# Patient Record
Sex: Female | Born: 1955
Health system: Southern US, Community
[De-identification: ages and names within clinical notes are randomized; demographics above are authoritative.]

## PROBLEM LIST (undated history)

## (undated) DIAGNOSIS — R002 Palpitations: Secondary | ICD-10-CM

## (undated) DIAGNOSIS — F319 Bipolar disorder, unspecified: Secondary | ICD-10-CM

## (undated) DIAGNOSIS — F4310 Post-traumatic stress disorder, unspecified: Secondary | ICD-10-CM

## (undated) DIAGNOSIS — E119 Type 2 diabetes mellitus without complications: Secondary | ICD-10-CM

## (undated) DIAGNOSIS — I10 Essential (primary) hypertension: Secondary | ICD-10-CM

## (undated) DIAGNOSIS — K279 Peptic ulcer, site unspecified, unspecified as acute or chronic, without hemorrhage or perforation: Secondary | ICD-10-CM

## (undated) DIAGNOSIS — F32A Depression, unspecified: Secondary | ICD-10-CM

## (undated) DIAGNOSIS — K2951 Unspecified chronic gastritis with bleeding: Secondary | ICD-10-CM

## (undated) DIAGNOSIS — R51 Headache: Secondary | ICD-10-CM

## (undated) DIAGNOSIS — F329 Major depressive disorder, single episode, unspecified: Secondary | ICD-10-CM

## (undated) HISTORY — DX: Depression, unspecified: F32.A

## (undated) HISTORY — DX: Major depressive disorder, single episode, unspecified: F32.9

## (undated) HISTORY — PX: KNEE SURGERY: SHX244

## (undated) HISTORY — DX: Peptic ulcer, site unspecified, unspecified as acute or chronic, without hemorrhage or perforation: K27.9

## (undated) HISTORY — PX: BREAST BIOPSY: SHX20

## (undated) HISTORY — DX: Palpitations: R00.2

## (undated) HISTORY — PX: SHOULDER SURGERY: SHX246

## (undated) HISTORY — DX: Headache: R51

## (undated) HISTORY — DX: Essential (primary) hypertension: I10

## (undated) HISTORY — PX: TONSILLECTOMY: SUR1361

## (undated) HISTORY — DX: Post-traumatic stress disorder, unspecified: F43.10

## (undated) HISTORY — DX: Bipolar disorder, unspecified: F31.9

---

## 2006-11-24 ENCOUNTER — Ambulatory Visit: Payer: Self-pay | Admitting: Cardiology

## 2006-11-25 ENCOUNTER — Ambulatory Visit: Payer: Self-pay

## 2007-01-14 DIAGNOSIS — F339 Major depressive disorder, recurrent, unspecified: Secondary | ICD-10-CM | POA: Insufficient documentation

## 2007-01-14 DIAGNOSIS — G894 Chronic pain syndrome: Secondary | ICD-10-CM | POA: Insufficient documentation

## 2007-01-14 DIAGNOSIS — H8109 Meniere's disease, unspecified ear: Secondary | ICD-10-CM | POA: Insufficient documentation

## 2007-01-14 DIAGNOSIS — Z8711 Personal history of peptic ulcer disease: Secondary | ICD-10-CM | POA: Insufficient documentation

## 2007-01-14 DIAGNOSIS — J309 Allergic rhinitis, unspecified: Secondary | ICD-10-CM | POA: Insufficient documentation

## 2007-01-14 DIAGNOSIS — I10 Essential (primary) hypertension: Secondary | ICD-10-CM | POA: Insufficient documentation

## 2007-02-06 ENCOUNTER — Ambulatory Visit: Payer: Self-pay | Admitting: Cardiology

## 2007-03-01 DIAGNOSIS — M545 Low back pain, unspecified: Secondary | ICD-10-CM | POA: Insufficient documentation

## 2008-11-03 ENCOUNTER — Emergency Department: Payer: Self-pay | Admitting: Emergency Medicine

## 2008-11-11 ENCOUNTER — Emergency Department: Payer: Self-pay | Admitting: Emergency Medicine

## 2009-03-26 ENCOUNTER — Ambulatory Visit: Payer: Self-pay | Admitting: Family Medicine

## 2009-04-07 ENCOUNTER — Encounter: Payer: Self-pay | Admitting: Cardiovascular Disease

## 2009-04-07 LAB — CONVERTED CEMR LAB
ALT: 19 units/L
Albumin: 4.3 g/dL
Alkaline Phosphatase: 67 units/L
BUN: 11 mg/dL
Basophils Relative: 1 %
CO2: 26 meq/L
Chloride: 102 meq/L
Creatinine, Ser: 0.78 mg/dL
Eosinophils Relative: 3 %
HCT: 43.3 %
Monocytes Relative: 10 %
Neutrophils Relative %: 50 %
RBC: 4.78 M/uL
RDW: 11.9 %
Total Bilirubin: 0.6 mg/dL
WBC: 6.3 10*3/uL

## 2009-04-10 LAB — HPV, HIGH-RISK: HPV DNA High Risk: ABNORMAL

## 2009-04-10 LAB — HM PAP SMEAR: HM PAP: ABNORMAL

## 2009-05-09 ENCOUNTER — Ambulatory Visit: Payer: Self-pay | Admitting: Internal Medicine

## 2009-05-24 ENCOUNTER — Ambulatory Visit: Payer: Self-pay | Admitting: Family Medicine

## 2009-06-30 ENCOUNTER — Encounter: Payer: Self-pay | Admitting: Cardiovascular Disease

## 2009-06-30 ENCOUNTER — Emergency Department: Payer: Self-pay | Admitting: Emergency Medicine

## 2009-06-30 LAB — CONVERTED CEMR LAB
CO2: 31 meq/L
Calcium: 8.8 mg/dL
Chloride: 103 meq/L
Creatinine, Ser: 0.81 mg/dL
HCT: 41.5 %
MCV: 91 fL
Potassium: 3.5 meq/L
RDW: 12.2 %
Sodium: 141 meq/L

## 2009-07-06 ENCOUNTER — Ambulatory Visit: Payer: Self-pay | Admitting: Family Medicine

## 2009-07-11 ENCOUNTER — Encounter: Payer: Self-pay | Admitting: Cardiovascular Disease

## 2009-07-11 DIAGNOSIS — R0602 Shortness of breath: Secondary | ICD-10-CM | POA: Insufficient documentation

## 2009-07-13 ENCOUNTER — Ambulatory Visit: Payer: Self-pay | Admitting: Cardiology

## 2009-07-13 DIAGNOSIS — R079 Chest pain, unspecified: Secondary | ICD-10-CM | POA: Insufficient documentation

## 2009-07-14 ENCOUNTER — Encounter: Payer: Self-pay | Admitting: Cardiology

## 2009-08-08 ENCOUNTER — Telehealth (INDEPENDENT_AMBULATORY_CARE_PROVIDER_SITE_OTHER): Payer: Self-pay | Admitting: Radiology

## 2010-01-18 ENCOUNTER — Emergency Department: Payer: Self-pay | Admitting: Emergency Medicine

## 2010-01-31 ENCOUNTER — Ambulatory Visit: Payer: Self-pay | Admitting: Family Medicine

## 2010-04-15 ENCOUNTER — Ambulatory Visit: Payer: Self-pay | Admitting: Internal Medicine

## 2010-04-17 NOTE — Assessment & Plan Note (Signed)
Summary: ROV/AMD   Visit Type:  Follow-up Primary Provider:  Dr. Nilda Simmer  CC:  SOB (constant); some CP.  History of Present Illness: 55 yo with history of bipolar disorder, atypical chest pain, and palpitations presents for evaluation of chest pain.  She has been having episodes of chest pain for over a month now.  She feels like it is a soreness with a choking sensation that is there constantly though it waxes and wanes.  She went to the ER at [facility] about 10 days ago.  ECG was unremarkable and cardiac enyzmes were normal. She has had a chest CTA that was negative for PE or pneumonia.  The chest soreness is not worse with exertion or meals.  She wheezes occasionally and has been using albuterol, which may be helping some.  She had a prior similar episode back in 2008.  At that time, she had an unremarkable echo and was supposed to have a myoview done but apparently did not followup.  She continues to have occasional chest fluttering.  She had a PVC on ECG today and had a past holter with PVCs.  She quit smoking in 1/11 and reports a nearly 40 lb weight gain since that time.   ECG: NSR with PVC  Labs (4/11): HCT 43, K 4.2, creatinine 0.78, LDL 145, HDL 56  Current Medications (verified): 1)  Clonazepam 1 Mg Tabs (Clonazepam) .Marland Kitchen.. 1-2 Daily & 2 At Bedtime 2)  Abilify 15 Mg Tabs (Aripiprazole) .... At Bedtime 3)  Simvastatin 20 Mg Tabs (Simvastatin) .... Take One Tablet By Mouth Daily At Bedtime 4)  Citalopram Hydrobromide 20 Mg Tabs (Citalopram Hydrobromide) .... Once Daily 5)  Loratadine-D 24hr 10-240 Mg Xr24h-Tab (Loratadine-Pseudoephedrine) .... Once Daily 6)  Flonase 50 Mcg/act Susp (Fluticasone Propionate) .... As Directed 7)  Diclofenac Sodium 75 Mg Tbec (Diclofenac Sodium) .... As Directed 8)  Ranitidine Hcl 150 Mg Caps (Ranitidine Hcl) .Marland Kitchen.. 1 By Mouth Two Times A Day 9)  Proventil Hfa 108 (90 Base) Mcg/act Aers (Albuterol Sulfate) .... As Directed 10)  Dexilant 60 Mg Cpdr  (Dexlansoprazole) .... Once Daily 11)  Vitamin B Complex-C   Caps (B Complex-C) .... Once in Awhile 12)  Aspirin Ec 325 Mg Tbec (Aspirin) .... Take Only With Cp  Allergies (verified): 1)  ! Pcn  Past History:  Past Medical History: 1. Allergic rhinitis 2. osteoarthritis 3. depression 4. headaches 5. hypertension 6. hyperlipidemia 7. DDD lumber spine 8. menopause 2009 9. Palpitations: holter monitor in 2008 showed PVCs 10. PTSD 11. Atypical chest pain: Worked up in 2008 by Dr. Jens Som, was supposed to get [facility] but apparently did not followup.  Echo showed EF 60%, mild LVH, normal valves 12. Bipolar disorder 13. GERD  Family History: Father: living 68: cancer Mother: Died at 54 after MI.  Sister with palpitations  Social History: Disabled (back pain) Originally from [place] Has a daughter Tobacco Use - Former, quit in 1/11.  Alcohol Use - yes Regular Exercise - no  Review of Systems       All systems reviewed and negative except as per HPI.   Vital Signs:  Patient profile:   55 year old female Height:      68 inches Weight:      245 pounds BMI:     37.39 O2 Sat:      98 % on Room air Pulse rate:   66 / minute BP sitting:   144 / 82  (left arm) Cuff size:  regular  Vitals Entered By: Charlena Cross, RN, BSN (July 13, 2009 2:44 PM)  O2 Flow:  Room air  Physical Exam  General:  Well developed, well nourished, in no acute distress. Obese. Head:  normocephalic and atraumatic Nose:  no deformity, discharge, inflammation, or lesions Mouth:  Teeth, gums and palate normal. Oral mucosa normal. Neck:  Neck supple, no JVD. No masses, thyromegaly or abnormal cervical nodes. Lungs:  Clear bilaterally to auscultation and percussion. Heart:  Non-displaced PMI, chest non-tender; regular rate and rhythm, S1, S2 without murmurs, rubs or gallops. Carotid upstroke normal, no bruit. Pedals normal pulses. No edema, no varicosities. Abdomen:  Bowel sounds positive;  abdomen soft and non-tender without masses, organomegaly, or hernias noted. No hepatosplenomegaly. Msk:  Back normal, normal gait. Muscle strength and tone normal. Extremities:  No clubbing or cyanosis. Neurologic:  Alert and oriented x 3. Skin:  Intact without lesions or rashes. Psych:  Normal affect.   Impression & Recommendations:  Problem # 1:  CHEST PAIN-UNSPECIFIED (ICD-786.50) Atypical chest pain.  Risk factors are recent smoking (quit 1/11), hyperlipidemia, and HTN.  She had a similar episode in 2008 and was supposed to have a myoview but did not followup.  Given improvement in symptoms with albuterol, I wonder if there could be a component of allergic asthma.  I do think that it would be reasonable to risk stratify her.  Will get an ETT-myoview.  She should take ASA 81 mg daily (can stop if myoview is normal).    Problem # 2:  HYPERTENSION, BENIGN (ICD-401.1) Carries diagnosis of HTN but no meds.  BP is mildly elevated today.  Needs careful followup.   Other Orders: Nuclear Stress Test (Nuc Stress Test)  Patient Instructions: 1)  Your physician recommends that you schedule a follow-up appointment in: 2 weeks with Dr. Mariah Milling 2)  Your physician has recommended you make the following change in your medication: aspirin 81 mg daily 3)  Your physician has requested that you have an exercise stress myoview.  For further information please visit [email or web address]  Please follow instruction sheet, as given.

## 2010-04-17 NOTE — Progress Notes (Signed)
Summary: Cancelling Myoview Study  Phone Note Call from Patient   Caller: Patient Summary of Call: Pt. called today and cancelled Myoview sched. for 08/09/09 due to illness. She stated she would call back to resched. when better. She also cancelled the F/U appt. with Dr. Mariah Milling on 5/31 and said that she would call back to resched. that appts. also.      W.Trenity Pha, RT-N Initial call taken by: Leonia Corona, RT-N,  Aug 08, 2009 1:30 PM

## 2010-04-18 ENCOUNTER — Ambulatory Visit: Payer: Self-pay | Admitting: Family Medicine

## 2010-05-23 ENCOUNTER — Ambulatory Visit: Payer: Self-pay | Admitting: Family Medicine
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
## 2010-08-27 ENCOUNTER — Encounter: Payer: Self-pay | Admitting: Cardiovascular Disease

## 2011-01-03 ENCOUNTER — Ambulatory Visit: Payer: Self-pay | Admitting: Family Medicine

## 2011-05-09 ENCOUNTER — Ambulatory Visit: Payer: Self-pay | Admitting: Internal Medicine

## 2011-05-11 LAB — BETA STREP CULTURE(ARMC)

## 2011-08-28 ENCOUNTER — Ambulatory Visit: Payer: Self-pay | Admitting: Family Medicine

## 2011-08-30 ENCOUNTER — Emergency Department: Payer: Self-pay | Admitting: Unknown Physician Specialty

## 2011-08-30 LAB — COMPREHENSIVE METABOLIC PANEL
Anion Gap: 12 (ref 7–16)
BUN: 12 mg/dL (ref 7–18)
Bilirubin,Total: 0.4 mg/dL (ref 0.2–1.0)
Calcium, Total: 8.6 mg/dL (ref 8.5–10.1)
Chloride: 107 mmol/L (ref 98–107)
Co2: 21 mmol/L (ref 21–32)
Creatinine: 0.82 mg/dL (ref 0.60–1.30)
EGFR (African American): >60
Glucose: 137 mg/dL — ABNORMAL HIGH (ref 65–99)
Osmolality: 281 (ref 275–301)
Potassium: 3.6 mmol/L (ref 3.5–5.1)
SGOT(AST): 23 U/L (ref 15–37)
SGPT (ALT): 48 U/L

## 2011-08-30 LAB — MAGNESIUM: Magnesium: 1.8 mg/dL

## 2011-08-30 LAB — CBC
HCT: 43.4 % (ref 35.0–47.0)
HGB: 15 g/dL (ref 12.0–16.0)
MCH: 31.7 pg (ref 26.0–34.0)
MCHC: 34.5 g/dL (ref 32.0–36.0)
Platelet: 236 10*3/uL (ref 150–440)
RDW: 12.4 % (ref 11.5–14.5)

## 2011-08-30 LAB — CK TOTAL AND CKMB (NOT AT ARMC)
CK, Total: 89 U/L (ref 21–215)
CK-MB: 1.6 ng/mL (ref 0.5–3.6)

## 2011-08-30 LAB — APTT: Activated PTT: 28.6 secs (ref 23.6–35.9)

## 2011-08-30 LAB — LIPASE, BLOOD: Lipase: 65 U/L — ABNORMAL LOW (ref 73–393)

## 2011-09-01 ENCOUNTER — Inpatient Hospital Stay: Payer: Self-pay | Admitting: Internal Medicine

## 2011-09-01 LAB — COMPREHENSIVE METABOLIC PANEL
Alkaline Phosphatase: 78 U/L (ref 50–136)
Bilirubin,Total: 0.4 mg/dL (ref 0.2–1.0)
Calcium, Total: 8.8 mg/dL (ref 8.5–10.1)
Co2: 25 mmol/L (ref 21–32)
Creatinine: 0.73 mg/dL (ref 0.60–1.30)
Glucose: 159 mg/dL — ABNORMAL HIGH (ref 65–99)
Osmolality: 285 (ref 275–301)
SGOT(AST): 23 U/L (ref 15–37)
SGPT (ALT): 43 U/L
Total Protein: 7 g/dL (ref 6.4–8.2)

## 2011-09-01 LAB — CBC
HCT: 42.4 % (ref 35.0–47.0)
HGB: 14.9 g/dL (ref 12.0–16.0)
MCHC: 35.2 g/dL (ref 32.0–36.0)
MCV: 91 fL (ref 80–100)
Platelet: 237 10*3/uL (ref 150–440)
RDW: 12.6 % (ref 11.5–14.5)

## 2011-09-01 LAB — CK TOTAL AND CKMB (NOT AT ARMC)
CK, Total: 65 U/L (ref 21–215)
CK-MB: 1.4 ng/mL (ref 0.5–3.6)

## 2011-09-02 LAB — CBC WITH DIFFERENTIAL/PLATELET
Basophil %: 0.6 %
Eosinophil %: 0.1 %
HCT: 43.9 % (ref 35.0–47.0)
HGB: 15.1 g/dL (ref 12.0–16.0)
MCH: 32 pg (ref 26.0–34.0)
MCHC: 34.5 g/dL (ref 32.0–36.0)
MCV: 93 fL (ref 80–100)
Monocyte %: 2.9 %
Neutrophil %: 82 %
Platelet: 234 10*3/uL (ref 150–440)
RBC: 4.74 10*6/uL (ref 3.80–5.20)
WBC: 8.1 10*3/uL (ref 3.6–11.0)

## 2011-09-02 LAB — BASIC METABOLIC PANEL
BUN: 13 mg/dL (ref 7–18)
Calcium, Total: 8.7 mg/dL (ref 8.5–10.1)
Co2: 26 mmol/L (ref 21–32)
Creatinine: 1.01 mg/dL (ref 0.60–1.30)
EGFR (African American): >60
Glucose: 250 mg/dL — ABNORMAL HIGH (ref 65–99)
Osmolality: 290 (ref 275–301)
Sodium: 141 mmol/L (ref 136–145)

## 2011-09-02 LAB — TROPONIN I
Troponin-I: <0.02 ng/mL
Troponin-I: <0.02 ng/mL

## 2011-09-02 LAB — LIPID PANEL
Cholesterol: 183 mg/dL (ref 0–200)
Triglycerides: 214 mg/dL — ABNORMAL HIGH (ref 0–200)
VLDL Cholesterol, Calc: 43 mg/dL — ABNORMAL HIGH (ref 5–40)

## 2011-10-23 ENCOUNTER — Encounter: Payer: Self-pay | Admitting: Pulmonary Disease

## 2011-10-24 ENCOUNTER — Institutional Professional Consult (permissible substitution): Payer: Self-pay | Admitting: Pulmonary Disease

## 2011-11-27 ENCOUNTER — Telehealth: Payer: Self-pay

## 2011-11-27 NOTE — Telephone Encounter (Signed)
Patient is calling to get a refill of losartan 50 mg tabs that she takes once daily. She is a patient of Dr. Kirtland Bouchard. [facility] but has never been seen at our office. I informed her that I was unsure of whether or not we could help her because she hasn't been seen here and she understood. Her pharmacy gave her 3 tabs and she has 1 left. She uses [facility] in [place] and can be reached at [phone].

## 2011-11-28 NOTE — Telephone Encounter (Signed)
Please call ---- I am unable to provide refills until patient has appointment at [facility]; she should call [facility] for refills; they are happy to provide refills until she establishes here.  KMS

## 2011-11-28 NOTE — Telephone Encounter (Signed)
I have called patient to advise. She said the pharmacy has been advised to send request there, and  She will call the office in [place] also.

## 2012-07-21 ENCOUNTER — Other Ambulatory Visit: Payer: Self-pay | Admitting: Family Medicine

## 2012-07-25 ENCOUNTER — Ambulatory Visit: Payer: Self-pay | Admitting: Emergency Medicine

## 2012-07-27 LAB — BETA STREP CULTURE(ARMC)

## 2012-09-02 ENCOUNTER — Ambulatory Visit: Payer: Self-pay | Admitting: Family Medicine

## 2013-04-27 ENCOUNTER — Ambulatory Visit: Payer: Self-pay | Admitting: Family Medicine

## 2013-04-28 ENCOUNTER — Ambulatory Visit: Payer: Self-pay | Admitting: Family Medicine

## 2013-06-02 LAB — HM PAP SMEAR: HM PAP: NEGATIVE

## 2013-12-29 DIAGNOSIS — R7689 Other specified abnormal immunological findings in serum: Secondary | ICD-10-CM | POA: Insufficient documentation

## 2013-12-29 DIAGNOSIS — R768 Other specified abnormal immunological findings in serum: Secondary | ICD-10-CM | POA: Insufficient documentation

## 2013-12-29 DIAGNOSIS — M1990 Unspecified osteoarthritis, unspecified site: Secondary | ICD-10-CM | POA: Insufficient documentation

## 2014-03-20 ENCOUNTER — Ambulatory Visit: Payer: Self-pay | Admitting: Physician Assistant

## 2014-03-20 DIAGNOSIS — J329 Chronic sinusitis, unspecified: Secondary | ICD-10-CM | POA: Diagnosis not present

## 2014-03-20 DIAGNOSIS — E780 Pure hypercholesterolemia: Secondary | ICD-10-CM | POA: Diagnosis not present

## 2014-03-20 DIAGNOSIS — J40 Bronchitis, not specified as acute or chronic: Secondary | ICD-10-CM | POA: Diagnosis not present

## 2014-03-20 DIAGNOSIS — F17210 Nicotine dependence, cigarettes, uncomplicated: Secondary | ICD-10-CM | POA: Diagnosis not present

## 2014-03-20 DIAGNOSIS — F319 Bipolar disorder, unspecified: Secondary | ICD-10-CM | POA: Diagnosis not present

## 2014-03-20 DIAGNOSIS — I10 Essential (primary) hypertension: Secondary | ICD-10-CM | POA: Diagnosis not present

## 2014-05-10 ENCOUNTER — Ambulatory Visit: Payer: Self-pay | Admitting: Internal Medicine

## 2014-05-10 DIAGNOSIS — Y999 Unspecified external cause status: Secondary | ICD-10-CM | POA: Diagnosis not present

## 2014-05-10 DIAGNOSIS — Y929 Unspecified place or not applicable: Secondary | ICD-10-CM | POA: Diagnosis not present

## 2014-05-10 DIAGNOSIS — F329 Major depressive disorder, single episode, unspecified: Secondary | ICD-10-CM | POA: Diagnosis not present

## 2014-05-10 DIAGNOSIS — Z79899 Other long term (current) drug therapy: Secondary | ICD-10-CM | POA: Diagnosis not present

## 2014-05-10 DIAGNOSIS — M25572 Pain in left ankle and joints of left foot: Secondary | ICD-10-CM | POA: Diagnosis not present

## 2014-05-10 DIAGNOSIS — F319 Bipolar disorder, unspecified: Secondary | ICD-10-CM | POA: Diagnosis not present

## 2014-05-10 DIAGNOSIS — G8929 Other chronic pain: Secondary | ICD-10-CM | POA: Diagnosis not present

## 2014-05-10 DIAGNOSIS — M25562 Pain in left knee: Secondary | ICD-10-CM | POA: Diagnosis not present

## 2014-05-10 DIAGNOSIS — Y9389 Activity, other specified: Secondary | ICD-10-CM | POA: Diagnosis not present

## 2014-05-20 DIAGNOSIS — Z0000 Encounter for general adult medical examination without abnormal findings: Secondary | ICD-10-CM | POA: Diagnosis not present

## 2014-05-20 DIAGNOSIS — J449 Chronic obstructive pulmonary disease, unspecified: Secondary | ICD-10-CM | POA: Diagnosis not present

## 2014-05-20 DIAGNOSIS — R7309 Other abnormal glucose: Secondary | ICD-10-CM | POA: Diagnosis not present

## 2014-05-20 DIAGNOSIS — Z23 Encounter for immunization: Secondary | ICD-10-CM | POA: Diagnosis not present

## 2014-05-20 DIAGNOSIS — L918 Other hypertrophic disorders of the skin: Secondary | ICD-10-CM | POA: Diagnosis not present

## 2014-05-20 DIAGNOSIS — I10 Essential (primary) hypertension: Secondary | ICD-10-CM | POA: Diagnosis not present

## 2014-05-20 DIAGNOSIS — E780 Pure hypercholesterolemia: Secondary | ICD-10-CM | POA: Diagnosis not present

## 2014-06-08 DIAGNOSIS — Z0000 Encounter for general adult medical examination without abnormal findings: Secondary | ICD-10-CM | POA: Diagnosis not present

## 2014-06-08 DIAGNOSIS — R109 Unspecified abdominal pain: Secondary | ICD-10-CM | POA: Diagnosis not present

## 2014-06-08 DIAGNOSIS — E780 Pure hypercholesterolemia: Secondary | ICD-10-CM | POA: Diagnosis not present

## 2014-06-08 DIAGNOSIS — Z23 Encounter for immunization: Secondary | ICD-10-CM | POA: Diagnosis not present

## 2014-06-08 DIAGNOSIS — R7309 Other abnormal glucose: Secondary | ICD-10-CM | POA: Diagnosis not present

## 2014-06-08 DIAGNOSIS — R5383 Other fatigue: Secondary | ICD-10-CM | POA: Diagnosis not present

## 2014-06-08 DIAGNOSIS — L918 Other hypertrophic disorders of the skin: Secondary | ICD-10-CM | POA: Diagnosis not present

## 2014-06-09 DIAGNOSIS — R7309 Other abnormal glucose: Secondary | ICD-10-CM | POA: Diagnosis not present

## 2014-06-09 DIAGNOSIS — R5383 Other fatigue: Secondary | ICD-10-CM | POA: Diagnosis not present

## 2014-06-09 DIAGNOSIS — Z1322 Encounter for screening for metabolic disorder: Secondary | ICD-10-CM | POA: Diagnosis not present

## 2014-06-09 DIAGNOSIS — R109 Unspecified abdominal pain: Secondary | ICD-10-CM | POA: Diagnosis not present

## 2014-06-09 LAB — BASIC METABOLIC PANEL
BUN: 11 mg/dL (ref 4–21)
CREATININE: 0.8 mg/dL (ref 0.5–1.1)
GLUCOSE: 144 mg/dL
POTASSIUM: 4 mmol/L (ref 3.4–5.3)
SODIUM: 142 mmol/L (ref 137–147)

## 2014-06-09 LAB — HEMOGLOBIN A1C: HEMOGLOBIN A1C: 6.9 % — AB (ref 4.0–6.0)

## 2014-06-09 LAB — CBC AND DIFFERENTIAL
HEMATOCRIT: 44 % (ref 36–46)
Hemoglobin: 15.1 g/dL (ref 12.0–16.0)
Platelets: 180 10*3/uL (ref 150–399)
WBC: 7 10^3/mL

## 2014-06-09 LAB — HEPATIC FUNCTION PANEL
ALT: 31 U/L (ref 7–35)
AST: 21 U/L (ref 13–35)

## 2014-06-09 LAB — LIPID PANEL
Cholesterol: 185 mg/dL (ref 0–200)
HDL: 37 mg/dL (ref 35–70)
LDL CALC: 120 mg/dL
TRIGLYCERIDES: 139 mg/dL (ref 40–160)

## 2014-06-09 LAB — TSH: TSH: 2.29 u[IU]/mL (ref 0.41–5.90)

## 2014-07-01 ENCOUNTER — Ambulatory Visit
Admit: 2014-07-01 | Disposition: A | Discharge status: Home / Self Care | Payer: Self-pay | Attending: Family Medicine | Admitting: Family Medicine

## 2014-07-01 DIAGNOSIS — E119 Type 2 diabetes mellitus without complications: Secondary | ICD-10-CM | POA: Diagnosis not present

## 2014-07-10 NOTE — H&P (Signed)
PATIENT NAME:  Misty Bishop, Misty Bishop MR#:  [id number] DATE OF BIRTH:  06-10-1955  DATE OF ADMISSION:  09/01/2011  PRIMARY CARE PHYSICIAN: Reginia Forts, MD  CHIEF COMPLAINT: Blood pressure up and down.   HISTORY OF PRESENT ILLNESS: This is a 59 year old female who has been having chest pain in bilateral chest, like a band across her chest and around to the back. It has been coming and going for the past month. Nothing makes it better or worse. The left side seems more constant than the other. She has been having dizziness, a headache, recent upper respiratory tract infection, and started on prednisone and doxycycline. She was recently in the emergency room and had a CT scan of the chest, abdomen, and pelvis which was unrevealing. She has been taking some Benadryl, Mucinex D, and some pseudoephedrine, but has not been taking pseudoephedrine since Thursday. She has been having some palpitations, some cough, shortness of breath, and wheezing. In the ER, she passed out and hospitalist services were contacted for further evaluation.   PAST MEDICAL HISTORY:  1. Chronic back pain and neck pain. 2. Hypertension. 3. History of bleeding ulcer in the past.   PAST SURGICAL HISTORY:  1. Cesarean section.  2. Tonsillectomy.  3. Lumpectomy, left breast, benign.  4. Left shoulder surgery. 5. Knee surgery.   ALLERGIES: Penicillin and Levaquin.   MEDICATIONS:  1. Loratadine 10 mg daily p.r.n.  2. Ranitidine 150 mg twice a day p.r.n.  3. Diclofenac p.r.n.  4. Prednisolone taper. 5. Doxycycline 100 mg twice a day. 6. Guaifenesin p.r.n.   SOCIAL HISTORY: Smoker - 3/4 pack for the past year. No alcohol. No drug use. Is on disability.   FAMILY HISTORY: Mother with heart disease, diabetes, and chronic obstructive pulmonary disease. Father died of metastatic melanoma.   REVIEW OF SYSTEMS: CONSTITUTIONAL: Positive for headache. Positive for sweats. No fever or chills. Positive for fatigue for the past six weeks.  EYES: She does wear glasses and has light sensitivity and some blurred vision. EARS, NOSE, MOUTH, AND THROAT: Decreased hearing. Positive for congestion in the nose. Positive for sore throat. CARDIOVASCULAR: Positive for chest pain. Positive for palpitations. RESPIRATORY: Positive for shortness of breath, cough, and clear phlegm. Positive for wheezing. GASTROINTESTINAL: Positive for nausea here in the emergency room. Upper abdominal pain. Positive for constipation. No bright red blood per rectum. No melena. GENITOURINARY: No burning on urination or hematuria. MUSCULOSKELETAL: Positive for joint pain all over, left arm and neck especially. INTEGUMENT: No rashes or eruptions. NEUROLOGIC: Positive for passing out in the emergency room. PSYCHIATRIC: Positive for anxiety. ENDOCRINE: No thyroid problems. HEMATOLOGIC/LYMPHATIC: No anemia. No easy bruising or bleeding.   PHYSICAL EXAMINATION:   VITAL SIGNS: Temperature 98.7, pulse 70, respirations 18, blood pressure 164/88, and pulse oximetry 96%. On presentation though the blood pressure was 178/119.   GENERAL: No respiratory distress.   EYES: Conjunctivae and lids normal. Pupils equal, round, and reactive to light. Extraocular muscles intact.   EARS, NOSE, MOUTH, AND THROAT: Tympanic membrane on the right bulging and cloudy. Tympanic membrane on the left erythematous. Nasal mucosa with enlarged turbinates. Throat with slight erythema, no exudates. Lips and gums with no lesions.   NECK: No JVD. No bruits. No lymphadenopathy. No thyromegaly. No thyroid nodules palpated.   RESPIRATORY: Lungs have decreased breath sounds bilaterally. Coughing with deep breath. Scattered wheeze and rhonchi.   HEART: S1 and S2 normal No gallops, rubs, or murmurs heard. Carotid upstroke 2+ bilaterally. No bruits. Dorsalis pedis pulses 2+  bilaterally. No edema of the lower extremities.   ABDOMEN: Soft. Tenderness in the upper abdomen.   MUSCULOSKELETAL: Positive for pain to  palpation over the back, from the thoracic spine around, mostly on left side, coming around the left breast, underneath the left breast.   SKIN: No rashes.   NEUROLOGIC: Cranial nerves II through XII grossly intact. Deep tendon reflexes 2+ bilateral lower extremities.   PSYCHIATRIC: The patient is oriented to person, place, and time.   LABORATORY, DIAGNOSTIC AND RADIOLOGIC DATA: White blood cell count 7.9, hemoglobin and hematocrit 14.9 and 42.4, and platelet count 237. Glucose 159, BUN 13, creatinine 0.73, sodium 141, potassium 4.0, chloride 108, CO2 25, and calcium 8.8. Liver function tests normal range. Troponin negative.   Chest x-ray: Findings consistent with chronic obstructive pulmonary disease.  EKG: Normal sinus rhythm, sinus arrhythmia, 64 beats per minute, flipped T waves laterally.   ASSESSMENT AND PLAN:  1. Malignant hypertension: The patient did get a dose of labetalol in the emergency room. Blood pressure is still elevated. We will start Norvasc 5 mg daily and titrate if needed.  2. Back pain, chest pain, and abdominal pain: This is probably thoracic spine related. We will get a MRI of the thoracic spine. We will put on IV Solu-Medrol to break down any inflammation.  3. Upper respiratory tract infection and chronic obstructive pulmonary disease exacerbation: We will put on nebulizer treatments. The patient is already on doxycycline. We will add Solu-Medrol.  4. Anxiety.  5. Gastroesophageal reflux disease: PRN ranitidine.  6. Tobacco abuse: Smoking cessation counseling done, three minutes by me. Nicotine patch applied.   TIME SPENT ON ADMISSION: 55 minutes.  ____________________________ Tana Conch. Leslye Peer, MD rjw:slb D: 09/01/2011 20:16:55 ET T: 09/02/2011 07:46:41 ET JOB#: [id number]  cc: Tana Conch. Leslye Peer, MD, <Dictator> Renette Butters. Tamala Julian, MD Marisue Brooklyn MD ELECTRONICALLY SIGNED 09/02/2011 19:47

## 2014-07-10 NOTE — Discharge Summary (Signed)
PATIENT NAME:  Misty Bishop, OLDS MR#:  [id number] DATE OF BIRTH:  07/10/1955  DATE OF ADMISSION:  09/01/2011 DATE OF DISCHARGE:  09/03/2011  PRESENTING COMPLAINT: Back pain, chest pain, and elevated blood pressure.   DISCHARGE DIAGNOSES:  1. Accelerated hypertension, new onset.  2. Back pain, chronic.  3. Upper respiratory tract infection with ongoing antibiotic treatment as outpatient.  4. Hyperglycemia, appears steroid induced. Hemoglobin A1c is 5.7.   PROCEDURE: Myoview stress test negative for ischemia, EF of 60%.   MEDICATIONS: 1. Ranitidine 150 mg b.i.d.  2. Loratadine 10 mg daily.  3. Flonase 50 mcg/inhalation one spray twice a day.  4. Prednisolone, computer taper.  5. Doxycycline 1 capsule b.i.d., complete your outpatient medication.  6. Hydrocodone/acetaminophen 5/500 1 to 2 q.4 p.r.n.  7. Guaifenesin 1200 mg b.i.d.  8. Klonopin 0.5 mg p.o. 3 times a day.  9. Diclofenac sodium 75 mg delayed-release b.i.d.  10. Flexeril p.o. daily as needed, your home dose.  11. Symbicort 80/4.5 2 puffs b.i.d.  12. Albuterol ipratropium one puff 4 times a day as needed.  13. Norvasc 10 mg daily.   FOLLOW-UP: Follow-up with Dr. Reginia Forts. Dr. Thompson Caul office will call you with the appointment.   LABORATORY, DIAGNOSTIC, AND RADIOLOGICAL DATA: Troponin x3 negative. Echo is within normal limits with EF of 55%.   MRI of the thoracic spine unremarkable.   Myoview is negative for ischemia.   CBC and basic metabolic panel within normal limits except glucose of 250.   CT of the abdomen and pelvis with contrast no acute disease of the chest, abdomen, or pelvis.   Lipase 65. Cholesterol panel triglyceride 214, LDL 85, cholesterol 183. Hemoglobin A1c 5.8.   BRIEF SUMMARY OF HOSPITAL COURSE: [name] is a 59 year old morbidly obese Caucasian female who was admitted with:  1. Malignant hypertension with chest pressure. The patient was admitted on the telemetry floor, started on p.o. Norvasc  and doses of p.r.n. labetalol which she received in the ER. Her cardiac enzymes remained negative. EKG did not show any acute changes. Blood pressure was stabilized. She got a Myoview stress test which was negative. She was advised weight reduction and low salt diet and exercise.  2. Chronic back pain with degenerative joint disease and generalized aches and pains. MRI thoracic spine was normal. P.r.n. pain meds were continued.  3. URTI. The patient will complete doxycycline and prednisone that was prescribed by her primary care physician as outpatient.  4. Anxiety. On Klonopin.  5. Gastroesophageal reflux disease. On Ranitidine.  6. Hyperglycemia, appears steroid use.  7. Tobacco abuse. The patient was advised on smoking cessation.   Hospital stay otherwise remained stable. The patient remained a FULL CODE.   TIME SPENT: 40 minutes.   ____________________________ Hart Rochester Posey Pronto, MD sap:drc D: 09/04/2011 14:25:13 ET T: 09/04/2011 15:22:03 ET JOB#: [id number]  cc: Sonita Michiels A. Posey Pronto, MD, <Dictator> Renette Butters. Tamala Julian, MD Ilda Basset MD ELECTRONICALLY SIGNED 09/07/2011 15:46

## 2014-07-10 NOTE — Consult Note (Signed)
PATIENT NAME:  Misty Bishop, Misty Bishop MR#:  [id number] DATE OF BIRTH:  May 25, 1955  DATE OF CONSULTATION:  09/02/2011  REFERRING PHYSICIAN:  Dr. Posey Pronto CONSULTING PHYSICIAN:  Corey Skains, MD  PRIMARY CARE PHYSICIAN: Reginia Forts, MD  REASON FOR CONSULTATION: Chest pain, abnormal EKG, hypertension, hyperlipidemia, and coronary artery disease.   CHIEF COMPLAINT:  "I have chest pain."  HISTORY OF PRESENT ILLNESS: This is a 59 year old female with known coronary artery disease diagnosed by CT scan, hypertension without medication, hyperlipidemia with medication, and who has had intermittent episodes of chest discomfort. Some of it is substernal and some of it is in her ribs. At that time the patient has had no evidence of congestive heart failure. The patient was admitted to the hospital with a normal troponin and CK-MB. EKG shows normal sinus rhythm with lateral true precordial T wave inversion possibly consistent with myocardial infarction. Therefore the patient additionally has been on nitroglycerin. The nitroglycerin has given her a headache and it has helped with her blood pressure which was very high likely secondary to pain. She has a lot of orthopedic pain as well. She has not had any other significant symptoms.  REVIEW OF SYSTEMS: The remainder of review of systems is negative for vision change, ringing in the ears, hearing loss, cough, congestion, heartburn, nausea, vomiting, diarrhea, bloody stools, stomach pain, extremity pain, leg weakness, cramping of the buttocks, known blood clots, headaches, blackouts, dizzy spells, nosebleeds, congestion, trouble swallowing, frequent urination, urination at night, muscle weakness, numbness, anxiety, depression, skin lesions, or skin rashes.   PAST MEDICAL HISTORY:  1. Hypertension.  2. Hyperlipidemia.  3. Coronary artery disease. 4. Orthopedic injuries.   FAMILY HISTORY: Mother had coronary artery disease and myocardial infarction.   SOCIAL  HISTORY: She smokes 1 pack per day. She denies alcohol use.   ALLERGIES: No known drug allergies.  CURRENT MEDICATIONS: As listed.  PHYSICAL EXAMINATION:   VITAL SIGNS: Blood pressure 162/72 bilaterally and heart rate 70 upright and reclining and regular.   GENERAL: She is a well appearing female in no acute distress.   HEENT: No icterus, thyromegaly, ulcers, hemorrhage, or xanthelasma.   HEART: Regular rate and rhythm. Normal S1 and S2 without murmur, gallop, or rub. Point of maximal impulse is normal size and placement. Carotid upstroke is normal without bruit. Jugular venous pressure is normal.   LUNGS: Lungs have few basilar crackles with normal respirations.   ABDOMEN: Soft and nontender without hepatosplenomegaly or masses. Abdominal aorta is normal size without bruit.   EXTREMITIES: 2+ bilateral pulses in dorsal, pedal, radial, and femoral arteries without lower extremity edema, cyanosis, clubbing, or ulcers.   NEUROLOGIC: She is oriented to time, place, and person with normal mood and affect.   ASSESSMENT: A 59 year old female with hypertension, hyperlipidemia, coronary artery disease and orthopedic injuries with atypical chest pain but abnormal EKG without evidence of myocardial infarction needing further treatment options.   RECOMMENDATIONS:  1. Lexiscan infusion EKG due to orthopedic injuries and concerns of chest pain, myocardial ischemia, and abnormal EKG. 2. Hypertension control and would consider ACE inhibitor or amlodipine for hypertension control with goal systolic blood pressure below 150 mm.  3. Continue treatment of lipid management with a goal LDL below 70 mg/dL with lovastatin.  4. Consider echocardiogram for LV systolic dysfunction and valvular heart disease causing current symptoms as well as abnormal EKG.  5. The patient was counseled on the discontinuation of tobacco abuse.  6. Further treatment options after above.  ____________________________ Darnell Level  Kelli Hope, MD bjk:[id number] D: 09/02/2011 14:09:56 ET T: 09/02/2011 14:28:57 ET JOB#: [id number]  cc: Corey Skains, MD, <Dictator> Corey Skains MD ELECTRONICALLY SIGNED 09/04/2011 10:40

## 2014-07-28 ENCOUNTER — Ambulatory Visit: Payer: Self-pay

## 2014-07-28 ENCOUNTER — Encounter: Payer: Self-pay | Admitting: *Deleted

## 2014-07-28 NOTE — Progress Notes (Deleted)
Subjective:     Patient ID: Misty Bishop, female   DOB: [birth year], 59 y.o.   MRN: [id number]  HPI   Review of Systems     Objective:   Physical Exam     Assessment:     ***    Plan:     ***

## 2014-07-28 NOTE — Progress Notes (Signed)
Pt cancelled her class today and rescheduled for series beginning Mon evenings August 22, 2014.

## 2014-08-04 ENCOUNTER — Ambulatory Visit: Payer: Self-pay

## 2014-08-11 ENCOUNTER — Ambulatory Visit: Payer: Self-pay

## 2014-08-19 ENCOUNTER — Telehealth: Payer: Self-pay | Admitting: Family Medicine

## 2014-08-19 ENCOUNTER — Other Ambulatory Visit: Payer: Self-pay | Admitting: Family Medicine

## 2014-08-19 DIAGNOSIS — E119 Type 2 diabetes mellitus without complications: Secondary | ICD-10-CM

## 2014-08-19 DIAGNOSIS — K219 Gastro-esophageal reflux disease without esophagitis: Secondary | ICD-10-CM

## 2014-08-19 MED ORDER — GLUCOSE BLOOD VI STRP: ORAL_STRIP | Status: DC

## 2014-08-19 MED ORDER — DEXLANSOPRAZOLE 60 MG PO CPDR: 120.0000 mg | DELAYED_RELEASE_CAPSULE | Freq: Every day | ORAL | Status: DC

## 2014-08-19 NOTE — Telephone Encounter (Signed)
-----   Message from Birdie Sons, MD sent at 08/18/2014  7:55 PM EDT ----- Requests refill for Dexilant 60mg  to [facility] ultra test strips

## 2014-08-19 NOTE — Telephone Encounter (Signed)
-----   Message from Birdie Sons, MD sent at 08/18/2014  7:55 PM EDT ----- Requests refill for Dexilant 60mg  to [facility] ultra test strips

## 2014-08-22 ENCOUNTER — Ambulatory Visit: Payer: Self-pay

## 2014-08-29 ENCOUNTER — Ambulatory Visit: Payer: Self-pay

## 2014-09-05 ENCOUNTER — Ambulatory Visit: Payer: Self-pay

## 2014-09-12 ENCOUNTER — Telehealth: Payer: Self-pay | Admitting: Family Medicine

## 2014-09-12 DIAGNOSIS — K219 Gastro-esophageal reflux disease without esophagitis: Secondary | ICD-10-CM

## 2014-09-12 MED ORDER — DEXLANSOPRAZOLE 60 MG PO CPDR: 60.0000 mg | DELAYED_RELEASE_CAPSULE | Freq: Every day | ORAL | Status: DC

## 2014-09-12 NOTE — Telephone Encounter (Signed)
Patient states that the dexlansoprazole (DEXILANT) 60 MG capsule should be for one a day instead of two a day. Insurance was rejecting the Rx because of the quantity. Patient is requesting you call into [employer] (mail order) for her dexlansoprazole (DEXILANT) 60 MG capsule and her Lancets are also needing a new Rx. Patient is needing Rx expedited due to her being out of med's.

## 2014-09-14 ENCOUNTER — Other Ambulatory Visit: Payer: Self-pay | Admitting: Family Medicine

## 2014-09-14 NOTE — Telephone Encounter (Signed)
Dexilant rx was sent on Tuesday. Need to know what brand of lancets she is using in order to send prescription for them.

## 2014-09-26 ENCOUNTER — Telehealth: Payer: Self-pay | Admitting: Family Medicine

## 2014-09-26 NOTE — Telephone Encounter (Signed)
Pt called back states the fax # is incorrect and the correct fax # is [phone] and needs to be faxed to attn: Sharyn Lull with [employer].  Pt also request a call when this is faxed to [facility]

## 2014-09-26 NOTE — Telephone Encounter (Signed)
Pt would like Dr. Caryn Section to write a letter stating that pt is disabled  and requires public transportation to get to her doctor appointments. Pt stated that she needs it now because her car broke down and needs transportation because she is disabled. Pt request that it be faxed to [phone] asap. I advised pt that it could take more than a couple days to do this and she requested to speak with the office manager. I advised the office manager was out of the office today and I could let her leave a message on her voicemail. Pt stated no she needs to speak with her stand in. I advised that there wasn't a stand in. Pt demanded to speak with a nurse. I advised that I could have a nurse return her call but they are currently with patients. Pt got louder and said let me speak to anyone but you. I apologized to pt and asked her to hold. While I was trying to find someone else to speak with her she hung up. Thanks TNP

## 2014-09-27 NOTE — Telephone Encounter (Signed)
Letter has been printed. Please fax as requested. Thanks

## 2014-09-27 NOTE — Telephone Encounter (Signed)
Letter has been faxed to number listed below. Patient has been advised.

## 2014-09-27 NOTE — Telephone Encounter (Signed)
Pt is requesting referral to dermatologist for skin tags.She saw you for this in March 2016

## 2014-10-06 DIAGNOSIS — R922 Inconclusive mammogram: Secondary | ICD-10-CM | POA: Diagnosis not present

## 2014-10-06 DIAGNOSIS — Z1231 Encounter for screening mammogram for malignant neoplasm of breast: Secondary | ICD-10-CM | POA: Diagnosis not present

## 2014-10-10 ENCOUNTER — Encounter: Payer: Self-pay | Admitting: Family Medicine

## 2014-11-03 ENCOUNTER — Other Ambulatory Visit: Payer: Self-pay | Admitting: Family Medicine

## 2014-11-03 NOTE — Telephone Encounter (Signed)
Patient is requesting 2 prescriptions. Refill request for Hydrocodone-Acetaminophen 5-325mg  Last filled by MD on- 05/20/2014 #45 x0 Last Appt: 06/08/2014 Next Appt: 11/08/2014 Please advise refill?

## 2014-11-03 NOTE — Telephone Encounter (Signed)
Pt contacted office for refill request on the following medications:  Hydrocodone-Acetaminophen 5-325mg .  Pt states she is completely out.  Pt states she need a Rx to get these local and also a Rx to send to [facility] mail order.  Pt has an appt 11/08/2014.  CB# [id number]

## 2014-11-04 ENCOUNTER — Telehealth: Payer: Self-pay

## 2014-11-04 MED ORDER — HYDROCODONE-ACETAMINOPHEN 5-325 MG PO TABS: 1.0000 | ORAL_TABLET | Freq: Four times a day (QID) | ORAL | Status: DC | PRN

## 2014-11-04 NOTE — Telephone Encounter (Signed)
I called patient to inform her that her rx for Hydrocodone/ Acetaminophen was ready for pick up. Patient advised me that she requested 2 prescriptions for this.  was supposed to have 2

## 2014-11-08 ENCOUNTER — Ambulatory Visit (INDEPENDENT_AMBULATORY_CARE_PROVIDER_SITE_OTHER): Payer: Commercial Managed Care - HMO | Admitting: Family Medicine

## 2014-11-08 ENCOUNTER — Encounter: Payer: Self-pay | Admitting: Family Medicine

## 2014-11-08 VITALS — BP 142/82 | HR 58 | Temp 98.8°F | Resp 16 | Ht 67.0 in | Wt 251.0 lb

## 2014-11-08 DIAGNOSIS — M2550 Pain in unspecified joint: Secondary | ICD-10-CM | POA: Insufficient documentation

## 2014-11-08 DIAGNOSIS — B001 Herpesviral vesicular dermatitis: Secondary | ICD-10-CM | POA: Insufficient documentation

## 2014-11-08 DIAGNOSIS — F419 Anxiety disorder, unspecified: Secondary | ICD-10-CM | POA: Insufficient documentation

## 2014-11-08 DIAGNOSIS — J449 Chronic obstructive pulmonary disease, unspecified: Secondary | ICD-10-CM | POA: Insufficient documentation

## 2014-11-08 DIAGNOSIS — G8929 Other chronic pain: Secondary | ICD-10-CM

## 2014-11-08 DIAGNOSIS — M5137 Other intervertebral disc degeneration, lumbosacral region: Secondary | ICD-10-CM | POA: Insufficient documentation

## 2014-11-08 DIAGNOSIS — L918 Other hypertrophic disorders of the skin: Secondary | ICD-10-CM | POA: Insufficient documentation

## 2014-11-08 DIAGNOSIS — M791 Myalgia, unspecified site: Secondary | ICD-10-CM | POA: Insufficient documentation

## 2014-11-08 DIAGNOSIS — M25572 Pain in left ankle and joints of left foot: Secondary | ICD-10-CM | POA: Diagnosis not present

## 2014-11-08 DIAGNOSIS — M940 Chondrocostal junction syndrome [Tietze]: Secondary | ICD-10-CM | POA: Insufficient documentation

## 2014-11-08 DIAGNOSIS — M549 Dorsalgia, unspecified: Secondary | ICD-10-CM | POA: Insufficient documentation

## 2014-11-08 DIAGNOSIS — H9190 Unspecified hearing loss, unspecified ear: Secondary | ICD-10-CM | POA: Insufficient documentation

## 2014-11-08 DIAGNOSIS — F17210 Nicotine dependence, cigarettes, uncomplicated: Secondary | ICD-10-CM | POA: Insufficient documentation

## 2014-11-08 DIAGNOSIS — N951 Menopausal and female climacteric states: Secondary | ICD-10-CM | POA: Insufficient documentation

## 2014-11-08 DIAGNOSIS — K760 Fatty (change of) liver, not elsewhere classified: Secondary | ICD-10-CM | POA: Insufficient documentation

## 2014-11-08 DIAGNOSIS — R251 Tremor, unspecified: Secondary | ICD-10-CM | POA: Insufficient documentation

## 2014-11-08 DIAGNOSIS — M51379 Other intervertebral disc degeneration, lumbosacral region without mention of lumbar back pain or lower extremity pain: Secondary | ICD-10-CM | POA: Insufficient documentation

## 2014-11-08 DIAGNOSIS — Z8742 Personal history of other diseases of the female genital tract: Secondary | ICD-10-CM | POA: Insufficient documentation

## 2014-11-08 DIAGNOSIS — J329 Chronic sinusitis, unspecified: Secondary | ICD-10-CM | POA: Insufficient documentation

## 2014-11-08 DIAGNOSIS — Z8711 Personal history of peptic ulcer disease: Secondary | ICD-10-CM

## 2014-11-08 DIAGNOSIS — M25519 Pain in unspecified shoulder: Secondary | ICD-10-CM | POA: Insufficient documentation

## 2014-11-08 DIAGNOSIS — M5430 Sciatica, unspecified side: Secondary | ICD-10-CM | POA: Insufficient documentation

## 2014-11-08 DIAGNOSIS — E119 Type 2 diabetes mellitus without complications: Secondary | ICD-10-CM | POA: Insufficient documentation

## 2014-11-08 MED ORDER — RANITIDINE HCL 150 MG PO CAPS: 150.0000 mg | ORAL_CAPSULE | Freq: Two times a day (BID) | ORAL | Status: DC

## 2014-11-08 MED ORDER — HYDROCODONE-ACETAMINOPHEN 5-325 MG PO TABS: 1.0000 | ORAL_TABLET | Freq: Four times a day (QID) | ORAL | Status: DC | PRN

## 2014-11-08 NOTE — Progress Notes (Signed)
Patient: Misty Bishop [facility] Female    DOB: 08-23-1955   59 y.o.   MRN: [id number] Visit Date: 11/08/2014  Today's Provider: Lelon Huh, MD   Reason For visit:  Abdominal pain Ankle Pain   Subjective:    HPI Patient comes in today stating that she has had abdominal pain for several months. Patient describes the pain as a soreness all over. Patient has had a change in her bowels to where the stool is more loos and dark. Patient thinks the Dexilant may have cause the dark stools. Patient has been taking Pro -Biotic and Pepto Bismol.She states she has a remote history of bleeding ulcers when she was about 59 years old and treated with ranitidine, but has had other episodes since then. She has been alternating Dexilant and ranitidine.      Ankle pain: Patient states she has pain in both ankles for 3 weeks. Pain is worse in the left ankle. Patient states pain worsens when walking. No specific injury.   Allergies  Allergen Reactions  . Levaquin  [Levofloxacin In D5w]     lip swelling, SOB.  Marland Kitchen Penicillins     Pt tolerates Cefdinir  . Sulfa Antibiotics Other (See Comments) and Itching   Previous Medications   ALBUTEROL (VENTOLIN HFA) 108 (90 BASE) MCG/ACT INHALER    Inhale 2 puffs into the lungs every 6 (six) hours as needed.   AMLODIPINE (NORVASC) 10 MG TABLET    Take 0.5 tablets by mouth daily.   ARIPIPRAZOLE (ABILIFY) 15 MG TABLET    Take 15 mg by mouth at bedtime.     ASPIRIN 81 MG EC TABLET    Take 81 mg by mouth daily.     B COMPLEX VITAMINS CAPSULE    Take 1 capsule by mouth Once PRN.     BISMUTH SUBSALICYLATE (PEPTO-BISMOL PO)    Take by mouth daily as needed.   BLACK CURRANT SEED OIL 500 MG CAPS    Take 1 tablet by mouth 2 (two) times daily.   BUDESONIDE-FORMOTEROL (SYMBICORT) 160-4.5 MCG/ACT INHALER    Inhale 2 puffs into the lungs 2 (two) times daily.   CITALOPRAM (CELEXA) 20 MG TABLET    Take 20 mg by mouth daily.     CLONAZEPAM (KLONOPIN) 1 MG TABLET    Take  by mouth. Take 1-2 tablets daily and 2 tablets at bedtime.    DEXLANSOPRAZOLE (DEXILANT) 60 MG CAPSULE    Take 1 capsule (60 mg total) by mouth daily.   DEXTROMETHORPHAN-GUAIFENESIN (MUCUS RELIEF DM COUGH) 20-400 MG TABS    Take 1 tablet by mouth every 4 (four) hours as needed.   DICLOFENAC (VOLTAREN) 75 MG EC TABLET    Take 1 tablet by mouth 2 (two) times daily as needed.   DICLOFENAC SODIUM (VOLTAREN) 1 % GEL    Place 1 application onto the skin 4 (four) times daily as needed. Apply to affected area   DICLOFENAC-MISOPROSTOL (ARTHROTEC) 75-0.2 MG TBEC    Take 1 tablet by mouth 2 (two) times daily.   FLUTICASONE (FLONASE) 50 MCG/ACT NASAL SPRAY    Place 2 sprays into the nose as directed.     GLUCOSE BLOOD (ONE TOUCH ULTRA TEST) TEST STRIP    Use to check blood sugar once a day   HYDROCODONE-ACETAMINOPHEN (NORCO/VICODIN) 5-325 MG PER TABLET    Take 1-2 tablets by mouth every 6 (six) hours as needed for moderate pain. 1-2 Tablets every 4-6 hours as needed  LORATADINE (CLARITIN) 10 MG TABLET    Take 1 tablet by mouth daily.   LORATADINE-PSEUDOEPHEDRINE (CLARITIN-D 24-HOUR) 10-240 MG PER 24 HR TABLET    Take 1 tablet by mouth daily.     LOSARTAN-HYDROCHLOROTHIAZIDE (HYZAAR) 50-12.5 MG PER TABLET    Take 1 tablet by mouth daily.   MONTELUKAST (SINGULAIR) 10 MG TABLET    Take 1 tablet by mouth daily.   OMEGA 3-6-9 FATTY ACIDS (OMEGA 3-6-9 COMPLEX) CAPS    Take 1 capsule by mouth daily.   PROBIOTIC PRODUCT (PROBIOTIC PO)    Take 3 tablets by mouth daily.   RANITIDINE (ZANTAC) 150 MG CAPSULE    Take 150 mg by mouth 2 (two) times daily.     SIMVASTATIN (ZOCOR) 20 MG TABLET    Take 20 mg by mouth at bedtime.     VALACYCLOVIR (VALTREX) 1000 MG TABLET    Take 1 tablet by mouth. Every 8 hours for seven days as needed   VITAMIN C (ASCORBIC ACID) 250 MG TABLET    Take 4 tablets by mouth daily.    Review of Systems  Constitutional: Negative for fever, chills, appetite change and fatigue.  Respiratory:  Negative for chest tightness and shortness of breath.   Cardiovascular: Negative for chest pain and palpitations.  Gastrointestinal: Positive for abdominal pain and diarrhea (soft stool). Negative for nausea and vomiting.  Musculoskeletal: Positive for arthralgias.  Neurological: Negative for dizziness and weakness.    Social History  Substance Use Topics  . Smoking status: Current Every Day Smoker    Last Attempt to Quit: 03/18/2009  . Smokeless tobacco: Not on file  . Alcohol Use: 0.0 oz/week    0 Standard drinks or equivalent per week     Comment: occasional use   Objective:   BP 142/82 mmHg  Pulse 58  Temp(Src) 98.8 F (37.1 C) (Oral)  Resp 16  Ht 5\' 7"  (1.702 m)  Wt 251 lb (113.853 kg)  BMI 39.30 kg/m2  SpO2 97%  Physical Exam   General Appearance:    Alert, cooperative, no distress  Eyes:    PERRL, conjunctiva/corneas clear, EOM's intact       Lungs:     Clear to auscultation bilaterally, respirations unlabored  Heart:    Regular rate and rhythm  Neurologic:   Awake, alert, oriented x 3. No apparent focal neurological           defect.           Assessment & Plan:     1. Ankle pain, chronic, left  - DG Ankle Complete Left; Future  2. Cutaneous skin tags   - Ambulatory referral to Dermatology  3. H/O peptic ulcer Not currently taking diclofenac and advised not to take any rx or OTC NSAID.   - Bismuth Subsalicylate (PEPTO-BISMOL PO); Take by mouth daily as needed. - ranitidine (ZANTAC) 150 MG capsule; Take 1 capsule (150 mg total) by mouth 2 (two) times daily.  Dispense: 180 capsule; Refill: 3 Call if not resolved within a few days. Consider GI referral.       Lelon Huh, MD  [facility]

## 2014-11-10 ENCOUNTER — Other Ambulatory Visit: Payer: Self-pay | Admitting: Family Medicine

## 2014-11-15 ENCOUNTER — Encounter: Payer: Self-pay | Admitting: Family Medicine

## 2014-11-15 DIAGNOSIS — F319 Bipolar disorder, unspecified: Secondary | ICD-10-CM | POA: Insufficient documentation

## 2014-11-15 DIAGNOSIS — E785 Hyperlipidemia, unspecified: Secondary | ICD-10-CM | POA: Insufficient documentation

## 2014-11-30 ENCOUNTER — Ambulatory Visit
Admission: RE | Admit: 2014-11-30 | Discharge: 2014-11-30 | Disposition: A | Discharge status: Home / Self Care | Payer: Commercial Managed Care - HMO | Source: Ambulatory Visit | Attending: Family Medicine | Admitting: Family Medicine

## 2014-11-30 ENCOUNTER — Telehealth: Payer: Self-pay | Admitting: *Deleted

## 2014-11-30 ENCOUNTER — Telehealth: Payer: Self-pay | Admitting: Family Medicine

## 2014-11-30 DIAGNOSIS — M25572 Pain in left ankle and joints of left foot: Secondary | ICD-10-CM | POA: Insufficient documentation

## 2014-11-30 DIAGNOSIS — G8929 Other chronic pain: Secondary | ICD-10-CM

## 2014-11-30 DIAGNOSIS — M7989 Other specified soft tissue disorders: Secondary | ICD-10-CM | POA: Diagnosis not present

## 2014-11-30 NOTE — Telephone Encounter (Signed)
Please give clarifications on hydrocodone directions?

## 2014-11-30 NOTE — Telephone Encounter (Signed)
-----   Message from Birdie Sons, MD sent at 11/30/2014 12:33 PM EDT ----- Xray ankle shows old fracture on the inner aspect of ankle and a spur on the heel. If still having pain then recommend seeing an orthopedist.

## 2014-11-30 NOTE — Telephone Encounter (Signed)
[employer] called regarding directions on her Hydrocodone written on 8/23  Please call back for clarifications on dosage  Call back is [phone]  Thanks teri

## 2014-11-30 NOTE — Telephone Encounter (Signed)
Clarification given. 

## 2014-11-30 NOTE — Telephone Encounter (Signed)
Should read "Take 1-2 tablets by mouth every 6 (six) hours as needed for moderate pain."

## 2014-11-30 NOTE — Telephone Encounter (Signed)
Patient notified of results. Expressed understanding. Patient stated that she is still having pain. Sent referral.

## 2014-12-05 DIAGNOSIS — L0889 Other specified local infections of the skin and subcutaneous tissue: Secondary | ICD-10-CM | POA: Diagnosis not present

## 2014-12-05 DIAGNOSIS — L538 Other specified erythematous conditions: Secondary | ICD-10-CM | POA: Diagnosis not present

## 2014-12-05 DIAGNOSIS — L821 Other seborrheic keratosis: Secondary | ICD-10-CM | POA: Diagnosis not present

## 2014-12-05 DIAGNOSIS — L820 Inflamed seborrheic keratosis: Secondary | ICD-10-CM | POA: Diagnosis not present

## 2014-12-05 DIAGNOSIS — R208 Other disturbances of skin sensation: Secondary | ICD-10-CM | POA: Diagnosis not present

## 2014-12-05 DIAGNOSIS — Z789 Other specified health status: Secondary | ICD-10-CM | POA: Diagnosis not present

## 2014-12-05 DIAGNOSIS — L280 Lichen simplex chronicus: Secondary | ICD-10-CM | POA: Diagnosis not present

## 2014-12-05 DIAGNOSIS — L918 Other hypertrophic disorders of the skin: Secondary | ICD-10-CM | POA: Diagnosis not present

## 2014-12-13 ENCOUNTER — Ambulatory Visit: Payer: Commercial Managed Care - HMO | Admitting: Family Medicine

## 2014-12-15 ENCOUNTER — Ambulatory Visit: Payer: Commercial Managed Care - HMO | Admitting: Family Medicine

## 2014-12-20 DIAGNOSIS — M19072 Primary osteoarthritis, left ankle and foot: Secondary | ICD-10-CM | POA: Diagnosis not present

## 2015-02-20 ENCOUNTER — Other Ambulatory Visit: Payer: Self-pay | Admitting: Family Medicine

## 2015-04-18 ENCOUNTER — Encounter: Payer: Self-pay | Admitting: Family Medicine

## 2015-04-18 ENCOUNTER — Ambulatory Visit: Payer: Commercial Managed Care - HMO | Admitting: Family Medicine

## 2015-04-18 ENCOUNTER — Ambulatory Visit (INDEPENDENT_AMBULATORY_CARE_PROVIDER_SITE_OTHER): Payer: Commercial Managed Care - HMO | Admitting: Family Medicine

## 2015-04-18 VITALS — BP 132/72 | HR 74 | Temp 98.2°F | Resp 18 | Wt 256.0 lb

## 2015-04-18 DIAGNOSIS — E119 Type 2 diabetes mellitus without complications: Secondary | ICD-10-CM | POA: Diagnosis not present

## 2015-04-18 DIAGNOSIS — J40 Bronchitis, not specified as acute or chronic: Secondary | ICD-10-CM | POA: Diagnosis not present

## 2015-04-18 LAB — POCT GLYCOSYLATED HEMOGLOBIN (HGB A1C)
Est. average glucose Bld gHb Est-mCnc: 148
Hemoglobin A1C: 6.8

## 2015-04-18 MED ORDER — CEFDINIR 300 MG PO CAPS: 600.0000 mg | ORAL_CAPSULE | Freq: Every day | ORAL | Status: DC

## 2015-04-18 MED ORDER — METFORMIN HCL ER 500 MG PO TB24
500.0000 mg | ORAL_TABLET | Freq: Every day | ORAL | Status: DC
Start: 2015-04-18 — End: 2015-05-23

## 2015-04-18 NOTE — Progress Notes (Signed)
Patient: Misty Bishop [facility] Female    DOB: [birth year]   60 y.o.   MRN: [id number] Visit Date: 04/18/2015  Today's Provider: Lelon Huh, MD   Chief Complaint  Patient presents with  . URI   Subjective:    URI  This is a new problem. Episode onset: 6 days ago. Associated symptoms include congestion (chest), coughing (productive thick white), ear pain, headaches, a plugged ear sensation, rhinorrhea, sinus pain, sneezing, a sore throat and wheezing. Pertinent negatives include no abdominal pain, chest pain, diarrhea, nausea or vomiting. She has tried NSAIDs and inhaler use (Coricidin BP) for the symptoms. The treatment provided mild relief.  Patient thinks she may have had a fever, but never checked her temperature at home.   Follow up Diabetes. She has not been following diet very well. [facility] but states she was not able to get transportation  And never finished classes.    Allergies  Allergen Reactions  . Levaquin  [Levofloxacin In D5w]     lip swelling, SOB.  Marland Kitchen Penicillins     Pt tolerates Cefdinir  . Sulfa Antibiotics Other (See Comments) and Itching   Previous Medications   AMLODIPINE (NORVASC) 10 MG TABLET    Take 0.5 tablets by mouth daily.   ARIPIPRAZOLE (ABILIFY) 15 MG TABLET    Take 15 mg by mouth at bedtime.     ASPIRIN 81 MG EC TABLET    Take 81 mg by mouth daily.     B COMPLEX VITAMINS CAPSULE    Take 1 capsule by mouth Once PRN.     BISMUTH SUBSALICYLATE (PEPTO-BISMOL PO)    Take by mouth daily as needed.   BLACK CURRANT SEED OIL 500 MG CAPS    Take 1 tablet by mouth 2 (two) times daily.   BUDESONIDE-FORMOTEROL (SYMBICORT) 160-4.5 MCG/ACT INHALER    Inhale 2 puffs into the lungs 2 (two) times daily.   CITALOPRAM (CELEXA) 20 MG TABLET    Take 20 mg by mouth daily.     CLONAZEPAM (KLONOPIN) 1 MG TABLET    Take by mouth. Take 1-2 tablets daily and 2 tablets at bedtime.    DEXLANSOPRAZOLE (DEXILANT) 60 MG CAPSULE    Take 1  capsule (60 mg total) by mouth daily.   DEXTROMETHORPHAN-GUAIFENESIN (MUCUS RELIEF DM COUGH) 20-400 MG TABS    Take 1 tablet by mouth every 4 (four) hours as needed.   DICLOFENAC (VOLTAREN) 75 MG EC TABLET    Take 1 tablet by mouth 2 (two) times daily as needed.   DICLOFENAC SODIUM (VOLTAREN) 1 % GEL    APPLY TO AFFECTED AREA FOUR TIMES A DAY AS NEEDED   DICLOFENAC-MISOPROSTOL (ARTHROTEC) 75-0.2 MG TBEC    Take 1 tablet by mouth 2 (two) times daily.   FLUTICASONE (FLONASE) 50 MCG/ACT NASAL SPRAY    USE 2 SPRAYS IN EACH NOSTRIL EVERY DAY   GLUCOSE BLOOD (ONE TOUCH ULTRA TEST) TEST STRIP    Use to check blood sugar once a day   HYDROCODONE-ACETAMINOPHEN (NORCO/VICODIN) 5-325 MG PER TABLET    Take 1-2 tablets by mouth every 6 (six) hours as needed for moderate pain. 1-2 Tablets every 4-6 hours as needed   LORATADINE (CLARITIN) 10 MG TABLET    Take 1 tablet by mouth daily.   LORATADINE-PSEUDOEPHEDRINE (CLARITIN-D 24-HOUR) 10-240 MG PER 24 HR TABLET    Take 1 tablet by mouth daily.     LOSARTAN-HYDROCHLOROTHIAZIDE (HYZAAR) 50-12.5 MG PER TABLET  TAKE 1 TABLET EVERY DAY   MONTELUKAST (SINGULAIR) 10 MG TABLET    TAKE 1 TABLET  DAILY   OMEGA 3-6-9 FATTY ACIDS (OMEGA 3-6-9 COMPLEX) CAPS    Take 1 capsule by mouth daily.   PROBIOTIC PRODUCT (PROBIOTIC PO)    Take 3 tablets by mouth daily.   RANITIDINE (ZANTAC) 150 MG CAPSULE    Take 1 capsule (150 mg total) by mouth 2 (two) times daily.   SIMVASTATIN (ZOCOR) 20 MG TABLET    Take 20 mg by mouth at bedtime.     VALACYCLOVIR (VALTREX) 1000 MG TABLET    Take 1 tablet by mouth. Every 8 hours for seven days as needed   VENTOLIN HFA 108 (90 BASE) MCG/ACT INHALER    INHALE 2 PUFFS EVERY 6 HOURS AS NEEDED   VITAMIN C (ASCORBIC ACID) 250 MG TABLET    Take 4 tablets by mouth daily.    Review of Systems  Constitutional: Positive for fever, chills and fatigue. Negative for appetite change.  HENT: Positive for congestion (chest), ear pain, rhinorrhea, sneezing and  sore throat.   Respiratory: Positive for cough (productive thick white), shortness of breath and wheezing. Negative for chest tightness.   Cardiovascular: Negative for chest pain and palpitations.  Gastrointestinal: Negative for nausea, vomiting, abdominal pain and diarrhea.  Neurological: Positive for weakness and headaches. Negative for dizziness.    Social History  Substance Use Topics  . Smoking status: Current Every Day Smoker -- 1.00 packs/day    Types: Cigarettes    Last Attempt to Quit: 03/18/2009  . Smokeless tobacco: Not on file  . Alcohol Use: 0.0 oz/week    0 Standard drinks or equivalent per week     Comment: occasional use; 4 times a  year   Objective:   BP 132/72 mmHg  Pulse 74  Temp(Src) 98.2 F (36.8 C) (Oral)  Resp 18  Wt 256 lb (116.121 kg)  SpO2 98%  Physical Exam  General Appearance:    Alert, cooperative, no distress  HENT:   bilateral TM normal without fluid or infection, neck without nodes and sinuses nontender  Eyes:    PERRL, conjunctiva/corneas clear, EOM's intact       Lungs:     Occasional expiratory wheeze respirations unlabored  Heart:    Regular rate and rhythm  Neurologic:   Awake, alert, oriented x 3. No apparent focal neurological           defect.       Results for orders placed or performed in visit on 04/18/15  POCT glycosylated hemoglobin (Hb A1C)  Result Value Ref Range   Hemoglobin A1C 6.8    Est. average glucose Bld gHb Est-mCnc 148         Assessment & Plan:     1. Bronchitis  - cefdinir (OMNICEF) 300 MG capsule; Take 2 capsules (600 mg total) by mouth daily.  Dispense: 20 capsule; Refill: 0  2. Type 2 diabetes mellitus without complication, without long-term current use of insulin (HCC) Start metformin - POCT glycosylated hemoglobin (Hb A1C) - metFORMIN (GLUCOPHAGE-XR) 500 MG 24 hr tablet; Take 1 tablet (500 mg total) by mouth daily with breakfast.  Dispense: 30 tablet; Refill: 3  Return in 2 months to check A1c.         Lelon Huh, MD  [facility]

## 2015-05-03 ENCOUNTER — Other Ambulatory Visit: Payer: Self-pay | Admitting: Family Medicine

## 2015-05-03 DIAGNOSIS — J40 Bronchitis, not specified as acute or chronic: Secondary | ICD-10-CM

## 2015-05-03 MED ORDER — CEFDINIR 300 MG PO CAPS: 600.0000 mg | ORAL_CAPSULE | Freq: Every day | ORAL | Status: AC

## 2015-05-03 NOTE — Telephone Encounter (Signed)
Pt contacted office for refill request on the following medications: cefdinir (OMNICEF) 300 MG capsule to [facility] [place]. Pt stated that she was feeling better when she started taking the medication and then started taking antacids. Pt stated that she read that antacids can make the antibotic less effective and stopped taking the antacids. Pt stated that she would like another round of the medication. Please advise. Thanks TNP

## 2015-05-23 ENCOUNTER — Other Ambulatory Visit: Payer: Self-pay | Admitting: *Deleted

## 2015-05-23 DIAGNOSIS — E119 Type 2 diabetes mellitus without complications: Secondary | ICD-10-CM

## 2015-05-23 MED ORDER — METFORMIN HCL ER 500 MG PO TB24: 500.0000 mg | ORAL_TABLET | Freq: Every day | ORAL | Status: DC

## 2015-05-23 NOTE — Telephone Encounter (Signed)
Requesting 90 day supply.

## 2015-05-30 ENCOUNTER — Encounter: Payer: Commercial Managed Care - HMO | Admitting: Family Medicine

## 2015-06-06 ENCOUNTER — Other Ambulatory Visit: Payer: Self-pay | Admitting: Family Medicine

## 2015-06-07 ENCOUNTER — Encounter: Payer: Commercial Managed Care - HMO | Admitting: Family Medicine

## 2015-06-07 ENCOUNTER — Ambulatory Visit: Payer: Commercial Managed Care - HMO | Admitting: Family Medicine

## 2015-06-22 ENCOUNTER — Telehealth: Payer: Self-pay

## 2015-06-22 DIAGNOSIS — J302 Other seasonal allergic rhinitis: Secondary | ICD-10-CM | POA: Diagnosis not present

## 2015-06-22 DIAGNOSIS — Z8249 Family history of ischemic heart disease and other diseases of the circulatory system: Secondary | ICD-10-CM | POA: Diagnosis not present

## 2015-06-22 DIAGNOSIS — I10 Essential (primary) hypertension: Secondary | ICD-10-CM | POA: Diagnosis not present

## 2015-06-22 DIAGNOSIS — R0602 Shortness of breath: Secondary | ICD-10-CM | POA: Diagnosis not present

## 2015-06-22 DIAGNOSIS — F419 Anxiety disorder, unspecified: Secondary | ICD-10-CM | POA: Diagnosis not present

## 2015-06-22 DIAGNOSIS — J449 Chronic obstructive pulmonary disease, unspecified: Secondary | ICD-10-CM | POA: Diagnosis not present

## 2015-06-22 DIAGNOSIS — E119 Type 2 diabetes mellitus without complications: Secondary | ICD-10-CM | POA: Diagnosis not present

## 2015-06-22 DIAGNOSIS — R51 Headache: Secondary | ICD-10-CM | POA: Diagnosis not present

## 2015-06-22 DIAGNOSIS — R42 Dizziness and giddiness: Secondary | ICD-10-CM | POA: Diagnosis not present

## 2015-06-22 DIAGNOSIS — H538 Other visual disturbances: Secondary | ICD-10-CM | POA: Diagnosis not present

## 2015-06-22 DIAGNOSIS — R079 Chest pain, unspecified: Secondary | ICD-10-CM | POA: Diagnosis not present

## 2015-06-22 NOTE — Telephone Encounter (Signed)
Patient called stating her blood pressure has been running high and she cant get it down. For the past week she has been feeling dizzy and woozy along with blurred vision. Patient took her blood pressure 5 minutes ago and it was 161/101. Right now it is 173/95. Patient is on Losartan-HCTZ 50-12.5mg  daily. She says she has Amlodipine 10mg  that she was instructed to take if her blood pressure becomes elevated. Patient took 1/2 tablet of Amlodipine 30 minutes ago and her blood pressure came down to 151/102. Her blood pressure is now back up and patient wants to know if she should go to the ER. I consulted with Dr. Caryn Section and he advised to to inform the patient that she should take the other 1/2 tablet of Amlodipine to see if her blood pressure will come down. Dr. Caryn Section also recommend that patient go to ER due to symptoms of blurred vision and dizziness. Patient advised and verbally voiced understanding. Patient agrees to take the other1/2 pill of Amlodipine and go to the ER. Patient plans to go to the hospital in [facility] because it is closest to her. Patient denies any chest pain or numbness.

## 2015-06-26 ENCOUNTER — Telehealth: Payer: Self-pay | Admitting: Family Medicine

## 2015-06-26 NOTE — Telephone Encounter (Signed)
[employer] stated that the meter pt needs is [employer] with supplies. Please advise?

## 2015-06-26 NOTE — Telephone Encounter (Signed)
Pt stated that [employer] was supposed to send request for a new glucose meter and testing supplies b/c her insurance doesn't cover what she is currently using. Pt stated that she thinks they were requesting something that started with [employer]. I advised pt to contact [employer] to verify which brand her insurance would cover just in case we haven't received the fax. Pt stated that she has few supplies left. Please advise. Thanks TNP

## 2015-06-27 NOTE — Telephone Encounter (Signed)
Pt called back to get an update. I advised pt that Sharyn Lull contacted the insurance and found out what supplies they will cover. Pt stated she is almost out of supplies and would like this sent today if possible. Please see below. Please advise. Thanks TNP

## 2015-07-05 ENCOUNTER — Encounter: Payer: Commercial Managed Care - HMO | Admitting: Family Medicine

## 2015-07-06 MED ORDER — GLUCOSE BLOOD VI STRP: ORAL_STRIP | Status: DC

## 2015-07-06 MED ORDER — ACCU-CHEK AVIVA PLUS W/DEVICE KIT: PACK | Status: AC

## 2015-08-16 ENCOUNTER — Encounter: Payer: Self-pay | Admitting: Family Medicine

## 2015-08-16 ENCOUNTER — Ambulatory Visit (INDEPENDENT_AMBULATORY_CARE_PROVIDER_SITE_OTHER): Payer: Commercial Managed Care - HMO | Admitting: Family Medicine

## 2015-08-16 VITALS — BP 132/82 | HR 60 | Temp 98.5°F | Resp 18 | Wt 253.0 lb

## 2015-08-16 DIAGNOSIS — J309 Allergic rhinitis, unspecified: Secondary | ICD-10-CM

## 2015-08-16 DIAGNOSIS — M5137 Other intervertebral disc degeneration, lumbosacral region: Secondary | ICD-10-CM

## 2015-08-16 DIAGNOSIS — J42 Unspecified chronic bronchitis: Secondary | ICD-10-CM | POA: Diagnosis not present

## 2015-08-16 DIAGNOSIS — J019 Acute sinusitis, unspecified: Secondary | ICD-10-CM

## 2015-08-16 MED ORDER — CEFDINIR 300 MG PO CAPS: 600.0000 mg | ORAL_CAPSULE | Freq: Every day | ORAL | Status: DC

## 2015-08-16 MED ORDER — PREDNISONE 10 MG PO TABS: ORAL_TABLET | ORAL | Status: AC

## 2015-08-16 MED ORDER — HYDROCODONE-ACETAMINOPHEN 5-325 MG PO TABS: 1.0000 | ORAL_TABLET | Freq: Four times a day (QID) | ORAL | Status: DC | PRN

## 2015-08-16 MED ORDER — BUDESONIDE-FORMOTEROL FUMARATE 160-4.5 MCG/ACT IN AERO
2.0000 | INHALATION_SPRAY | Freq: Two times a day (BID) | RESPIRATORY_TRACT | Status: DC
Start: 2015-08-16 — End: 2016-05-15

## 2015-08-16 NOTE — Progress Notes (Signed)
Patient: Misty Bishop [facility] Female    DOB: 1955-06-03   60 y.o.   MRN: [id number] Visit Date: 08/16/2015  Today's Provider: Lelon Huh, MD   Chief Complaint  Patient presents with  . Sinus Problem   Subjective:    Sinus Problem This is a new problem. Episode onset: 1 week ago. The problem has been gradually worsening since onset. Associated symptoms include congestion (sinus), coughing, ear pain (right ear), headaches, a hoarse voice, shortness of breath and sinus pressure. Pertinent negatives include no chills. Treatments tried: Advil. The treatment provided mild relief.   Has sensation of water in nose, some nasal congestion. Pressure in both ears. Usually uses Flonase every day, but stopped a couple days ago.     Allergies  Allergen Reactions  . Levaquin  [Levofloxacin In D5w]     lip swelling, SOB.  Marland Kitchen Penicillins     Pt tolerates Cefdinir  . Sulfa Antibiotics Other (See Comments) and Itching   Previous Medications   AMLODIPINE (NORVASC) 10 MG TABLET    Take 0.5 tablets by mouth daily.   ARIPIPRAZOLE (ABILIFY) 15 MG TABLET    Take 15 mg by mouth at bedtime.     ASPIRIN 81 MG EC TABLET    Take 81 mg by mouth daily.     B COMPLEX VITAMINS CAPSULE    Take 1 capsule by mouth Once PRN.     BISMUTH SUBSALICYLATE (PEPTO-BISMOL PO)    Take by mouth daily as needed.   BLACK CURRANT SEED OIL 500 MG CAPS    Take 1 tablet by mouth 2 (two) times daily.   BUDESONIDE-FORMOTEROL (SYMBICORT) 160-4.5 MCG/ACT INHALER    Inhale 2 puffs into the lungs 2 (two) times daily.   CITALOPRAM (CELEXA) 20 MG TABLET    Take 20 mg by mouth daily.     CLONAZEPAM (KLONOPIN) 1 MG TABLET    Take by mouth. Take 1-2 tablets daily and 2 tablets at bedtime.    DEXLANSOPRAZOLE (DEXILANT) 60 MG CAPSULE    Take 1 capsule (60 mg total) by mouth daily.   DEXTROMETHORPHAN-GUAIFENESIN (MUCUS RELIEF DM COUGH) 20-400 MG TABS    Take 1 tablet by mouth every 4 (four) hours as needed.   DICLOFENAC (VOLTAREN) 75  MG EC TABLET    Take 1 tablet by mouth 2 (two) times daily as needed.   DICLOFENAC SODIUM (VOLTAREN) 1 % GEL    APPLY TO AFFECTED AREA FOUR TIMES A DAY AS NEEDED   DICLOFENAC-MISOPROSTOL (ARTHROTEC) 75-0.2 MG TBEC    Take 1 tablet by mouth 2 (two) times daily.   FLUTICASONE (FLONASE) 50 MCG/ACT NASAL SPRAY    USE 2 SPRAYS IN EACH NOSTRIL EVERY DAY   GLUCOSE BLOOD (ACCU-CHEK AVIVA PLUS) TEST STRIP    Use to check blood sugar once a day. E11.9   HYDROCODONE-ACETAMINOPHEN (NORCO/VICODIN) 5-325 MG PER TABLET    Take 1-2 tablets by mouth every 6 (six) hours as needed for moderate pain. 1-2 Tablets every 4-6 hours as needed   LORATADINE (CLARITIN) 10 MG TABLET    Take 1 tablet by mouth daily.   LOSARTAN-HYDROCHLOROTHIAZIDE (HYZAAR) 50-12.5 MG PER TABLET    TAKE 1 TABLET EVERY DAY   METFORMIN (GLUCOPHAGE-XR) 500 MG 24 HR TABLET    Take 1 tablet (500 mg total) by mouth daily with breakfast.   MONTELUKAST (SINGULAIR) 10 MG TABLET    TAKE 1 TABLET EVERY DAY   OMEGA 3-6-9 FATTY ACIDS (OMEGA 3-6-9 COMPLEX) CAPS  Take 1 capsule by mouth daily.   PROBIOTIC PRODUCT (PROBIOTIC PO)    Take 3 tablets by mouth daily.   RANITIDINE (ZANTAC) 150 MG CAPSULE    Take 1 capsule (150 mg total) by mouth 2 (two) times daily.   SIMVASTATIN (ZOCOR) 20 MG TABLET    Take 20 mg by mouth at bedtime.     VALACYCLOVIR (VALTREX) 1000 MG TABLET    Take 1 tablet by mouth. Every 8 hours for seven days as needed   VENTOLIN HFA 108 (90 BASE) MCG/ACT INHALER    INHALE 2 PUFFS EVERY 6 HOURS AS NEEDED   VITAMIN C (ASCORBIC ACID) 250 MG TABLET    Take 4 tablets by mouth daily.    Review of Systems  Constitutional: Positive for fever. Negative for chills, appetite change and fatigue.  HENT: Positive for congestion (sinus), ear discharge, ear pain (right ear), hoarse voice and sinus pressure.        Pressure in ears  Respiratory: Positive for cough and shortness of breath. Negative for chest tightness.   Cardiovascular: Negative for chest  pain and palpitations.  Gastrointestinal: Negative for nausea, vomiting and abdominal pain.  Neurological: Positive for headaches. Negative for dizziness and weakness.    Social History  Substance Use Topics  . Smoking status: Current Every Day Smoker -- 1.00 packs/day    Types: Cigarettes    Last Attempt to Quit: 03/18/2009  . Smokeless tobacco: Not on file  . Alcohol Use: 0.0 oz/week    0 Standard drinks or equivalent per week     Comment: occasional use; 4 times a  year   Objective:   BP 132/82 mmHg  Pulse 60  Temp(Src) 98.5 F (36.9 C) (Oral)  Resp 18  Wt 253 lb (114.76 kg)  SpO2 97%  Physical Exam  General Appearance:    Alert, cooperative, no distress  HENT:   bilateral TM normal without fluid or infection, neck without nodes, pharynx erythematous without exudate, frontal sinus tender and nasal mucosa pale and congested  Eyes:    PERRL, conjunctiva/corneas clear, EOM's intact       Lungs:     Clear to auscultation bilaterally, respirations unlabored  Heart:    Regular rate and rhythm  Neurologic:   Awake, alert, oriented x 3. No apparent focal neurological           defect.           Assessment & Plan:     1. Acute sinusitis, recurrence not specified, unspecified location  - cefdinir (OMNICEF) 300 MG capsule; Take 2 capsules (600 mg total) by mouth daily.  Dispense: 20 capsule; Refill: 0 - predniSONE (DELTASONE) 10 MG tablet; 6 tablets for 1 day, then 5 for 1 day, then 4 for 1 day, then 3 for 1 day, then 2 for 1 day then 1 for 1 day.  Dispense: 21 tablet; Refill: 0  2. Allergic rhinitis, unspecified allergic rhinitis type Start back on prednisone  3. Chronic bronchitis, unspecified chronic bronchitis type (HCC) Refill Symbicort  4. Degeneration of lumbar or lumbosacral intervertebral disc Refill hydrocodone/apap        Lelon Huh, MD  [facility]

## 2015-08-23 DIAGNOSIS — J019 Acute sinusitis, unspecified: Secondary | ICD-10-CM | POA: Diagnosis not present

## 2015-08-23 DIAGNOSIS — R042 Hemoptysis: Secondary | ICD-10-CM | POA: Diagnosis not present

## 2015-08-23 DIAGNOSIS — J449 Chronic obstructive pulmonary disease, unspecified: Secondary | ICD-10-CM | POA: Diagnosis not present

## 2015-08-23 DIAGNOSIS — Z7951 Long term (current) use of inhaled steroids: Secondary | ICD-10-CM | POA: Diagnosis not present

## 2015-08-23 DIAGNOSIS — R0989 Other specified symptoms and signs involving the circulatory and respiratory systems: Secondary | ICD-10-CM | POA: Diagnosis not present

## 2015-08-23 DIAGNOSIS — F419 Anxiety disorder, unspecified: Secondary | ICD-10-CM | POA: Diagnosis not present

## 2015-08-23 DIAGNOSIS — Z7982 Long term (current) use of aspirin: Secondary | ICD-10-CM | POA: Diagnosis not present

## 2015-08-23 DIAGNOSIS — Z833 Family history of diabetes mellitus: Secondary | ICD-10-CM | POA: Diagnosis not present

## 2015-08-23 DIAGNOSIS — J9811 Atelectasis: Secondary | ICD-10-CM | POA: Diagnosis not present

## 2015-08-23 DIAGNOSIS — E119 Type 2 diabetes mellitus without complications: Secondary | ICD-10-CM | POA: Diagnosis not present

## 2015-08-23 DIAGNOSIS — J40 Bronchitis, not specified as acute or chronic: Secondary | ICD-10-CM | POA: Diagnosis not present

## 2015-08-23 DIAGNOSIS — R05 Cough: Secondary | ICD-10-CM | POA: Diagnosis not present

## 2015-08-23 DIAGNOSIS — R51 Headache: Secondary | ICD-10-CM | POA: Diagnosis not present

## 2015-08-23 DIAGNOSIS — R509 Fever, unspecified: Secondary | ICD-10-CM | POA: Diagnosis not present

## 2015-09-05 ENCOUNTER — Telehealth: Payer: Self-pay | Admitting: Family Medicine

## 2015-09-05 ENCOUNTER — Other Ambulatory Visit: Payer: Self-pay | Admitting: Family Medicine

## 2015-09-05 DIAGNOSIS — J019 Acute sinusitis, unspecified: Secondary | ICD-10-CM

## 2015-09-05 MED ORDER — CEFDINIR 300 MG PO CAPS: 600.0000 mg | ORAL_CAPSULE | Freq: Every day | ORAL | Status: AC

## 2015-09-05 NOTE — Progress Notes (Unsigned)
rf for cefdinir has been sent to pharmacy

## 2015-09-05 NOTE — Progress Notes (Signed)
Patient was notified.

## 2015-09-05 NOTE — Telephone Encounter (Signed)
Pt wants to know if you will refill her antibiotic that you gave her last time she was here around 2 weeks ago.  She is still having symptoms and coughing,  Shes not sure what the name of her antibiotic is.  A   She uses [facility] [place]  Her call back [phone]  Thanks, Con Memos

## 2015-09-12 ENCOUNTER — Emergency Department
Admission: EM | Admit: 2015-09-12 | Discharge: 2015-09-12 | Disposition: A | Discharge status: Home / Self Care | Payer: Commercial Managed Care - HMO | Attending: Emergency Medicine | Admitting: Emergency Medicine

## 2015-09-12 ENCOUNTER — Encounter: Payer: Self-pay | Admitting: Emergency Medicine

## 2015-09-12 ENCOUNTER — Emergency Department: Payer: Commercial Managed Care - HMO

## 2015-09-12 DIAGNOSIS — M5137 Other intervertebral disc degeneration, lumbosacral region: Secondary | ICD-10-CM | POA: Diagnosis not present

## 2015-09-12 DIAGNOSIS — Z7984 Long term (current) use of oral hypoglycemic drugs: Secondary | ICD-10-CM | POA: Diagnosis not present

## 2015-09-12 DIAGNOSIS — I10 Essential (primary) hypertension: Secondary | ICD-10-CM | POA: Insufficient documentation

## 2015-09-12 DIAGNOSIS — F17210 Nicotine dependence, cigarettes, uncomplicated: Secondary | ICD-10-CM | POA: Insufficient documentation

## 2015-09-12 DIAGNOSIS — E785 Hyperlipidemia, unspecified: Secondary | ICD-10-CM | POA: Insufficient documentation

## 2015-09-12 DIAGNOSIS — Z79899 Other long term (current) drug therapy: Secondary | ICD-10-CM | POA: Diagnosis not present

## 2015-09-12 DIAGNOSIS — R079 Chest pain, unspecified: Secondary | ICD-10-CM

## 2015-09-12 DIAGNOSIS — M5136 Other intervertebral disc degeneration, lumbar region: Secondary | ICD-10-CM | POA: Insufficient documentation

## 2015-09-12 DIAGNOSIS — J209 Acute bronchitis, unspecified: Secondary | ICD-10-CM | POA: Diagnosis not present

## 2015-09-12 DIAGNOSIS — M1990 Unspecified osteoarthritis, unspecified site: Secondary | ICD-10-CM | POA: Insufficient documentation

## 2015-09-12 DIAGNOSIS — Z7982 Long term (current) use of aspirin: Secondary | ICD-10-CM | POA: Insufficient documentation

## 2015-09-12 DIAGNOSIS — R0789 Other chest pain: Secondary | ICD-10-CM | POA: Diagnosis present

## 2015-09-12 DIAGNOSIS — Z7951 Long term (current) use of inhaled steroids: Secondary | ICD-10-CM | POA: Insufficient documentation

## 2015-09-12 DIAGNOSIS — Z792 Long term (current) use of antibiotics: Secondary | ICD-10-CM | POA: Insufficient documentation

## 2015-09-12 DIAGNOSIS — F319 Bipolar disorder, unspecified: Secondary | ICD-10-CM | POA: Diagnosis not present

## 2015-09-12 DIAGNOSIS — J40 Bronchitis, not specified as acute or chronic: Secondary | ICD-10-CM | POA: Diagnosis not present

## 2015-09-12 DIAGNOSIS — E119 Type 2 diabetes mellitus without complications: Secondary | ICD-10-CM | POA: Diagnosis not present

## 2015-09-12 DIAGNOSIS — J449 Chronic obstructive pulmonary disease, unspecified: Secondary | ICD-10-CM | POA: Insufficient documentation

## 2015-09-12 DIAGNOSIS — R072 Precordial pain: Secondary | ICD-10-CM | POA: Diagnosis not present

## 2015-09-12 DIAGNOSIS — R05 Cough: Secondary | ICD-10-CM | POA: Diagnosis not present

## 2015-09-12 LAB — CBC
HCT: 42.4 % (ref 35.0–47.0)
HEMOGLOBIN: 15.4 g/dL (ref 12.0–16.0)
MCH: 32.8 pg (ref 26.0–34.0)
MCHC: 36.4 g/dL — AB (ref 32.0–36.0)
MCV: 90.2 fL (ref 80.0–100.0)
PLATELETS: 185 10*3/uL (ref 150–440)
RBC: 4.7 MIL/uL (ref 3.80–5.20)
RDW: 12.4 % (ref 11.5–14.5)
WBC: 8.9 10*3/uL (ref 3.6–11.0)

## 2015-09-12 LAB — BASIC METABOLIC PANEL
Anion gap: 10 (ref 5–15)
BUN: 16 mg/dL (ref 6–20)
CHLORIDE: 99 mmol/L — AB (ref 101–111)
CO2: 28 mmol/L (ref 22–32)
CREATININE: 0.89 mg/dL (ref 0.44–1.00)
Calcium: 9.5 mg/dL (ref 8.9–10.3)
GFR calc non Af Amer: >60 mL/min (ref >60)
GLUCOSE: 163 mg/dL — AB (ref 65–99)
Potassium: 3.7 mmol/L (ref 3.5–5.1)
Sodium: 137 mmol/L (ref 135–145)

## 2015-09-12 LAB — TROPONIN I: Troponin I: <0.03 ng/mL (ref <0.03)

## 2015-09-12 MED ORDER — PREDNISONE 20 MG PO TABS: 40.0000 mg | ORAL_TABLET | Freq: Every day | ORAL | Status: DC

## 2015-09-12 NOTE — Discharge Instructions (Signed)
Nonspecific Chest Pain It is often hard to find the cause of chest pain. There is always a chance that your pain could be related to something serious, such as a heart attack or a blood clot in your lungs. Chest pain can also be caused by conditions that are not life-threatening. If you have chest pain, it is very important to follow up with your doctor.  HOME CARE  If you were prescribed an antibiotic medicine, finish it all even if you start to feel better.  Avoid any activities that cause chest pain.  Do not use any tobacco products, including cigarettes, chewing tobacco, or electronic cigarettes. If you need help quitting, ask your doctor.  Do not drink alcohol.  Take medicines only as told by your doctor.  Keep all follow-up visits as told by your doctor. This is important. This includes any further testing if your chest pain does not go away.  Your doctor may tell you to keep your head raised (elevated) while you sleep.  Make lifestyle changes as told by your doctor. These may include:  Getting regular exercise. Ask your doctor to suggest some activities that are safe for you.  Eating a heart-healthy diet. Your doctor or a diet specialist (dietitian) can help you to learn healthy eating options.  Maintaining a healthy weight.  Managing diabetes, if necessary.  Reducing stress. GET HELP IF:  Your chest pain does not go away, even after treatment.  You have a rash with blisters on your chest.  You have a fever. GET HELP RIGHT AWAY IF:  Your chest pain is worse.  You have an increasing cough, or you cough up blood.  You have severe belly (abdominal) pain.  You feel extremely weak.  You pass out (faint).  You have chills.  You have sudden, unexplained chest discomfort.  You have sudden, unexplained discomfort in your arms, back, neck, or jaw.  You have shortness of breath at any time.  You suddenly start to sweat, or your skin gets clammy.  You feel  nauseous.  You vomit.  You suddenly feel light-headed or dizzy.  Your heart begins to beat quickly, or it feels like it is skipping beats. These symptoms may be an emergency. Do not wait to see if the symptoms will go away. Get medical help right away. Call your local emergency services (911 in the [place] Do not drive yourself to the hospital.   This information is not intended to replace advice given to you by your health care provider. Make sure you discuss any questions you have with your health care provider.   Document Released: 08/21/2007 Document Revised: 03/25/2014 Document Reviewed: 10/08/2013 Elsevier Interactive Patient Education 2016 [employer].   Please use your narcotic pain medication as a cough suppressant. Please drink plenty of fluids and we will start another course of high-dose brief course of prednisone. Keep close observation of your blood sugars at home. He can also take over-the-counter Tylenol, Motrin, etc. For fever and/or body aches. Over-the-counter antihistamine may help such as Claritin, 0 tach. Please avoid Sudafed or other decongestions as these may raise her blood pressure

## 2015-09-12 NOTE — ED Notes (Signed)
Had sinus infection couple of weeks ago  Cough with some discomfort in chest  And feels like she can't catch her breath

## 2015-09-12 NOTE — ED Provider Notes (Signed)
Time Seen: Approximately *[id number]  I have reviewed the triage notes  Chief Complaint: Chest Pain   History of Present Illness: Misty Bishop is a 60 y.o. female *who presents with a three-day history of intermittent chest discomfort. She states pains mainly substernal and was previously located in the lower rib cage on both sides. She states at its been there intermittently and describes it as a sharp transient pain. She denies any associated symptoms with it and states that she's had a persistent cough. She's been under treatment for sinusitis and what sounds like acute bronchitis. She currently has a prescription for an antibiotic. She was concerned more about the persistent chest discomfort. Mild shortness of breath. No current hemoptysis but states that her cough is been occasionally productive with some yellow tinged sputum. She does continue to smoke and has a history of tobacco abuse and COPD. She's been on steroids recently in the past and states that she seemed to improve with the steroid and then as soon as the taper was over her symptoms return and seemed to migrate more from the sinus area down into the chest. She is not aware of any high fever at home.   Past Medical History  Diagnosis Date  . Depression   . Headache(784.0)   . Palpitations     Holter monitor in 2008 showed PVCs  . PTSD (post-traumatic stress disorder)   . Bipolar disorder ([place])   . Peptic ulcer disease     Patient Active Problem List   Diagnosis Date Noted  . Hyperlipemia   . Bipolar disorder ([place])   . Anxiety 11/08/2014  . Ache in joint 11/08/2014  . Back ache 11/08/2014  . COPD (chronic obstructive pulmonary disease) ([place]) 11/08/2014  . Degeneration of lumbar or lumbosacral intervertebral disc 11/08/2014  . Diabetes mellitus, type 2 ([place]) 11/08/2014  . Difficulty hearing 11/08/2014  . Cold sore 11/08/2014  . Personal history of reproductive and obstetrical problems 11/08/2014  .  Symptomatic menopausal or female climacteric states 11/08/2014  . Sciatica 11/08/2014  . Chronic sinusitis 11/08/2014  . Non-alcoholic fatty liver disease 11/08/2014  . Compulsive tobacco user syndrome 11/08/2014  . Tremor 11/08/2014  . Achrochordon 11/08/2014  . Cutaneous skin tags 11/08/2014  . GERD (gastroesophageal reflux disease) 08/19/2014  . ANA positive 12/29/2013  . Arthritis, degenerative 12/29/2013  . CHEST PAIN-UNSPECIFIED 07/13/2009  . HYPERTENSION, BENIGN 07/11/2009  . SHORTNESS OF BREATH 07/11/2009  . LBP (low back pain) 03/01/2007  . Allergic rhinitis 01/14/2007  . Chronic pain syndrome 01/14/2007  . Clinical depression 01/14/2007  . H/O peptic ulcer 01/14/2007  . Benign essential HTN 01/14/2007  . Meniere's disease 01/14/2007    Past Surgical History  Procedure Laterality Date  . Cesarean section    . Breast biopsy      Left, benign  . Tonsillectomy    . Knee surgery      Left  . Shoulder surgery      Left    Past Surgical History  Procedure Laterality Date  . Cesarean section    . Breast biopsy      Left, benign  . Tonsillectomy    . Knee surgery      Left  . Shoulder surgery      Left    Current Outpatient Rx  Name  Route  Sig  Dispense  Refill  . amLODipine (NORVASC) 10 MG tablet   Oral   Take 0.5 tablets by mouth daily.         Marland Kitchen  ARIPiprazole (ABILIFY) 15 MG tablet   Oral   Take 15 mg by mouth at bedtime.           Marland Kitchen aspirin 81 MG EC tablet   Oral   Take 81 mg by mouth daily.           Marland Kitchen b complex vitamins capsule   Oral   Take 1 capsule by mouth Once PRN.           Marland Kitchen Bismuth Subsalicylate (PEPTO-BISMOL PO)   Oral   Take by mouth daily as needed.         . Black Currant Seed Oil 500 MG CAPS   Oral   Take 1 tablet by mouth 2 (two) times daily.         . budesonide-formoterol (SYMBICORT) 160-4.5 MCG/ACT inhaler   Inhalation   Inhale 2 puffs into the lungs 2 (two) times daily.   1 Inhaler   12   . cefdinir  (OMNICEF) 300 MG capsule   Oral   Take 2 capsules (600 mg total) by mouth daily.   20 capsule   0   . citalopram (CELEXA) 20 MG tablet   Oral   Take 20 mg by mouth daily.           . clonazePAM (KLONOPIN) 1 MG tablet   Oral   Take by mouth. Take 1-2 tablets daily and 2 tablets at bedtime.          Marland Kitchen dexlansoprazole (DEXILANT) 60 MG capsule   Oral   Take 1 capsule (60 mg total) by mouth daily.   90 capsule   3   . Dextromethorphan-Guaifenesin (MUCUS RELIEF DM COUGH) 20-400 MG TABS   Oral   Take 1 tablet by mouth every 4 (four) hours as needed.         . diclofenac (VOLTAREN) 75 MG EC tablet   Oral   Take 1 tablet by mouth 2 (two) times daily as needed.         . diclofenac sodium (VOLTAREN) 1 % GEL      APPLY TO AFFECTED AREA FOUR TIMES A DAY AS NEEDED   300 g   1   . Diclofenac-Misoprostol (ARTHROTEC) 75-0.2 MG TBEC   Oral   Take 1 tablet by mouth 2 (two) times daily.         . fluticasone (FLONASE) 50 MCG/ACT nasal spray      USE 2 SPRAYS IN EACH NOSTRIL EVERY DAY   48 g   5   . glucose blood (ACCU-CHEK AVIVA PLUS) test strip      Use to check blood sugar once a day. E11.9   100 each   4   . HYDROcodone-acetaminophen (NORCO/VICODIN) 5-325 MG tablet   Oral   Take 1-2 tablets by mouth every 6 (six) hours as needed for moderate pain. 1-2 Tablets every 4-6 hours as needed   135 tablet   0   . loratadine (CLARITIN) 10 MG tablet   Oral   Take 1 tablet by mouth daily.         Marland Kitchen losartan-hydrochlorothiazide (HYZAAR) 50-12.5 MG per tablet      TAKE 1 TABLET EVERY DAY   90 tablet   3   . metFORMIN (GLUCOPHAGE-XR) 500 MG 24 hr tablet   Oral   Take 1 tablet (500 mg total) by mouth daily with breakfast.   90 tablet   4   . montelukast (SINGULAIR) 10 MG tablet  TAKE 1 TABLET EVERY DAY   90 tablet   3   . Omega 3-6-9 Fatty Acids (OMEGA 3-6-9 COMPLEX) CAPS   Oral   Take 1 capsule by mouth daily.         . predniSONE (DELTASONE) 20  MG tablet   Oral   Take 2 tablets (40 mg total) by mouth daily.   10 tablet   0   . Probiotic Product (PROBIOTIC PO)   Oral   Take 3 tablets by mouth daily.         . ranitidine (ZANTAC) 150 MG capsule   Oral   Take 1 capsule (150 mg total) by mouth 2 (two) times daily.   180 capsule   3   . simvastatin (ZOCOR) 20 MG tablet   Oral   Take 20 mg by mouth at bedtime.           . valACYclovir (VALTREX) 1000 MG tablet   Oral   Take 1 tablet by mouth. Every 8 hours for seven days as needed         . VENTOLIN HFA 108 (90 BASE) MCG/ACT inhaler      INHALE 2 PUFFS EVERY 6 HOURS AS NEEDED   54 g   1   . vitamin C (ASCORBIC ACID) 250 MG tablet   Oral   Take 4 tablets by mouth daily.           Allergies:  Levaquin ; Penicillins; and Sulfa antibiotics  Family History: Family History  Problem Relation Age of Onset  . Heart attack Mother 50    MI  . Cancer Father     skin with mets to prostate and brain  . COPD Father   . Heart disease Sister     Palpitations  . Diabetes Sister   . Obesity Sister   . Schizophrenia Sister   . COPD Sister   . Cancer Maternal Aunt     Leukemia    Social History: Social History  Substance Use Topics  . Smoking status: Current Every Day Smoker -- 1.00 packs/day    Types: Cigarettes    Last Attempt to Quit: 03/18/2009  . Smokeless tobacco: None  . Alcohol Use: 0.0 oz/week    0 Standard drinks or equivalent per week     Comment: occasional use; 4 times a  year     Review of Systems:   10 point review of systems was performed and was otherwise negative:  Constitutional: No fever Eyes: No visual disturbances ENT: No sore throat, ear pain Cardiac: No chest pain Respiratory: No shortness of breath, wheezing, or stridor Abdomen: No abdominal pain, no vomiting, No diarrhea Endocrine: No weight loss, No night sweats Extremities: No peripheral edema, cyanosis Skin: No rashes, easy bruising Neurologic: No focal weakness,  trouble with speech or swollowing Urologic: No dysuria, Hematuria, or urinary frequency   Physical Exam:  ED Triage Vitals  Enc Vitals Group     BP 09/12/15 1623 145/71 mmHg     Pulse Rate 09/12/15 1623 86     Resp 09/12/15 1623 20     Temp 09/12/15 1623 98.2 F (36.8 C)     Temp Source 09/12/15 1623 Oral     SpO2 09/12/15 1623 93 %     Weight 09/12/15 1623 253 lb (114.76 kg)     Height 09/12/15 1623 5\' 7"  (1.702 m)     Head Cir --      Peak Flow --  Pain Score 09/12/15 1624 7     Pain Loc --      Pain Edu? --      Excl. in [place]? --     General: Awake , Alert , and Oriented times 3; GCS 15 Head: Normal cephalic , atraumatic Eyes: Pupils equal , round, reactive to light Nose/Throat: No nasal drainage, patent upper airway without erythema or exudate.  Neck: Supple, Full range of motion, No anterior adenopathy or palpable thyroid masses Lungs: Clear to ascultation without wheezes , rhonchi, or rales Heart: Regular rate, regular rhythm without murmurs , gallops , or rubs Abdomen: Soft, non tender without rebound, guarding , or rigidity; bowel sounds positive and symmetric in all 4 quadrants. No organomegaly .        Extremities: 2 plus symmetric pulses. No edema, clubbing or cyanosis Neurologic: normal ambulation, Motor symmetric without deficits, sensory intact Skin: warm, dry, no rashes Patient does have a mild reproducible component to the pain with palpation across the lower chest wall region. She also has some mild tenderness to deep palpation left upper chest wall without crepitus or step-off noted  Labs:   All laboratory work was reviewed including any pertinent negatives or positives listed below:  Labs Reviewed  BASIC METABOLIC PANEL - Abnormal; Notable for the following:    Chloride 99 (*)    Glucose, Bld 163 (*)    All other components within normal limits  CBC - Abnormal; Notable for the following:    MCHC 36.4 (*)    All other components within normal limits   TROPONIN I  Laboratory work was reviewed and showed no clinically significant abnormalities.   EKG:  ED ECG REPORT I, Daymon Larsen, the attending physician, personally viewed and interpreted this ECG.  Date: 09/12/2015 EKG Time: 1624* Rate: 59 Rhythm: normal sinus rhythm QRS Axis: normal Intervals: normal ST/T Wave abnormalities: Nonspecific T wave abnormality Conduction Disturbances: none Narrative Interpretation: unremarkable No obvious significant change in comparison to 09-01-2011   Radiology:    DG Chest 2 View (Final result) Result time: 09/12/15 16:53:03   Final result by Rad Results In Interface (09/12/15 16:53:03)   Narrative:   CLINICAL DATA: Productive cough for 1 month.  EXAM: CHEST 2 VIEW  COMPARISON: None.  FINDINGS: Normal mediastinum and cardiac silhouette. Normal pulmonary vasculature. No evidence of effusion, infiltrate, or pneumothorax. No acute bony abnormality.  IMPRESSION: No acute cardiopulmonary process.   Electronically Signed By: Val Eagle         I personally reviewed the radiologic studies    ED Course: * Differential includes all life-threatening causes for chest pain. This includes but is not exclusive to acute coronary syndrome, aortic dissection, pulmonary embolism, cardiac tamponade, community-acquired pneumonia, pericarditis, musculoskeletal chest wall pain, etc. Patient's stay here was uneventful and I felt since she's had this pain intermittently for 3 days with an unchanged EKG and negative troponin that this was unlikely to be acute coronary syndrome. Her x-rays negative for pneumonia and I felt with his history of cough and somewhat pleuritic discomfort that this most likely was bronchitis with musculoskeletal pain. I felt was unlikely to be a pulmonary embolism displaced on the clinical presentation   Assessment:  Acute unspecified chest pain   Final Clinical Impression:   Final diagnoses:   Bronchitis  Chest pain, unspecified chest pain type     Plan:  Outpatient management A prescription for prednisone Patient was advised to return immediately if condition worsens. Patient was advised to follow  up with their primary care physician or other specialized physicians involved in their outpatient care. The patient and/or family member/power of attorney had laboratory results reviewed at the bedside. All questions and concerns were addressed and appropriate discharge instructions were distributed by the nursing staff.            Daymon Larsen, MD 09/12/15 [phone]

## 2015-10-12 ENCOUNTER — Telehealth: Payer: Self-pay | Admitting: Family Medicine

## 2015-10-12 DIAGNOSIS — M5137 Other intervertebral disc degeneration, lumbosacral region: Secondary | ICD-10-CM

## 2015-10-12 MED ORDER — HYDROCODONE-ACETAMINOPHEN 5-325 MG PO TABS: 1.0000 | ORAL_TABLET | Freq: Four times a day (QID) | ORAL | 0 refills | Status: DC | PRN

## 2015-10-12 NOTE — Telephone Encounter (Signed)
Pt requesting a refill of HYDROcodone-acetaminophen (NORCO/VICODIN) 5-325 MG tablet sent to pharmacy for 1 mth and then a 3 mth RX sent to mail order.  She states it is much cheaper

## 2015-10-12 NOTE — Telephone Encounter (Signed)
Rx printed and will be up front for pick up. Will defer 3 month supply to Dr. Caryn Section upon his return.

## 2015-10-12 NOTE — Telephone Encounter (Signed)
Pt called to see if her RX for HYDROcodone-acetaminophen (NORCO/VICODIN) 5-325 MG tablet was ready. I advised that refill request can take 24 to 48 hours. Pt stated that she is going to run out of the medication today. Pt is requesting to pick up an RX today for a one month supply and sent a 3 month supply to [facility]. I advised that Dr. Caryn Section is out of the office until 10/17/15 and we would be sending to another provider. Please advise. Thanks TNP

## 2015-10-12 NOTE — Telephone Encounter (Signed)
Please review. Thanks!  

## 2015-11-01 ENCOUNTER — Encounter: Payer: Commercial Managed Care - HMO | Admitting: Family Medicine

## 2015-11-15 ENCOUNTER — Encounter: Payer: Commercial Managed Care - HMO | Admitting: Family Medicine

## 2015-11-30 ENCOUNTER — Encounter: Payer: Self-pay | Admitting: Family Medicine

## 2015-11-30 ENCOUNTER — Ambulatory Visit (INDEPENDENT_AMBULATORY_CARE_PROVIDER_SITE_OTHER): Payer: Commercial Managed Care - HMO | Admitting: Family Medicine

## 2015-11-30 VITALS — BP 110/60 | HR 72 | Temp 98.1°F | Resp 16 | Ht 67.0 in | Wt 245.0 lb

## 2015-11-30 DIAGNOSIS — Z0000 Encounter for general adult medical examination without abnormal findings: Secondary | ICD-10-CM | POA: Diagnosis not present

## 2015-11-30 DIAGNOSIS — J42 Unspecified chronic bronchitis: Secondary | ICD-10-CM

## 2015-11-30 DIAGNOSIS — R5382 Chronic fatigue, unspecified: Secondary | ICD-10-CM | POA: Diagnosis not present

## 2015-11-30 DIAGNOSIS — I10 Essential (primary) hypertension: Secondary | ICD-10-CM | POA: Diagnosis not present

## 2015-11-30 DIAGNOSIS — E119 Type 2 diabetes mellitus without complications: Secondary | ICD-10-CM

## 2015-11-30 DIAGNOSIS — K219 Gastro-esophageal reflux disease without esophagitis: Secondary | ICD-10-CM | POA: Diagnosis not present

## 2015-11-30 DIAGNOSIS — F329 Major depressive disorder, single episode, unspecified: Secondary | ICD-10-CM

## 2015-11-30 DIAGNOSIS — M5137 Other intervertebral disc degeneration, lumbosacral region: Secondary | ICD-10-CM

## 2015-11-30 DIAGNOSIS — G9332 Myalgic encephalomyelitis/chronic fatigue syndrome: Secondary | ICD-10-CM

## 2015-11-30 DIAGNOSIS — R768 Other specified abnormal immunological findings in serum: Secondary | ICD-10-CM

## 2015-11-30 DIAGNOSIS — F32A Depression, unspecified: Secondary | ICD-10-CM

## 2015-11-30 DIAGNOSIS — R1011 Right upper quadrant pain: Secondary | ICD-10-CM

## 2015-11-30 DIAGNOSIS — G894 Chronic pain syndrome: Secondary | ICD-10-CM | POA: Diagnosis not present

## 2015-11-30 DIAGNOSIS — M51379 Other intervertebral disc degeneration, lumbosacral region without mention of lumbar back pain or lower extremity pain: Secondary | ICD-10-CM

## 2015-11-30 MED ORDER — ESCITALOPRAM OXALATE 20 MG PO TABS: 20.0000 mg | ORAL_TABLET | Freq: Every day | ORAL | 1 refills | Status: DC

## 2015-11-30 MED ORDER — HYDROCODONE-ACETAMINOPHEN 5-325 MG PO TABS: 1.0000 | ORAL_TABLET | Freq: Four times a day (QID) | ORAL | 0 refills | Status: DC | PRN

## 2015-11-30 NOTE — Progress Notes (Signed)
Patient: Misty Bishop, Female    DOB: 12-01-1955, 60 y.o.   MRN: [id number] Visit Date: 11/30/2015  Today's Provider: Lelon Huh, MD   Chief Complaint  Patient presents with  . Annual Exam   Subjective:   Patient presents for yearly physical exam, follow up multiple chronic medication problems, and evaluation or new or worsening symptoms.    Annual physical exam Misty Bishop is a 60 y.o. female who presents today for health maintenance and complete physical. She feels poorly. She reports exercising daily (walking). She reports she is sleeping poorly.  05/20/14 CPE 10/06/14 Mammogram-BI-RADS 1 04/10/09 Pap-ref to GYN due to abnormal paps Never had colonoscopy. Ordered Cologuard last year but she did not collect samples. She would like this re-ordered.  She goes to [facility] for pap/breast/pelvic exams.  -----------------------------------------------------------------  Hypertension, follow-up:  BP Readings from Last 3 Encounters:  11/30/15 110/60  09/12/15 138/78  08/16/15 132/82    She was last seen for hypertension 1 years ago.  BP at that visit was 122/70. Management changes since that visit include no changes. She reports excellent compliance with treatment. She is having side effects.  She is exercising. She is adherent to low salt diet.   Outside blood pressures are stable. She is experiencing fatigue.  Patient denies chest pain.   Cardiovascular risk factors include diabetes mellitus, hypertension, obesity (BMI >= 30 kg/m2) and smoking/ tobacco exposure.  Use of agents associated with hypertension: none.   ------------------------------------------------------------------------   Follow up for COPD  The patient was last seen for this 1 years ago. Changes made at last visit include no changes.  She reports excellent compliance with treatment. She feels that condition is Unchanged. She is not having side effects.    ------------------------------------------------------------------------------------   Diabetes Mellitus Type II, Follow-up:   Lab Results  Component Value Date   HGBA1C 6.8 04/18/2015   HGBA1C 6.9 (A) 06/09/2014   HGBA1C 5.8 09/02/2011    Last seen for diabetes 9 months ago.  Management since then includes started metformin. She reports excellent compliance with treatment. She is not having side effects.  Current symptoms include paresthesia of the feet and visual disturbances and have been stable. Home blood sugar records: not being checked  Episodes of hypoglycemia? no   Current Insulin Regimen: none Most Recent Eye Exam: not up to date Weight trend: stable Prior visit with dietician: no Current diet: in general, a "healthy" diet   Current exercise: walking  Pertinent Labs:    Component Value Date/Time   CHOL 185 06/09/2014   CHOL 183 09/02/2011 0208   TRIG 139 06/09/2014   TRIG 214 (H) 09/02/2011 0208   HDL 37 06/09/2014   HDL 55 09/02/2011 0208   LDLCALC 120 06/09/2014   LDLCALC 85 09/02/2011 0208   CREATININE 0.89 09/12/2015 1623   CREATININE 1.01 09/02/2011 0208    Chronic fatigue/depression/anxiety  Patient c/o worsening fatigue for 1 year. Patient reports joint pain all over body, She has long history of chronic fatigue syndrome and had + RNP ANA in 2015 and referred [facility] rheumatology and repeat ANA was negative. She has long history of chronic fatigue syndrome which she was told was due to remote EBV infection. She had been on Lexapro and clonazepam until she ran out about six months. She had previously been treated for depression and BPAD by Dr. Kasandra Knudsen, but has not been able to follow up with him because he is no longer in her  network.    She reports episode of  nausea severe  Right upper quadrant abdominal pain in the middle of the night about 2 weeks ago  Follow up hyperlipidemia. Has not bee taking simvastatin since prescription ran out, but had no side  effects. Is willing to take high intensity statin.    Review of Systems  Constitutional: Positive for fatigue.       Crying  HENT: Positive for congestion, dental problem, ear discharge, mouth sores, postnasal drip and sinus pressure.   Eyes: Positive for redness and visual disturbance.  Respiratory: Positive for cough, shortness of breath and wheezing.   Cardiovascular: Positive for leg swelling.  Gastrointestinal: Positive for abdominal distention, abdominal pain and nausea.  Endocrine: Negative.   Genitourinary: Negative.   Musculoskeletal: Positive for arthralgias, back pain, joint swelling and neck pain.  Skin: Negative.   Allergic/Immunologic: Positive for environmental allergies.  Neurological: Positive for dizziness, weakness and numbness.  Hematological: Bruises/bleeds easily.  Psychiatric/Behavioral: Positive for agitation, decreased concentration and sleep disturbance. The patient is nervous/anxious.     Social History      She  reports that she has been smoking Cigarettes.  She has been smoking about 1.00 pack per day. She does not have any smokeless tobacco history on file. She reports that she drinks alcohol. She reports that she does not use drugs.       Social History   Social History  . Marital status: Single    Spouse name: Misty Bishop  . Number of children: 1  . Years of education: Misty Bishop   Occupational History  . Disabled      disabeled due to chronic pain in low back- approved in 08/2005   Social History Main Topics  . Smoking status: Current Every Day Smoker    Packs/day: 1.00    Types: Cigarettes    Last attempt to quit: 03/18/2009  . Smokeless tobacco: None  . Alcohol use 0.0 oz/week     Comment: occasional use; 4 times a  year  . Drug use: No  . Sexual activity: Not Asked   Other Topics Concern  . None   Social History Narrative   Disabled (back pain)   Originally from [place]   Has a daughter   Does not get regular exercise    Past Medical  History:  Diagnosis Date  . Bipolar disorder ([place])   . Depression   . Headache(784.0)   . Palpitations    Holter monitor in 2008 showed PVCs  . Peptic ulcer disease   . PTSD (post-traumatic stress disorder)      Patient Active Problem List   Diagnosis Date Noted  . Hyperlipemia   . Bipolar disorder ([place])   . Anxiety 11/08/2014  . Ache in joint 11/08/2014  . Back ache 11/08/2014  . COPD (chronic obstructive pulmonary disease) ([place]) 11/08/2014  . Degeneration of lumbar or lumbosacral intervertebral disc 11/08/2014  . Diabetes mellitus, type 2 ([place]) 11/08/2014  . Difficulty hearing 11/08/2014  . Cold sore 11/08/2014  . Personal history of reproductive and obstetrical problems 11/08/2014  . Symptomatic menopausal or female climacteric states 11/08/2014  . Sciatica 11/08/2014  . Chronic sinusitis 11/08/2014  . Non-alcoholic fatty liver disease 11/08/2014  . Compulsive tobacco user syndrome 11/08/2014  . Tremor 11/08/2014  . Achrochordon 11/08/2014  . Cutaneous skin tags 11/08/2014  . GERD (gastroesophageal reflux disease) 08/19/2014  . ANA positive 12/29/2013  . Arthritis, degenerative 12/29/2013  . CHEST PAIN-UNSPECIFIED 07/13/2009  . HYPERTENSION, BENIGN 07/11/2009  .  SHORTNESS OF BREATH 07/11/2009  . LBP (low back pain) 03/01/2007  . Allergic rhinitis 01/14/2007  . Chronic pain syndrome 01/14/2007  . Clinical depression 01/14/2007  . H/O peptic ulcer 01/14/2007  . Benign essential HTN 01/14/2007  . Meniere's disease 01/14/2007    Past Surgical History:  Procedure Laterality Date  . BREAST BIOPSY     Left, benign  . CESAREAN SECTION    . KNEE SURGERY     Left  . SHOULDER SURGERY     Left  . TONSILLECTOMY      Family History        Family Status  Relation Status  . Mother Deceased at age 64   died from MI  . Father Deceased at age 39   Skin cancer spread to prostate  . Sister Alive   paranoid schizophrenic  . Maternal Aunt Deceased  . Sister   .  Sister         Her family history includes COPD in her father and sister; Cancer in her father and maternal aunt; Diabetes in her sister; Heart attack (age of onset: 68) in her mother; Heart disease in her sister; Obesity in her sister; Schizophrenia in her sister.    Allergies  Allergen Reactions  . Levaquin  [Levofloxacin In D5w]     lip swelling, SOB.  Marland Kitchen Penicillins     Pt tolerates Cefdinir  . Sulfa Antibiotics Other (See Comments) and Itching    Current Meds  Medication Sig  . amLODipine (NORVASC) 10 MG tablet Take 0.5 tablets by mouth daily.  Marland Kitchen aspirin 81 MG EC tablet Take 81 mg by mouth daily.    Marland Kitchen b complex vitamins capsule Take 1 capsule by mouth Once PRN.    . budesonide-formoterol (SYMBICORT) 160-4.5 MCG/ACT inhaler Inhale 2 puffs into the lungs 2 (two) times daily.  . clonazePAM (KLONOPIN) 1 MG tablet Take by mouth. Take 1-2 tablets daily and 2 tablets at bedtime.   Marland Kitchen dexlansoprazole (DEXILANT) 60 MG capsule Take 1 capsule (60 mg total) by mouth daily.  . diclofenac (VOLTAREN) 75 MG EC tablet Take 1 tablet by mouth 2 (two) times daily as needed.  . diclofenac sodium (VOLTAREN) 1 % GEL APPLY TO AFFECTED AREA FOUR TIMES A DAY AS NEEDED  . fluticasone (FLONASE) 50 MCG/ACT nasal spray USE 2 SPRAYS IN EACH NOSTRIL EVERY DAY  . glucose blood (ACCU-CHEK AVIVA PLUS) test strip Use to check blood sugar once a day. E11.9  . HYDROcodone-acetaminophen (NORCO/VICODIN) 5-325 MG tablet Take 1-2 tablets by mouth every 6 (six) hours as needed for moderate pain. 1-2 Tablets every 4-6 hours as needed  . loratadine (CLARITIN) 10 MG tablet Take 1 tablet by mouth daily.  Marland Kitchen losartan-hydrochlorothiazide (HYZAAR) 50-12.5 MG per tablet TAKE 1 TABLET EVERY DAY  . metFORMIN (GLUCOPHAGE-XR) 500 MG 24 hr tablet Take 1 tablet (500 mg total) by mouth daily with breakfast.  . montelukast (SINGULAIR) 10 MG tablet TAKE 1 TABLET EVERY DAY  . Omega 3-6-9 Fatty Acids (OMEGA 3-6-9 COMPLEX) CAPS Take 1 capsule by  mouth daily.  . Probiotic Product (PROBIOTIC PO) Take 3 tablets by mouth daily.  . ranitidine (ZANTAC) 150 MG capsule Take 1 capsule (150 mg total) by mouth 2 (two) times daily.  . VENTOLIN HFA 108 (90 BASE) MCG/ACT inhaler INHALE 2 PUFFS EVERY 6 HOURS AS NEEDED  . vitamin C (ASCORBIC ACID) 250 MG tablet Take 4 tablets by mouth daily.    Patient Care Team: Birdie Sons, MD as  PCP - General (Family Medicine)     Objective:   Vitals: BP 110/60 (BP Location: Left Arm, Patient Position: Sitting, Cuff Size: Large)   Pulse 72   Temp 98.1 F (36.7 C) (Oral)   Resp 16   Ht 5' 7"  (1.702 m)   Wt 245 lb (111.1 kg)   SpO2 97%   BMI 38.37 kg/m    Physical Exam   General Appearance:    Alert, cooperative, no distress, appears stated age, obese  Head:    Normocephalic, without obvious abnormality, atraumatic  Eyes:    PERRL, conjunctiva/corneas clear, EOM's intact, fundi    benign, both eyes  Ears:    Normal TM's and external ear canals, both ears  Nose:   Nares normal, septum midline, mucosa normal, no drainage    or sinus tenderness  Throat:   Lips, mucosa, and tongue normal; teeth and gums normal  Neck:   Supple, symmetrical, trachea midline, no adenopathy;    thyroid:  no enlargement/tenderness/nodules; no carotid   bruit or JVD  Back:     Symmetric, no curvature, ROM normal, no CVA tenderness  Lungs:     Clear to auscultation bilaterally, respirations unlabored  Chest Wall:    No tenderness or deformity   Heart:    Regular rate and rhythm, S1 and S2 normal, no murmur, rub   or gallop  Breast Exam:    deferred  Abdomen:     Soft, non-tender, bowel sounds active all four quadrants,    no masses, no organomegaly  Pelvic:    deferred  Extremities:   Extremities normal, atraumatic, no cyanosis or edema  Pulses:   2+ and symmetric all extremities  Skin:   Skin color, texture, turgor normal, no rashes or lesions  Lymph nodes:   Cervical, supraclavicular, and axillary nodes normal    Neurologic:   CNII-XII intact, normal strength, sensation and reflexes    throughout    Depression Screen PHQ 2/9 Scores 11/30/2015  PHQ - 2 Score 4  PHQ- 9 Score 13      Assessment & Plan:     Routine Health Maintenance and Physical Exam  Exercise Activities and Dietary recommendations Goals    None      Immunization History  Administered Date(s) Administered  . Tdap 01/14/2007    Health Maintenance  Topic Date Due  . PNEUMOCOCCAL POLYSACCHARIDE VACCINE (1) 02/27/1958  . FOOT EXAM  02/27/1966  . OPHTHALMOLOGY EXAM  02/27/1966  . HIV Screening  02/28/1971  . COLONOSCOPY  02/27/2006  . HEMOGLOBIN A1C  10/16/2015  . INFLUENZA VACCINE  10/17/2015  . MAMMOGRAM  10/05/2016  . TETANUS/TDAP  01/13/2017  . PAP SMEAR  06/03/2018  . Hepatitis C Screening  Completed      Discussed health benefits of physical activity, and encouraged her to engage in regular exercise appropriate for her age and condition.    --------------------------------------------------------------------  1. Annual physical exam Re-order Cologuard. Breast/pap deferred to gyn. Given telephone number to schedule mammogram.   2. Degeneration of lumbar or lumbosacral intervertebral disc Due for refill pain medications which she is tolerating well.  - HYDROcodone-acetaminophen (NORCO/VICODIN) 5-325 MG tablet; Take 1-2 tablets by mouth every 6 (six) hours as needed for moderate pain. 1-2 Tablets every 4-6 hours as needed  Dispense: 135 tablet; Refill: 0  3. Gastroesophageal reflux disease, esophagitis presence not specified Controlled with prn dexilant.   4. Type 2 diabetes mellitus without complication, without long-term current use of insulin ([place]) Doing well  on metformin. Due for labs. Does not have eye doctor and does not remember last time she had eye exam - Lipid panel - Hemoglobin A1c - Ambulatory referral to Ophthalmology  5. Benign essential HTN Well controlled. Continue current  medications.    6. Chronic bronchitis, unspecified chronic bronchitis type ([place]) No symptoms today.   7. Chronic pain syndrome  - Sed Rate (ESR) - CK (Creatine Kinase)  8. Chronic fatigue syndrome  - Comprehensive metabolic panel - CBC - TSH  9. Clinical depression Needs to establish with new psychiatrist since Dr. Kasandra Knudsen is no longer in network.  - Ambulatory referral to Psychiatry - escitalopram (LEXAPRO) 20 MG tablet; Take 1 tablet (20 mg total) by mouth daily. Take 1/2 tablet daily for six days, then increase to one full tablet daily  Dispense: 30 tablet; Refill: 1  10.  RUQ pain Episodic, suspicious for gallstones, she does have known history of fatty liver - US Abdomen Limited RUQ; Future  12. ANA positive Had negative rheumatological evaluation, including negative repeat ANA at [facility] in 2015.    Lelon Huh, MD  [facility]

## 2015-12-04 ENCOUNTER — Other Ambulatory Visit: Payer: Self-pay | Admitting: Family Medicine

## 2015-12-04 NOTE — Telephone Encounter (Signed)
Please advise 

## 2015-12-04 NOTE — Telephone Encounter (Signed)
Pt called saying the psy doctor where we referred her to told her (Misty Bishop) that they don't prescribe the klonopin.  She wants to know if you will continue to prescribe this for her.  Her call back is [phone]  Thanks. Con Memos

## 2015-12-05 NOTE — Telephone Encounter (Signed)
She needs to see psychiatrist first. After she establishes with psychiatrist we will make that determination.

## 2015-12-05 NOTE — Telephone Encounter (Signed)
Patient was notified.

## 2015-12-05 NOTE — Telephone Encounter (Signed)
Pt called again and she wants to know if we have an answer regarding prescribing the klonopin.  Please advise.

## 2015-12-07 ENCOUNTER — Ambulatory Visit: Payer: Medicare HMO

## 2015-12-18 ENCOUNTER — Other Ambulatory Visit: Payer: Self-pay

## 2015-12-18 DIAGNOSIS — F329 Major depressive disorder, single episode, unspecified: Secondary | ICD-10-CM

## 2015-12-18 DIAGNOSIS — F32A Depression, unspecified: Secondary | ICD-10-CM

## 2015-12-18 MED ORDER — ESCITALOPRAM OXALATE 20 MG PO TABS: 20.0000 mg | ORAL_TABLET | Freq: Every day | ORAL | 0 refills | Status: DC

## 2015-12-18 NOTE — Telephone Encounter (Signed)
Pharmacy request a 90 day supply. We refilled this medication recently on 11/30/2015 for a qty of 30. I put in another order for Psychiatry referral since the original referral done on 11/30/2015 did not drop into Sarah's work [facility].

## 2015-12-18 NOTE — Progress Notes (Unsigned)
Please schedule Psychiatry referral per Dr, Caryn Section. Thanks. Order was initially placed 11/30/2015. During office visit

## 2016-01-25 ENCOUNTER — Other Ambulatory Visit: Payer: Self-pay | Admitting: Family Medicine

## 2016-04-05 IMAGING — CR DG ANKLE COMPLETE 3+V*L*
3 series · 3 of 3 positions shown · non-contrast
Comparison: None.

CLINICAL DATA: Chronic left ankle pain and swelling, no acute
injury

EXAM:
LEFT ANKLE COMPLETE - 3+ VIEW

[ankle ap]
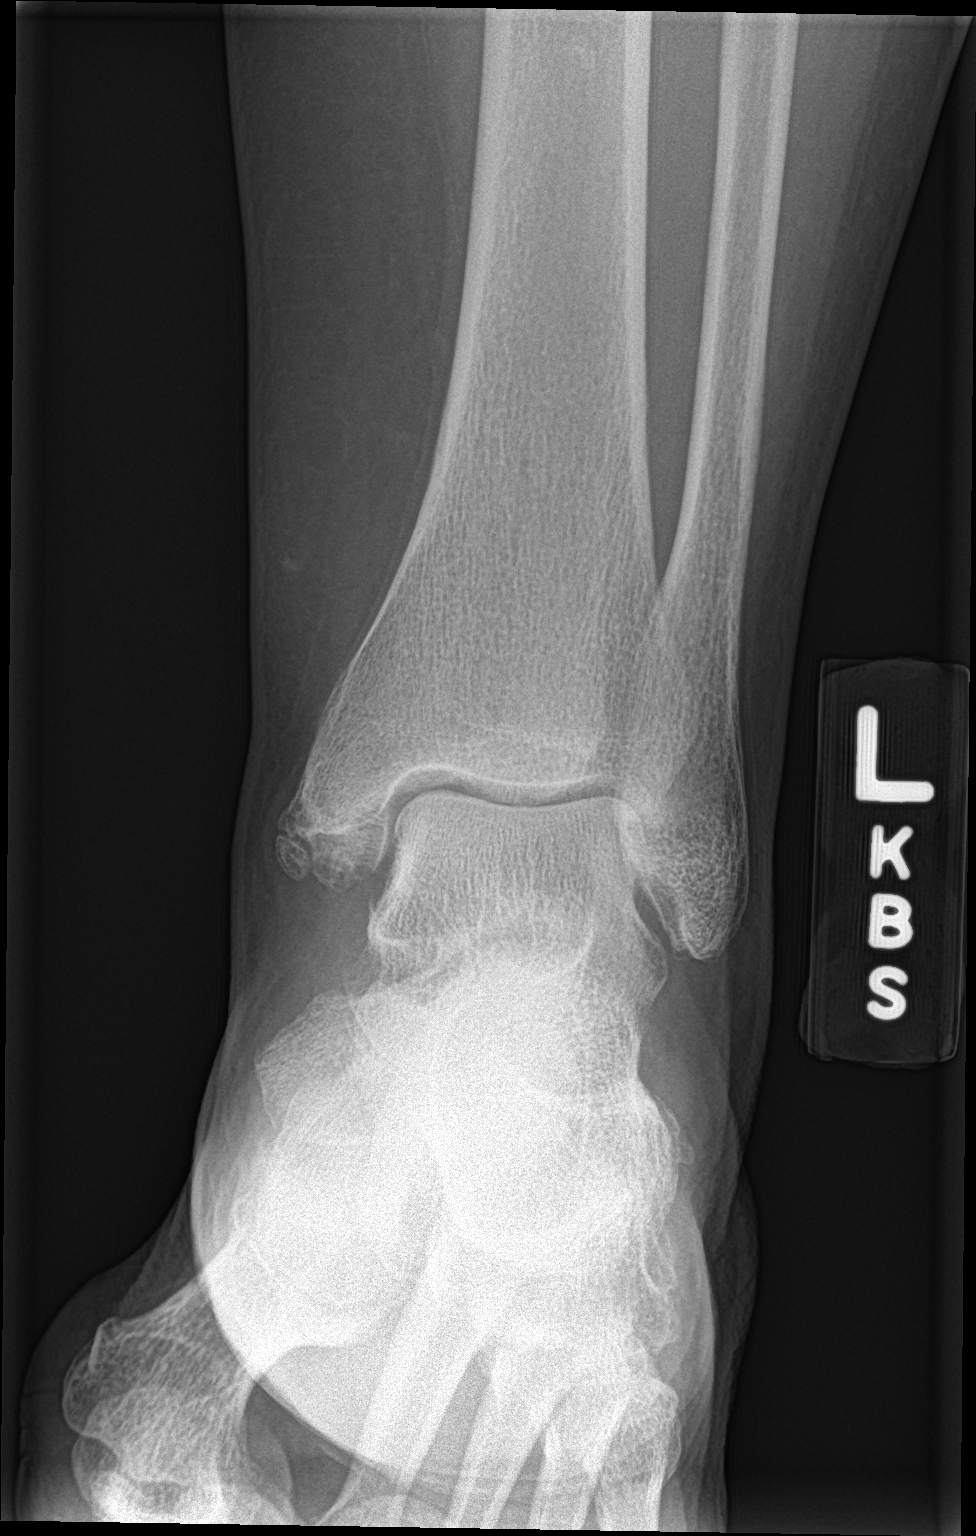

[ankle obl]
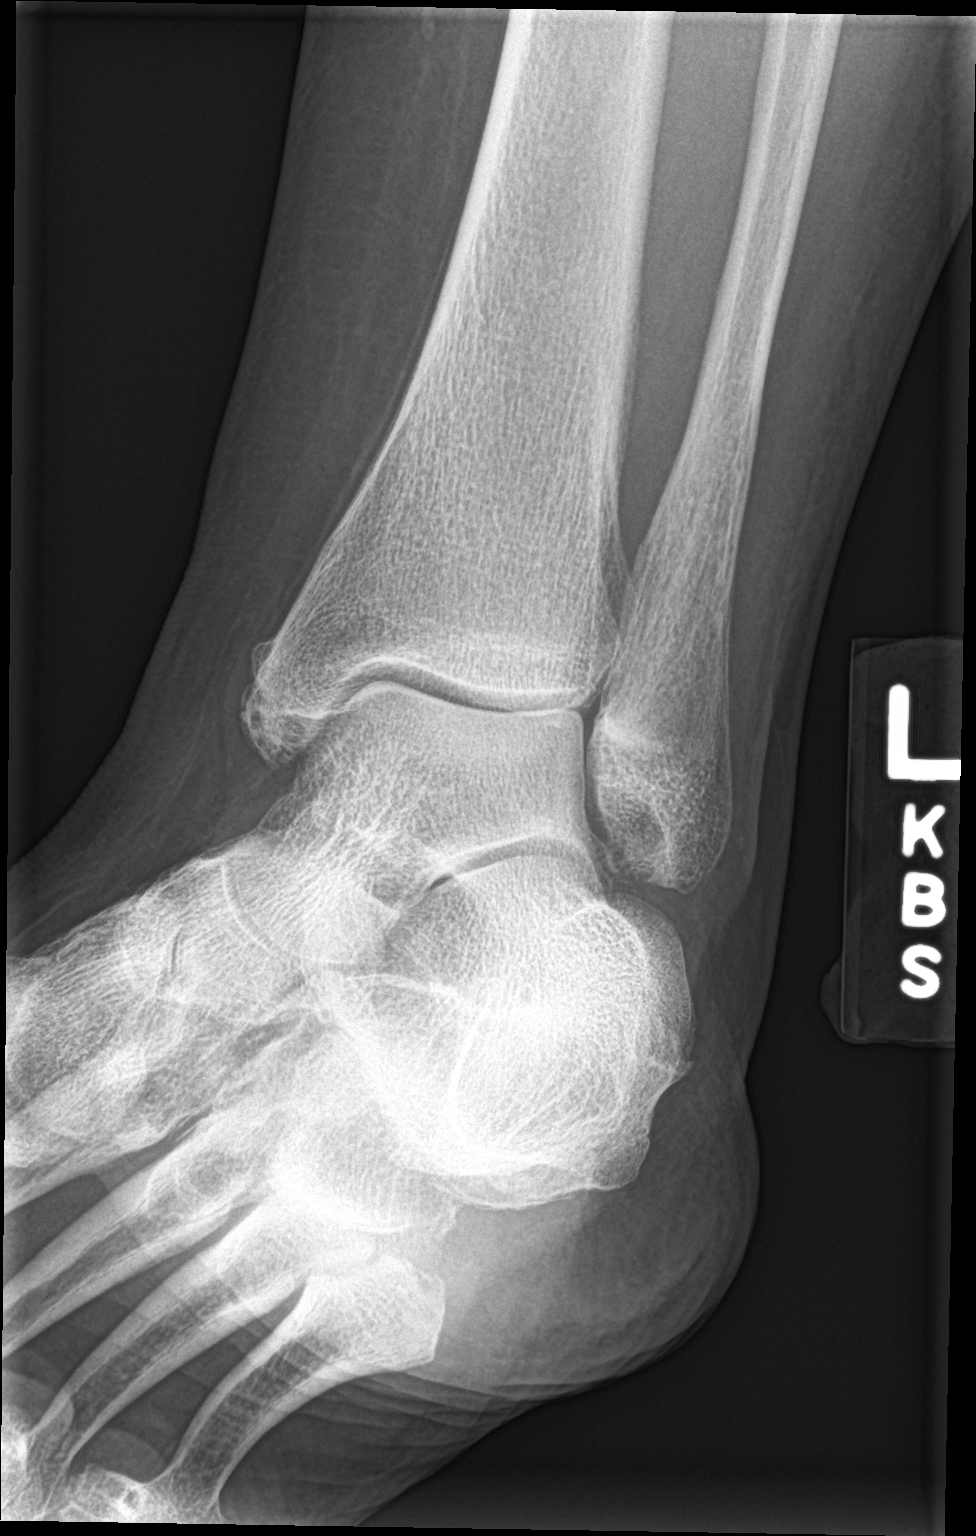

[ankle lat]
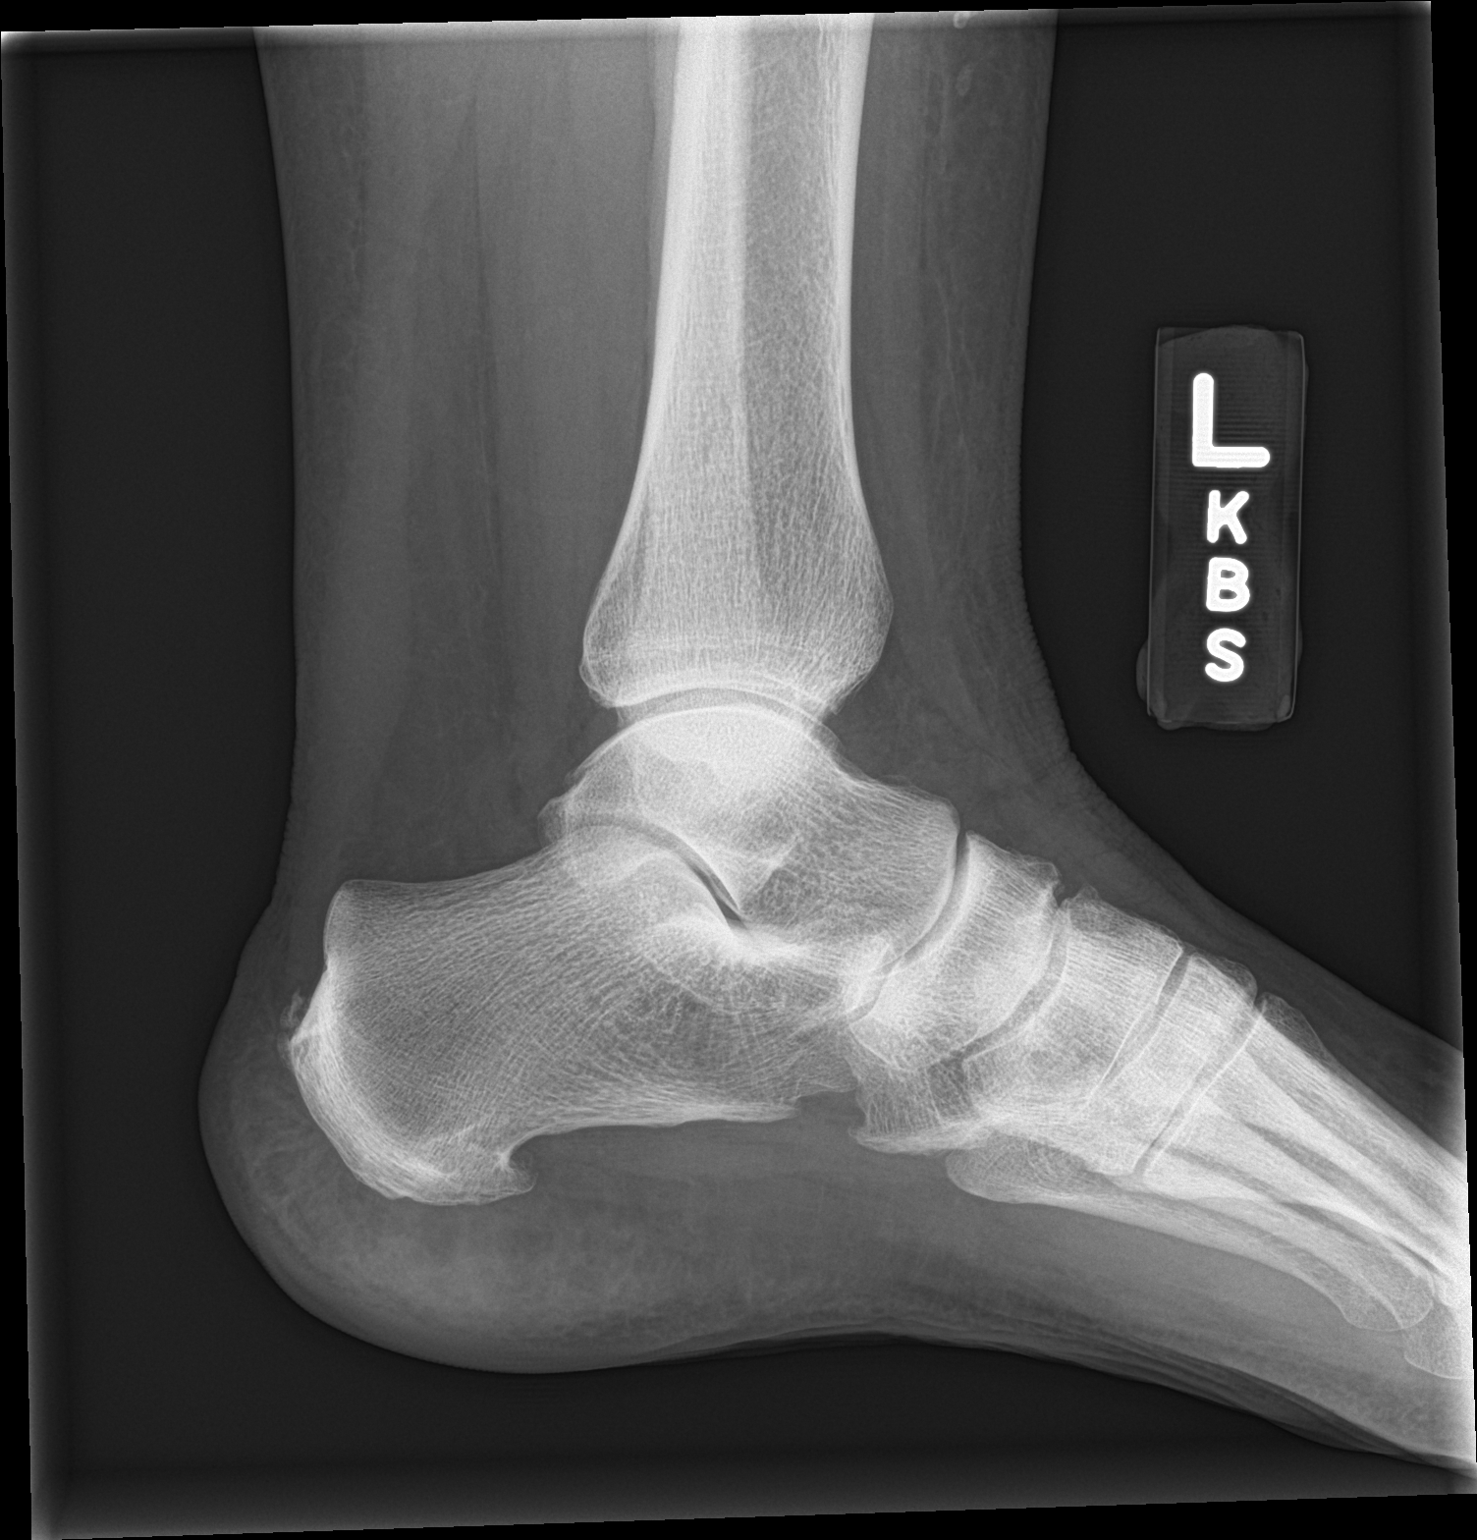

[3 of 3 positions shown; findings below may reference images not displayed]

FINDINGS: There is some early irregularity of the medial malleolar region with
there is a well corticated bony fragment lateral to the tip most
consistent with and old avulsion injury. No acute fracture is seen.
The ankle joint appears normal. A plantar degenerative calcaneal
spur is present.
IMPRESSION: No acute abnormality. Probable old traumatic change of the medial
malleolus. Plantar calcaneal degenerative spur.

## 2016-05-09 ENCOUNTER — Ambulatory Visit (INDEPENDENT_AMBULATORY_CARE_PROVIDER_SITE_OTHER): Payer: Medicare HMO

## 2016-05-09 ENCOUNTER — Encounter: Payer: Self-pay | Admitting: *Deleted

## 2016-05-09 ENCOUNTER — Ambulatory Visit
Admission: EM | Admit: 2016-05-09 | Discharge: 2016-05-09 | Disposition: A | Discharge status: Home / Self Care | Payer: Medicare HMO | Attending: Family Medicine | Admitting: Family Medicine

## 2016-05-09 DIAGNOSIS — R0602 Shortness of breath: Secondary | ICD-10-CM | POA: Diagnosis not present

## 2016-05-09 DIAGNOSIS — J069 Acute upper respiratory infection, unspecified: Secondary | ICD-10-CM

## 2016-05-09 DIAGNOSIS — R05 Cough: Secondary | ICD-10-CM | POA: Diagnosis not present

## 2016-05-09 MED ORDER — BENZONATATE 200 MG PO CAPS: 200.0000 mg | ORAL_CAPSULE | Freq: Three times a day (TID) | ORAL | 0 refills | Status: DC | PRN

## 2016-05-09 MED ORDER — CEFUROXIME AXETIL 500 MG PO TABS: 500.0000 mg | ORAL_TABLET | Freq: Two times a day (BID) | ORAL | 0 refills | Status: DC

## 2016-05-09 MED ORDER — FEXOFENADINE-PSEUDOEPHED ER 180-240 MG PO TB24: 1.0000 | ORAL_TABLET | Freq: Every day | ORAL | 0 refills | Status: DC

## 2016-05-09 NOTE — ED Triage Notes (Signed)
Non-productive cough, chills, fever, head and chest congestion, fever, x2 weeks. OTC meds not working anymore.

## 2016-05-09 NOTE — ED Provider Notes (Signed)
[facility]    CSN: [id number] Arrival date & time: 05/09/16  1049     History   Chief Complaint Chief Complaint  Patient presents with  . Chills  . Fatigue  . Cough  . Fever    HPI Misty Bishop is a 61 y.o. female.   Patient states a 61 year old white female with multiple medical problems chronic fatigue which immune system fibromyalgia diabetes etc. and etc. She comes in today with a 4 weeks history of coughing congestion when back in 4 waxing and waning. From her history sounds like she may have 2 different strains of flu over the last 4 weeks with this fluctuating course of hitting her then mild symptoms and then all of a sudden returned with more symptoms. The last few days the cough congestion has gotten worse. She has abnormal sinuses according to her ENT Dr. Ladene Artist. Multiple multiple medical problems please see list she's got a history in the family of schizophrenia heart disease hypertension and she smokes over at least a pack cigarettes a day. She is allergic to Levaquin and penicillin and sulfa   The history is provided by the patient. No language interpreter was used.  Cough  Cough characteristics:  Productive and croupy Sputum characteristics:  Yellow and green Severity:  Moderate Timing:  Intermittent Progression:  Waxing and waning Chronicity:  New Smoker: yes   Context: upper respiratory infection   Relieved by:  Nothing Worsened by:  Exposure to cold air and activity Ineffective treatments:  Decongestant, fluids, rest and cough suppressants Associated symptoms: fever, myalgias and shortness of breath   Fever  Associated symptoms: cough and myalgias     Past Medical History:  Diagnosis Date  . Bipolar disorder ([place])   . Depression   . Headache(784.0)   . Palpitations    Holter monitor in 2008 showed PVCs  . Peptic ulcer disease   . PTSD (post-traumatic stress disorder)     Patient Active Problem List   Diagnosis Date Noted    . Chronic fatigue syndrome 11/30/2015  . Hyperlipemia   . Bipolar disorder ([place])   . Anxiety 11/08/2014  . Ache in joint 11/08/2014  . Back ache 11/08/2014  . COPD (chronic obstructive pulmonary disease) ([place]) 11/08/2014  . Degeneration of lumbar or lumbosacral intervertebral disc 11/08/2014  . Diabetes mellitus, type 2 ([place]) 11/08/2014  . Difficulty hearing 11/08/2014  . Cold sore 11/08/2014  . Personal history of reproductive and obstetrical problems 11/08/2014  . Symptomatic menopausal or female climacteric states 11/08/2014  . Sciatica 11/08/2014  . Chronic sinusitis 11/08/2014  . Non-alcoholic fatty liver disease 11/08/2014  . Compulsive tobacco user syndrome 11/08/2014  . Tremor 11/08/2014  . Achrochordon 11/08/2014  . Cutaneous skin tags 11/08/2014  . GERD (gastroesophageal reflux disease) 08/19/2014  . ANA positive 12/29/2013  . Arthritis, degenerative 12/29/2013  . CHEST PAIN-UNSPECIFIED 07/13/2009  . SHORTNESS OF BREATH 07/11/2009  . LBP (low back pain) 03/01/2007  . Allergic rhinitis 01/14/2007  . Chronic pain syndrome 01/14/2007  . Clinical depression 01/14/2007  . H/O peptic ulcer 01/14/2007  . Benign essential HTN 01/14/2007  . Meniere's disease 01/14/2007    Past Surgical History:  Procedure Laterality Date  . BREAST BIOPSY     Left, benign  . CESAREAN SECTION    . KNEE SURGERY     Left  . SHOULDER SURGERY     Left  . TONSILLECTOMY      OB History    Gravida Para Term Preterm  AB Living   2 1           SAB TAB Ectopic Multiple Live Births                   Home Medications    Prior to Admission medications   Medication Sig Start Date End Date Taking? Authorizing Provider  amLODipine (NORVASC) 10 MG tablet Take 0.5 tablets by mouth daily. 05/17/13  Yes Historical Provider, MD  aspirin 81 MG EC tablet Take 81 mg by mouth daily.     Yes Historical Provider, MD  b complex vitamins capsule Take 1 capsule by mouth Once PRN.     Yes Historical  Provider, MD  budesonide-formoterol (SYMBICORT) 160-4.5 MCG/ACT inhaler Inhale 2 puffs into the lungs 2 (two) times daily. 08/16/15  Yes Birdie Sons, MD  clonazePAM (KLONOPIN) 1 MG tablet Take by mouth. Take 1-2 tablets daily and 2 tablets at bedtime.    Yes Historical Provider, MD  dexlansoprazole (DEXILANT) 60 MG capsule Take 1 capsule (60 mg total) by mouth daily. 09/12/14  Yes Birdie Sons, MD  diclofenac (VOLTAREN) 75 MG EC tablet Take 1 tablet by mouth 2 (two) times daily as needed. 12/20/13  Yes Historical Provider, MD  diclofenac sodium (VOLTAREN) 1 % GEL APPLY TO AFFECTED AREA FOUR TIMES A DAY AS NEEDED 02/20/15  Yes Birdie Sons, MD  escitalopram (LEXAPRO) 20 MG tablet Take 1 tablet (20 mg total) by mouth daily. Take 1/2 tablet daily for six days, then increase to one full tablet daily 12/18/15  Yes Birdie Sons, MD  fluticasone [facility]) 50 MCG/ACT nasal spray USE 2 SPRAYS IN EACH NOSTRIL EVERY DAY 11/10/14  Yes Birdie Sons, MD  glucose blood (ACCU-CHEK AVIVA PLUS) test strip Use to check blood sugar once a day. E11.9 07/06/15  Yes Birdie Sons, MD  HYDROcodone-acetaminophen (NORCO/VICODIN) 5-325 MG tablet Take 1-2 tablets by mouth every 6 (six) hours as needed for moderate pain. 1-2 Tablets every 4-6 hours as needed 11/30/15  Yes Birdie Sons, MD  loratadine (CLARITIN) 10 MG tablet Take 1 tablet by mouth daily. 09/16/12  Yes Historical Provider, MD  losartan-hydrochlorothiazide (HYZAAR) 50-12.5 MG tablet TAKE 1 TABLET EVERY DAY 01/25/16  Yes Birdie Sons, MD  metFORMIN (GLUCOPHAGE-XR) 500 MG 24 hr tablet Take 1 tablet (500 mg total) by mouth daily with breakfast. 05/23/15  Yes Birdie Sons, MD  montelukast (SINGULAIR) 10 MG tablet TAKE 1 TABLET EVERY DAY 06/06/15  Yes Birdie Sons, MD  Omega 3-6-9 Fatty Acids (OMEGA 3-6-9 COMPLEX) CAPS Take 1 capsule by mouth daily.   Yes Historical Provider, MD  Probiotic Product (PROBIOTIC PO) Take 3 tablets by mouth daily.   Yes  Historical Provider, MD  ranitidine (ZANTAC) 150 MG capsule Take 1 capsule (150 mg total) by mouth 2 (two) times daily. 11/08/14  Yes Birdie Sons, MD  simvastatin (ZOCOR) 20 MG tablet Take 20 mg by mouth at bedtime.     Yes Historical Provider, MD  valACYclovir (VALTREX) 1000 MG tablet Take 1 tablet by mouth. Every 8 hours for seven days as needed 03/16/14  Yes Historical Provider, MD  VENTOLIN HFA 108 (90 BASE) MCG/ACT inhaler INHALE 2 PUFFS EVERY 6 HOURS AS NEEDED 02/20/15  Yes Birdie Sons, MD  vitamin C (ASCORBIC ACID) 250 MG tablet Take 4 tablets by mouth daily.   Yes Historical Provider, MD  benzonatate (TESSALON) 200 MG capsule Take 1 capsule (200 mg total) by mouth  3 (three) times daily as needed. 05/09/16   Frederich Cha, MD  cefUROXime (CEFTIN) 500 MG tablet Take 1 tablet (500 mg total) by mouth 2 (two) times daily. 05/09/16   Frederich Cha, MD  fexofenadine-pseudoephedrine (ALLEGRA-D ALLERGY & CONGESTION) 180-240 MG 24 hr tablet Take 1 tablet by mouth daily. 05/09/16   Frederich Cha, MD    Family History Family History  Problem Relation Age of Onset  . Heart attack Mother 25    MI  . Cancer Father     skin with mets to prostate and brain  . COPD Father   . Heart disease Sister     Palpitations  . Diabetes Sister   . Obesity Sister   . Cancer Maternal Aunt     Leukemia  . Schizophrenia Sister   . COPD Sister     Social History Social History  Substance Use Topics  . Smoking status: Current Every Day Smoker    Packs/day: 1.00    Types: Cigarettes    Last attempt to quit: 03/18/2009  . Smokeless tobacco: Never Used  . Alcohol use 0.0 oz/week     Comment: occasional use; 4 times a  year     Allergies   Levaquin  [levofloxacin in d5w]; Penicillins; and Sulfa antibiotics   Review of Systems Review of Systems  Unable to perform ROS: Other  Constitutional: Positive for fatigue and fever.  Respiratory: Positive for cough and shortness of breath.   Musculoskeletal:  Positive for myalgias.     Physical Exam Triage Vital Signs ED Triage Vitals  Enc Vitals Group     BP 05/09/16 1113 (!) 141/82     Pulse Rate 05/09/16 1113 76     Resp 05/09/16 1113 16     Temp 05/09/16 1113 99.3 F (37.4 C)     Temp Source 05/09/16 1113 Oral     SpO2 05/09/16 1113 100 %     Weight 05/09/16 1115 251 lb (113.9 kg)     Height 05/09/16 1115 5\' 7"  (1.702 m)     Head Circumference --      Peak Flow --      Pain Score --      Pain Loc --      Pain Edu? --      Excl. in [place]? --    No data found.   Updated Vital Signs BP (!) 141/82 (BP Location: Left Arm)   Pulse 76   Temp 99.3 F (37.4 C) (Oral)   Resp 16   Ht 5\' 7"  (1.702 m)   Wt 251 lb (113.9 kg)   SpO2 100%   BMI 39.31 kg/m   Visual Acuity Right Eye Distance:   Left Eye Distance:   Bilateral Distance:    Right Eye Near:   Left Eye Near:    Bilateral Near:     Physical Exam  Constitutional: She is oriented to person, place, and time. She appears well-developed and well-nourished. No distress.  HENT:  Head: Normocephalic and atraumatic.  Right Ear: External ear normal.  Left Ear: External ear normal.  Eyes: Pupils are equal, round, and reactive to light.  Neck: Normal range of motion. Neck supple.  Cardiovascular: Normal rate and regular rhythm.   Pulmonary/Chest: She has wheezes.  Musculoskeletal: Normal range of motion. She exhibits no edema or deformity.  Neurological: She is alert and oriented to person, place, and time.  Skin: Skin is warm. She is not diaphoretic.  Psychiatric: She has a normal mood and  affect.  Vitals reviewed.    UC Treatments / Results  Labs (all labs ordered are listed, but only abnormal results are displayed) Labs Reviewed - No data to display  EKG  EKG Interpretation None       Radiology Dg Chest 2 View  Result Date: 05/09/2016 CLINICAL DATA:  61 year old female with productive cough for 4-5 weeks, shortness of breath, fever. Right lateral chest  tightness. Initial encounter. Smoker. EXAM: CHEST  2 VIEW COMPARISON:  09/12/2015 and earlier. FINDINGS: Stable cardiac size at the upper limits of normal to mildly increased. Other mediastinal contours remain normal. Visualized tracheal air column is within normal limits. Mild chronic increased pulmonary interstitial markings. No pneumothorax, pulmonary edema, pleural effusion or confluent pulmonary opacity. No acute osseous abnormality identified. Negative visible bowel gas pattern. IMPRESSION: No acute cardiopulmonary abnormality. Electronically Signed   By: Genevie Ann M.D.   On: 05/09/2016 12:10    Procedures Procedures (including critical care time)  Medications Ordered in UC Medications - No data to display   Initial Impression / Assessment and Plan / UC Course  I have reviewed the triage vital signs and the nursing notes.  Pertinent labs & imaging results that were available during my care of the patient were reviewed by me and considered in my medical decision making (see chart for details).     We'll get a chest x-ray patient since she's been coughing off and on for at least 4 weeks.  X-ray was negative. We'll place on Ceftin 500 twice a day and Tessalon Perles one 3 times a day for cough.  Final Clinical Impressions(s) / UC Diagnoses   Final diagnoses:  Upper respiratory tract infection, unspecified type    New Prescriptions New Prescriptions   BENZONATATE (TESSALON) 200 MG CAPSULE    Take 1 capsule (200 mg total) by mouth 3 (three) times daily as needed.   CEFUROXIME (CEFTIN) 500 MG TABLET    Take 1 tablet (500 mg total) by mouth 2 (two) times daily.   FEXOFENADINE-PSEUDOEPHEDRINE (ALLEGRA-D ALLERGY & CONGESTION) 180-240 MG 24 HR TABLET    Take 1 tablet by mouth daily.    Note: This dictation was prepared with Dragon dictation along with smaller phrase technology. Any transcriptional errors that result from this process are unintentional.   Frederich Cha, MD 05/09/16  1239

## 2016-05-14 ENCOUNTER — Ambulatory Visit: Payer: Medicare HMO | Admitting: Family Medicine

## 2016-05-15 ENCOUNTER — Ambulatory Visit (INDEPENDENT_AMBULATORY_CARE_PROVIDER_SITE_OTHER): Payer: Medicare HMO | Admitting: Family Medicine

## 2016-05-15 ENCOUNTER — Encounter: Payer: Self-pay | Admitting: Family Medicine

## 2016-05-15 VITALS — BP 132/80 | HR 59 | Temp 98.1°F | Resp 16 | Wt 256.0 lb

## 2016-05-15 DIAGNOSIS — E119 Type 2 diabetes mellitus without complications: Secondary | ICD-10-CM | POA: Diagnosis not present

## 2016-05-15 DIAGNOSIS — R42 Dizziness and giddiness: Secondary | ICD-10-CM | POA: Diagnosis not present

## 2016-05-15 DIAGNOSIS — M5137 Other intervertebral disc degeneration, lumbosacral region: Secondary | ICD-10-CM | POA: Diagnosis not present

## 2016-05-15 DIAGNOSIS — J399 Disease of upper respiratory tract, unspecified: Secondary | ICD-10-CM

## 2016-05-15 DIAGNOSIS — J42 Unspecified chronic bronchitis: Secondary | ICD-10-CM

## 2016-05-15 DIAGNOSIS — I10 Essential (primary) hypertension: Secondary | ICD-10-CM | POA: Diagnosis not present

## 2016-05-15 LAB — POCT GLYCOSYLATED HEMOGLOBIN (HGB A1C)
ESTIMATED AVERAGE GLUCOSE: 169
HEMOGLOBIN A1C: 7.5

## 2016-05-15 MED ORDER — ALBUTEROL SULFATE HFA 108 (90 BASE) MCG/ACT IN AERS: 2.0000 | INHALATION_SPRAY | Freq: Four times a day (QID) | RESPIRATORY_TRACT | 1 refills | Status: AC | PRN

## 2016-05-15 MED ORDER — HYDROCODONE-ACETAMINOPHEN 5-325 MG PO TABS: 1.0000 | ORAL_TABLET | Freq: Four times a day (QID) | ORAL | 0 refills | Status: DC | PRN

## 2016-05-15 MED ORDER — PREDNISONE 10 MG PO TABS: ORAL_TABLET | ORAL | 0 refills | Status: AC

## 2016-05-15 MED ORDER — BUDESONIDE-FORMOTEROL FUMARATE 160-4.5 MCG/ACT IN AERO: 2.0000 | INHALATION_SPRAY | Freq: Two times a day (BID) | RESPIRATORY_TRACT | 12 refills | Status: DC

## 2016-05-15 NOTE — Progress Notes (Signed)
Patient: Misty Bishop [facility] Female    DOB: [birth year]   61 y.o.   MRN: [id number] Visit Date: 05/15/2016  Today's Provider: Lelon Huh, MD   Chief Complaint  Patient presents with  . Diabetes    follow up  . Hypertension    follow up  . Pain    follow up  . URI   Subjective:    HPI  Diabetes Mellitus Type II, Follow-up:   Lab Results  Component Value Date   HGBA1C 6.8 04/18/2015   HGBA1C 6.9 (A) 06/09/2014   HGBA1C 5.8 09/02/2011    Last seen for diabetes 5 months ago.  Management since then includes no changes. She reports good compliance with treatment. She is not having side effects.  Current symptoms include paresthesia of the feet and visual disturbances and have been stable. Home blood sugar records: fasting range: 260  Episodes of hypoglycemia? no   Current Insulin Regimen: none Most Recent Eye Exam: > 1 year ago Weight trend: stable Prior visit with dietician: no Current diet: in general, an "unhealthy" diet Current exercise: none  Pertinent Labs:    Component Value Date/Time   CHOL 185 06/09/2014   CHOL 183 09/02/2011 0208   TRIG 139 06/09/2014   TRIG 214 (H) 09/02/2011 0208   HDL 37 06/09/2014   HDL 55 09/02/2011 0208   LDLCALC 120 06/09/2014   LDLCALC 85 09/02/2011 0208   CREATININE 0.89 09/12/2015 1623   CREATININE 1.01 09/02/2011 0208    Wt Readings from Last 3 Encounters:  05/09/16 251 lb (113.9 kg)  11/30/15 245 lb (111.1 kg)  09/12/15 253 lb (114.8 kg)    ------------------------------------------------------------------------  Hypertension, follow-up:  BP Readings from Last 3 Encounters:  05/09/16 (!) 141/82  11/30/15 110/60  09/12/15 138/78    She was last seen for hypertension 5 months ago.  BP at that visit was 110/60. Management since that visit includes no changes. She reports good compliance with treatment. She is not having side effects.  She is not exercising. She is not adherent to low salt  diet.   Outside blood pressures are not being checked. She is experiencing chest pain, dyspnea, fatigue and lower extremity edema.  Patient denies near-syncope, orthopnea, palpitations, paroxysmal nocturnal dyspnea, syncope and tachypnea.   Cardiovascular risk factors include diabetes mellitus, hypertension and sedentary lifestyle.  Use of agents associated with hypertension: none.     Weight trend: stable Wt Readings from Last 3 Encounters:  05/09/16 251 lb (113.9 kg)  11/30/15 245 lb (111.1 kg)  09/12/15 253 lb (114.8 kg)    Current diet: in general, an "unhealthy" diet  ------------------------------------------------------------------------  Follow up of chronic Pain:  Patient was last seen for this problem 5 months ago and no changes were made. Patient reports good compliance with treatment, good tolerance and poor symptom control.   URI:  Patient was seen at [facility] on 05/09/2016 for URI and was treated with Ceftin 500mg  twice daily and Tessalon Perles for cough. Patient comes in today reporting symptoms have worsened. Patient has been taking Ceftin as prescribed, but was unable to get Tessalon Perles due to cost. Patient has been taking Sudafed and Allegra D. Since UC visit has been having right ear pain.     Allergies  Allergen Reactions  . Levaquin  [Levofloxacin In D5w]     lip swelling, SOB.  Marland Kitchen Penicillins     Pt tolerates Cefdinir  . Sulfa Antibiotics Other (See Comments) and  Itching     Current Outpatient Prescriptions:  .  amLODipine (NORVASC) 10 MG tablet, Take 0.5 tablets by mouth daily., Disp: , Rfl:  .  aspirin 81 MG EC tablet, Take 81 mg by mouth daily.  , Disp: , Rfl:  .  b complex vitamins capsule, Take 1 capsule by mouth Once PRN.  , Disp: , Rfl:  .  budesonide-formoterol (SYMBICORT) 160-4.5 MCG/ACT inhaler, Inhale 2 puffs into the lungs 2 (two) times daily., Disp: 1 Inhaler, Rfl: 12 .  cefUROXime (CEFTIN) 500 MG tablet, Take 1 tablet (500 mg  total) by mouth 2 (two) times daily., Disp: 20 tablet, Rfl: 0 .  clonazePAM (KLONOPIN) 1 MG tablet, Take by mouth. Take 1-2 tablets daily and 2 tablets at bedtime. , Disp: , Rfl:  .  dexlansoprazole (DEXILANT) 60 MG capsule, Take 1 capsule (60 mg total) by mouth daily., Disp: 90 capsule, Rfl: 3 .  diclofenac (VOLTAREN) 75 MG EC tablet, Take 1 tablet by mouth 2 (two) times daily as needed., Disp: , Rfl:  .  diclofenac sodium (VOLTAREN) 1 % GEL, APPLY TO AFFECTED AREA FOUR TIMES A DAY AS NEEDED, Disp: 300 g, Rfl: 1 .  escitalopram (LEXAPRO) 20 MG tablet, Take 1 tablet (20 mg total) by mouth daily. Take 1/2 tablet daily for six days, then increase to one full tablet daily, Disp: 90 tablet, Rfl: 0 .  fexofenadine-pseudoephedrine (ALLEGRA-D ALLERGY & CONGESTION) 180-240 MG 24 hr tablet, Take 1 tablet by mouth daily., Disp: 30 tablet, Rfl: 0 .  fluticasone (FLONASE) 50 MCG/ACT nasal spray, USE 2 SPRAYS IN EACH NOSTRIL EVERY DAY, Disp: 48 g, Rfl: 5 .  glucose blood (ACCU-CHEK AVIVA PLUS) test strip, Use to check blood sugar once a day. E11.9, Disp: 100 each, Rfl: 4 .  HYDROcodone-acetaminophen (NORCO/VICODIN) 5-325 MG tablet, Take 1-2 tablets by mouth every 6 (six) hours as needed for moderate pain. 1-2 Tablets every 4-6 hours as needed, Disp: 135 tablet, Rfl: 0 .  loratadine (CLARITIN) 10 MG tablet, Take 1 tablet by mouth daily., Disp: , Rfl:  .  losartan-hydrochlorothiazide (HYZAAR) 50-12.5 MG tablet, TAKE 1 TABLET EVERY DAY, Disp: 90 tablet, Rfl: 4 .  metFORMIN (GLUCOPHAGE-XR) 500 MG 24 hr tablet, Take 1 tablet (500 mg total) by mouth daily with breakfast., Disp: 90 tablet, Rfl: 4 .  montelukast (SINGULAIR) 10 MG tablet, TAKE 1 TABLET EVERY DAY, Disp: 90 tablet, Rfl: 3 .  Omega 3-6-9 Fatty Acids (OMEGA 3-6-9 COMPLEX) CAPS, Take 1 capsule by mouth daily., Disp: , Rfl:  .  Probiotic Product (PROBIOTIC PO), Take 3 tablets by mouth daily., Disp: , Rfl:  .  ranitidine (ZANTAC) 150 MG capsule, Take 1 capsule  (150 mg total) by mouth 2 (two) times daily., Disp: 180 capsule, Rfl: 3 .  simvastatin (ZOCOR) 20 MG tablet, Take 20 mg by mouth at bedtime.  , Disp: , Rfl:  .  valACYclovir (VALTREX) 1000 MG tablet, Take 1 tablet by mouth. Every 8 hours for seven days as needed, Disp: , Rfl:  .  VENTOLIN HFA 108 (90 BASE) MCG/ACT inhaler, INHALE 2 PUFFS EVERY 6 HOURS AS NEEDED, Disp: 54 g, Rfl: 1 .  vitamin C (ASCORBIC ACID) 250 MG tablet, Take 4 tablets by mouth daily., Disp: , Rfl:   Review of Systems  Constitutional: Positive for chills, diaphoresis, fatigue and fever. Negative for appetite change.  HENT: Positive for congestion, ear pain (right ear), rhinorrhea, sneezing and sore throat.   Eyes: Negative.  Negative for pain and redness.  Respiratory: Positive for cough, shortness of breath and wheezing. Negative for chest tightness.   Cardiovascular: Negative for chest pain and leg swelling.  Gastrointestinal: Negative for abdominal pain, blood in stool, constipation, diarrhea and nausea.  Endocrine: Negative for polydipsia and polyphagia.  Genitourinary: Negative.  Negative for dysuria, flank pain, hematuria, pelvic pain, vaginal bleeding and vaginal discharge.  Musculoskeletal: Positive for back pain. Negative for arthralgias, gait problem and joint swelling.  Skin: Negative for rash.  Neurological: Positive for light-headedness and headaches. Negative for dizziness, tremors, seizures, weakness and numbness.  Hematological: Negative for adenopathy.  Psychiatric/Behavioral: Negative.  Negative for behavioral problems, confusion and dysphoric mood. The patient is not nervous/anxious and is not hyperactive.     Social History  Substance Use Topics  . Smoking status: Current Every Day Smoker    Packs/day: 1.00    Types: Cigarettes    Last attempt to quit: 03/18/2009  . Smokeless tobacco: Never Used  . Alcohol use 0.0 oz/week     Comment: occasional use; 4 times a  year   Objective:   BP 132/80 (BP  Location: Left Arm, Patient Position: Sitting, Cuff Size: Large)   Pulse (!) 59   Temp 98.1 F (36.7 C) (Oral)   Resp 16   Wt 256 lb (116.1 kg)   SpO2 94% Comment: room air  BMI 40.10 kg/m     Physical Exam  General Appearance:    Alert, cooperative, no distress  HENT:   bilateral TM normal without fluid or infection, neck without nodes and nasal mucosa congested  Eyes:    PERRL, conjunctiva/corneas clear, EOM's intact       Lungs:     Clear to auscultation bilaterally, respirations unlabored  Heart:    Regular rate and rhythm  Neurologic:   Awake, alert, oriented x 3. No apparent focal neurological           defect.       Dg Chest 2 View  Result Date: 05/09/2016 CLINICAL DATA:  61 year old female with productive cough for 4-5 weeks, shortness of breath, fever. Right lateral chest tightness. Initial encounter. Smoker. EXAM: CHEST  2 VIEW COMPARISON:  09/12/2015 and earlier. FINDINGS: Stable cardiac size at the upper limits of normal to mildly increased. Other mediastinal contours remain normal. Visualized tracheal air column is within normal limits. Mild chronic increased pulmonary interstitial markings. No pneumothorax, pulmonary edema, pleural effusion or confluent pulmonary opacity. No acute osseous abnormality identified. Negative visible bowel gas pattern. IMPRESSION: No acute cardiopulmonary abnormality. Electronically Signed   By: Genevie Ann M.D.   On: 05/09/2016 12:10   Results for orders placed or performed in visit on 05/15/16  POCT HgB A1C  Result Value Ref Range   Hemoglobin A1C 7.5    Est. average glucose Bld gHb Est-mCnc 169        Assessment & Plan:     1. Type 2 diabetes mellitus without complication, without long-term current use of insulin (HCC) Stable Continue current medications.   - POCT HgB A1C - Renal function panel - Lipid panel  2. Upper respiratory disease Continue on current antibiotic.   3. Chronic bronchitis, unspecified chronic bronchitis type  (HCC)  - budesonide-formoterol (SYMBICORT) 160-4.5 MCG/ACT inhaler; Inhale 2 puffs into the lungs 2 (two) times daily.  Dispense: 1 Inhaler; Refill: 12  4. Degeneration of lumbar or lumbosacral intervertebral disc  - HYDROcodone-acetaminophen (NORCO/VICODIN) 5-325 MG tablet; Take 1-2 tablets by mouth every 6 (six) hours as needed for moderate pain. 1-2 Tablets  every 4-6 hours as needed  Dispense: 135 tablet; Refill: 0  5. Benign essential HTN  - Renal function panel  6. Dizziness  - Comprehensive metabolic panel - CBC       Lelon Huh, MD  [facility]

## 2016-05-17 LAB — HM DIABETES EYE EXAM

## 2016-05-21 ENCOUNTER — Encounter: Payer: Self-pay | Admitting: Family Medicine

## 2016-06-27 ENCOUNTER — Other Ambulatory Visit: Payer: Self-pay | Admitting: Family Medicine

## 2016-06-27 DIAGNOSIS — E119 Type 2 diabetes mellitus without complications: Secondary | ICD-10-CM

## 2016-07-25 ENCOUNTER — Other Ambulatory Visit: Payer: Self-pay | Admitting: Family Medicine

## 2016-07-25 DIAGNOSIS — M5137 Other intervertebral disc degeneration, lumbosacral region: Secondary | ICD-10-CM

## 2016-07-25 DIAGNOSIS — M51379 Other intervertebral disc degeneration, lumbosacral region without mention of lumbar back pain or lower extremity pain: Secondary | ICD-10-CM

## 2016-07-25 DIAGNOSIS — E119 Type 2 diabetes mellitus without complications: Secondary | ICD-10-CM

## 2016-07-25 DIAGNOSIS — J42 Unspecified chronic bronchitis: Secondary | ICD-10-CM

## 2016-07-25 MED ORDER — HYDROCODONE-ACETAMINOPHEN 5-325 MG PO TABS: 1.0000 | ORAL_TABLET | Freq: Four times a day (QID) | ORAL | 0 refills | Status: DC | PRN

## 2016-07-25 MED ORDER — DICLOFENAC SODIUM 1 % TD GEL: TRANSDERMAL | 4 refills | Status: DC

## 2016-07-25 MED ORDER — METFORMIN HCL ER 500 MG PO TB24: 500.0000 mg | ORAL_TABLET | Freq: Every day | ORAL | 4 refills | Status: DC

## 2016-07-25 MED ORDER — MONTELUKAST SODIUM 10 MG PO TABS: 10.0000 mg | ORAL_TABLET | Freq: Every day | ORAL | 4 refills | Status: DC

## 2016-07-25 MED ORDER — FLUTICASONE PROPIONATE 50 MCG/ACT NA SUSP: 2.0000 | Freq: Every day | NASAL | 4 refills | Status: DC

## 2016-07-25 MED ORDER — LOSARTAN POTASSIUM-HCTZ 50-12.5 MG PO TABS: 1.0000 | ORAL_TABLET | Freq: Every day | ORAL | 4 refills | Status: DC

## 2016-07-25 MED ORDER — BUDESONIDE-FORMOTEROL FUMARATE 160-4.5 MCG/ACT IN AERO: 2.0000 | INHALATION_SPRAY | Freq: Two times a day (BID) | RESPIRATORY_TRACT | 4 refills | Status: DC

## 2016-07-25 NOTE — Telephone Encounter (Signed)
See message below °

## 2016-07-25 NOTE — Telephone Encounter (Signed)
Pt contacted office for refill request on the following medications:  [facility] mail order.  FX#[phone]  diclofenac sodium (VOLTAREN) 1 % GEL  montelukast (SINGULAIR) 10 MG tablet  losartan-hydrochlorothiazide (HYZAAR) 50-12.5 MG tablet  metFORMIN (GLUCOPHAGE-XR) 500 MG 24 hr tablet  fluticasone (FLONASE) 50 MCG/ACT nasal spray  Symbicort inhaler (pt has been getting samples)  HYDROcodone-acetaminophen (NORCO/VICODIN) 5-325 MG tablet  Pt is requesting a copy of the last blood work order (not results).  Pt states she forgot to have her lab work done the last time she was in/[facility]

## 2016-08-26 DIAGNOSIS — R0789 Other chest pain: Secondary | ICD-10-CM | POA: Diagnosis not present

## 2016-08-26 DIAGNOSIS — R9431 Abnormal electrocardiogram [ECG] [EKG]: Secondary | ICD-10-CM | POA: Diagnosis not present

## 2016-08-26 DIAGNOSIS — K148 Other diseases of tongue: Secondary | ICD-10-CM | POA: Diagnosis not present

## 2016-08-26 DIAGNOSIS — R079 Chest pain, unspecified: Secondary | ICD-10-CM | POA: Diagnosis not present

## 2016-08-26 DIAGNOSIS — T7840XA Allergy, unspecified, initial encounter: Secondary | ICD-10-CM | POA: Diagnosis not present

## 2016-08-26 DIAGNOSIS — R0602 Shortness of breath: Secondary | ICD-10-CM | POA: Diagnosis not present

## 2016-08-26 DIAGNOSIS — J441 Chronic obstructive pulmonary disease with (acute) exacerbation: Secondary | ICD-10-CM | POA: Diagnosis not present

## 2016-08-27 DIAGNOSIS — R0602 Shortness of breath: Secondary | ICD-10-CM | POA: Diagnosis not present

## 2016-08-27 DIAGNOSIS — R9431 Abnormal electrocardiogram [ECG] [EKG]: Secondary | ICD-10-CM | POA: Diagnosis not present

## 2016-08-28 ENCOUNTER — Telehealth: Payer: Self-pay | Admitting: Family Medicine

## 2016-08-28 NOTE — Telephone Encounter (Signed)
Pt is requesting a new lab slip to have labs done from March 2018.  SH#[phone]/[id number]

## 2016-09-04 ENCOUNTER — Ambulatory Visit (INDEPENDENT_AMBULATORY_CARE_PROVIDER_SITE_OTHER): Payer: Medicare HMO | Admitting: Family Medicine

## 2016-09-04 ENCOUNTER — Encounter: Payer: Self-pay | Admitting: Family Medicine

## 2016-09-04 VITALS — BP 128/80 | HR 66 | Temp 98.1°F | Resp 18 | Wt 245.0 lb

## 2016-09-04 DIAGNOSIS — F17200 Nicotine dependence, unspecified, uncomplicated: Secondary | ICD-10-CM

## 2016-09-04 DIAGNOSIS — E119 Type 2 diabetes mellitus without complications: Secondary | ICD-10-CM | POA: Diagnosis not present

## 2016-09-04 DIAGNOSIS — F32A Depression, unspecified: Secondary | ICD-10-CM

## 2016-09-04 DIAGNOSIS — K219 Gastro-esophageal reflux disease without esophagitis: Secondary | ICD-10-CM | POA: Diagnosis not present

## 2016-09-04 DIAGNOSIS — I10 Essential (primary) hypertension: Secondary | ICD-10-CM | POA: Diagnosis not present

## 2016-09-04 DIAGNOSIS — R5382 Chronic fatigue, unspecified: Secondary | ICD-10-CM | POA: Diagnosis not present

## 2016-09-04 DIAGNOSIS — G9332 Myalgic encephalomyelitis/chronic fatigue syndrome: Secondary | ICD-10-CM

## 2016-09-04 DIAGNOSIS — F3177 Bipolar disorder, in partial remission, most recent episode mixed: Secondary | ICD-10-CM

## 2016-09-04 DIAGNOSIS — T782XXA Anaphylactic shock, unspecified, initial encounter: Secondary | ICD-10-CM

## 2016-09-04 DIAGNOSIS — F329 Major depressive disorder, single episode, unspecified: Secondary | ICD-10-CM

## 2016-09-04 DIAGNOSIS — F419 Anxiety disorder, unspecified: Secondary | ICD-10-CM | POA: Diagnosis not present

## 2016-09-04 LAB — POCT GLYCOSYLATED HEMOGLOBIN (HGB A1C)
Est. average glucose Bld gHb Est-mCnc: 206
Hemoglobin A1C: 8.8

## 2016-09-04 MED ORDER — ESCITALOPRAM OXALATE 20 MG PO TABS: 20.0000 mg | ORAL_TABLET | Freq: Every day | ORAL | 4 refills | Status: DC

## 2016-09-04 MED ORDER — METFORMIN HCL ER 500 MG PO TB24: 1000.0000 mg | ORAL_TABLET | Freq: Every day | ORAL | 4 refills | Status: DC

## 2016-09-04 MED ORDER — SIMVASTATIN 20 MG PO TABS: 20.0000 mg | ORAL_TABLET | Freq: Every day | ORAL | 4 refills | Status: DC

## 2016-09-04 NOTE — Progress Notes (Signed)
Patient: Misty Bishop [facility] Female    DOB: [birth year]   61 y.o.   MRN: [id number] Visit Date: 09/04/2016  Today's Provider: Lelon Huh, MD   Chief Complaint  Patient presents with  . Hypertension    follow up  . Diabetes    follow up  . Chronic fatigue    follow up  . Back Pain    follow up   Subjective:    HPI  Hypertension, follow-up:  BP Readings from Last 3 Encounters:  05/15/16 132/80  05/09/16 (!) 141/82  11/30/15 110/60    She was last seen for hypertension 4 months ago.  BP at that visit was 132/80. Management since that visit includes no changes. She reports good compliance with treatment. She is not having side effects.  She is not exercising. She is adherent to low salt diet.   Outside blood pressures are 136/80. She is experiencing chest pain, dyspnea, fatigue, lower extremity edema and palpitations.  Patient denies irregular heart beat.   Cardiovascular risk factors include diabetes mellitus, hypertension and smoking/ tobacco exposure.  Use of agents associated with hypertension: NSAIDS.     Weight trend: decreasing steadily Wt Readings from Last 3 Encounters:  05/15/16 256 lb (116.1 kg)  05/09/16 251 lb (113.9 kg)  11/30/15 245 lb (111.1 kg)    Current diet: in general, an "unhealthy" diet  ------------------------------------------------------------------------  Diabetes Mellitus Type II, Follow-up:   Lab Results  Component Value Date   HGBA1C 7.5 05/15/2016   HGBA1C 6.8 04/18/2015   HGBA1C 6.9 (A) 06/09/2014    Last seen for diabetes 4 months ago.  Management since then includes no changes. She reports good compliance with treatment. She is not having side effects.  Current symptoms include hyperglycemia and have been stable. Home blood sugar records: trend: increasing steadily yesterday -307  Episodes of hypoglycemia? no   Current Insulin Regimen: none Most Recent Eye Exam: <1 year ago Weight trend: decreasing  steadily Prior visit with dietician: no Current diet: in general, an "unhealthy" diet Current exercise: none  Pertinent Labs:    Component Value Date/Time   CHOL 185 06/09/2014   CHOL 183 09/02/2011 0208   TRIG 139 06/09/2014   TRIG 214 (H) 09/02/2011 0208   HDL 37 06/09/2014   HDL 55 09/02/2011 0208   LDLCALC 120 06/09/2014   LDLCALC 85 09/02/2011 0208   CREATININE 0.89 09/12/2015 1623   CREATININE 1.01 09/02/2011 0208    Wt Readings from Last 3 Encounters:  05/15/16 256 lb (116.1 kg)  05/09/16 251 lb (113.9 kg)  11/30/15 245 lb (111.1 kg)    ------------------------------------------------------------------------ Follow up of Degeneration of Lumbar or Lumbosacral Intervertebral disc:  Patient was last seen for this problem 4 months ago and no changes were made. Patient reports good compliance with treatment and reports this problem is stable.   Follow up chronic fatigue/anxiety She continues to have relative frequent episodes of extreme fatigue impairing ability to think or focus. She works at [employer]iation [employer] for [employer] and has several days a month she is unable to perform her essential job functions due to inability to focus and concentrate. Her employer requires forms completed for accommodations to her schedule to allow her to call in sick when she is unable to perform her job duties.   She has had two visits to [facility] ER in last month. One was episode of throat swelling while eating hot dog associated with chest pain. She was given solumedrol,  Pepcid, epinephrine and a breathing treatment with resolutions of symptoms. She was discharged with prescription for doxycyline, prednisone, epipen, and referral to allergist.   Was also seen on 08/22/2016 for bronchitis/sinusitis    Allergies  Allergen Reactions  . Levaquin  [Levofloxacin In D5w]     lip swelling, SOB.  Marland Kitchen Penicillins     Pt tolerates Cefdinir  . Sulfa Antibiotics Other (See Comments) and Itching      Current Outpatient Prescriptions:  .  albuterol (VENTOLIN HFA) 108 (90 Base) MCG/ACT inhaler, Inhale 2 puffs into the lungs every 6 (six) hours as needed., Disp: 54 g, Rfl: 1 .  aspirin 81 MG EC tablet, Take 81 mg by mouth daily.  , Disp: , Rfl:  .  b complex vitamins capsule, Take 1 capsule by mouth Once PRN.  , Disp: , Rfl:  .  budesonide-formoterol (SYMBICORT) 160-4.5 MCG/ACT inhaler, Inhale 2 puffs into the lungs 2 (two) times daily., Disp: 3 Inhaler, Rfl: 4 .  cefUROXime (CEFTIN) 500 MG tablet, Take 1 tablet (500 mg total) by mouth 2 (two) times daily., Disp: 20 tablet, Rfl: 0 .  dexlansoprazole (DEXILANT) 60 MG capsule, Take 1 capsule (60 mg total) by mouth daily., Disp: 90 capsule, Rfl: 3 .  diclofenac (VOLTAREN) 75 MG EC tablet, Take 1 tablet by mouth 2 (two) times daily as needed., Disp: , Rfl:  .  diclofenac sodium (VOLTAREN) 1 % GEL, APPLY TO AFFECTED AREA FOUR TIMES A DAY AS NEEDED, Disp: 300 g, Rfl: 4 .  fluticasone (FLONASE) 50 MCG/ACT nasal spray, Place 2 sprays into both nostrils daily., Disp: 48 g, Rfl: 4 .  glucose blood (ACCU-CHEK AVIVA PLUS) test strip, Use to check blood sugar once a day. E11.9, Disp: 100 each, Rfl: 4 .  HYDROcodone-acetaminophen (NORCO/VICODIN) 5-325 MG tablet, Take 1-2 tablets by mouth every 6 (six) hours as needed for moderate pain. 1-2 Tablets every 4-6 hours as needed, Disp: 135 tablet, Rfl: 0 .  loratadine (CLARITIN) 10 MG tablet, Take 1 tablet by mouth daily., Disp: , Rfl:  .  losartan-hydrochlorothiazide (HYZAAR) 50-12.5 MG tablet, Take 1 tablet by mouth daily., Disp: 90 tablet, Rfl: 4 .  metFORMIN (GLUCOPHAGE-XR) 500 MG 24 hr tablet, Take 1 tablet (500 mg total) by mouth daily with breakfast., Disp: 90 tablet, Rfl: 4 .  montelukast (SINGULAIR) 10 MG tablet, Take 1 tablet (10 mg total) by mouth daily., Disp: 90 tablet, Rfl: 4 .  Omega 3-6-9 Fatty Acids (OMEGA 3-6-9 COMPLEX) CAPS, Take 1 capsule by mouth daily., Disp: , Rfl:  .  Probiotic Product  (PROBIOTIC PO), Take 3 tablets by mouth daily., Disp: , Rfl:  .  ranitidine (ZANTAC) 150 MG capsule, Take 1 capsule (150 mg total) by mouth 2 (two) times daily., Disp: 180 capsule, Rfl: 3 .  valACYclovir (VALTREX) 1000 MG tablet, Take 1 tablet by mouth. Every 8 hours for seven days as needed, Disp: , Rfl:  .  vitamin C (ASCORBIC ACID) 250 MG tablet, Take 4 tablets by mouth daily., Disp: , Rfl:  .  clonazePAM (KLONOPIN) 1 MG tablet, Take by mouth. Take 1-2 tablets daily and 2 tablets at bedtime. , Disp: , Rfl:  .  escitalopram (LEXAPRO) 20 MG tablet, Take 1 tablet (20 mg total) by mouth daily. Take 1/2 tablet daily for six days, then increase to one full tablet daily (Patient not taking: Reported on 09/04/2016), Disp: 90 tablet, Rfl: 0 .  simvastatin (ZOCOR) 20 MG tablet, Take 20 mg by mouth at bedtime.  ,  Disp: , Rfl:   Review of Systems  Constitutional: Negative for appetite change, chills, fatigue and fever.  Respiratory: Positive for shortness of breath. Negative for chest tightness.   Cardiovascular: Positive for chest pain, palpitations and leg swelling.  Gastrointestinal: Negative for abdominal pain, nausea and vomiting.  Musculoskeletal: Positive for arthralgias and back pain.  Neurological: Positive for dizziness and light-headedness. Negative for weakness.  Psychiatric/Behavioral: Positive for confusion.    Social History  Substance Use Topics  . Smoking status: Current Every Day Smoker    Packs/day: 1.50    Types: Cigarettes    Last attempt to quit: 03/18/2009  . Smokeless tobacco: Never Used  . Alcohol use 0.0 oz/week     Comment: occasional use   Objective:   BP 128/80 (BP Location: Left Arm, Patient Position: Sitting, Cuff Size: Large)   Pulse 66   Temp 98.1 F (36.7 C) (Oral)   Resp 18   Wt 245 lb (111.1 kg)   SpO2 99% Comment: room air  BMI 38.37 kg/m  There were no vitals filed for this visit.   Physical Exam   General Appearance:    Alert, cooperative, no  distress, obese  Eyes:    PERRL, conjunctiva/corneas clear, EOM's intact       Lungs:     Clear to auscultation bilaterally, respirations unlabored  Heart:    Regular rate and rhythm  Neurologic:   Awake, alert, oriented x 3. No apparent focal neurological           defect.         Results for orders placed or performed in visit on 09/04/16  POCT HgB A1C  Result Value Ref Range   Hemoglobin A1C 8.8    Est. average glucose Bld gHb Est-mCnc 206        Assessment & Plan:     1. Type 2 diabetes mellitus without complication, without long-term current use of insulin (HCC) Uncontrolled increase metformin. Follow up 3 months.  - POCT HgB A1C - metFORMIN (GLUCOPHAGE-XR) 500 MG 24 hr tablet; Take 2 tablets (1,000 mg total) by mouth daily with breakfast.  Dispense: 180 tablet; Refill: 4 - simvastatin (ZOCOR) 20 MG tablet; Take 1 tablet (20 mg total) by mouth at bedtime.  Dispense: 90 tablet; Refill: 4  2. Gastroesophageal reflux disease, esophagitis presence not specified Continue current PPI  3. Benign essential HTN Stable. Continue current medications.    4. Depression, unspecified depression type Needs to get back on SSRI - escitalopram (LEXAPRO) 20 MG tablet; Take 1 tablet (20 mg total) by mouth daily. Take 1/2 tablet daily for six days, then increase to one full tablet daily  Dispense: 90 tablet; Refill: 4  5. Compulsive tobacco user syndrome Counseled to stop smoking   6. Anxiety  - escitalopram (LEXAPRO) 20 MG tablet; Take 1 tablet (20 mg total) by mouth daily. Take 1/2 tablet daily for six days, then increase to one full tablet daily  Dispense: 90 tablet; Refill: 4  7. Anaphylaxis, initial encounter Unclear cause of throat swelling which required solumedrol and epinephrine injection from ED. She already has prescription for epinephrine pen.  - Ambulatory referral to Allergy  8. Chronic fatigue syndrome Reviewed and completed forms for accomodation to work schedule due to  chronic medication conditions.   9. Bipolar disorder, in partial remission, most recent episode mixed [facility])    Addressed extensive list of chronic and acute medical problems today requiring extensive time in counseling and coordination care.  Over half  of this 45 minute visit were spent in counseling and coordinating care of multiple medical problems.       Lelon Huh, MD  [facility]

## 2016-09-04 NOTE — Patient Instructions (Signed)
Start taking vitamin B12 1,000mg  once a day.

## 2016-09-13 ENCOUNTER — Ambulatory Visit: Payer: Medicare HMO | Admitting: Family Medicine

## 2016-09-19 ENCOUNTER — Encounter: Payer: Self-pay | Admitting: Family Medicine

## 2016-09-23 ENCOUNTER — Other Ambulatory Visit: Payer: Self-pay | Admitting: Family Medicine

## 2016-09-23 DIAGNOSIS — E119 Type 2 diabetes mellitus without complications: Secondary | ICD-10-CM

## 2016-09-23 MED ORDER — METFORMIN HCL ER 500 MG PO TB24: 1000.0000 mg | ORAL_TABLET | Freq: Every day | ORAL | 4 refills | Status: DC

## 2016-10-16 ENCOUNTER — Ambulatory Visit: Payer: Medicare HMO | Admitting: Family Medicine

## 2016-10-16 ENCOUNTER — Ambulatory Visit: Payer: Medicare HMO

## 2016-11-08 ENCOUNTER — Encounter: Payer: Self-pay | Admitting: *Deleted

## 2016-12-02 ENCOUNTER — Other Ambulatory Visit: Payer: Self-pay

## 2016-12-02 DIAGNOSIS — Z1211 Encounter for screening for malignant neoplasm of colon: Secondary | ICD-10-CM

## 2016-12-05 ENCOUNTER — Encounter: Payer: Medicare HMO | Admitting: Family Medicine

## 2016-12-05 ENCOUNTER — Ambulatory Visit: Payer: Medicare HMO

## 2016-12-05 NOTE — Progress Notes (Deleted)
Patient: Misty Bishop, Female    DOB: 1956/01/22, 61 y.o.   MRN: [id number] Visit Date: 12/05/2016  Today's Provider: Lelon Huh, MD   No chief complaint on file.  Subjective:    Annual physical exam Misty Bishop is a 61 y.o. female who presents today for health maintenance and complete physical. She feels {DESC; WELL/FAIRLY WELL/POORLY:18703}. She reports exercising ***. She reports she is sleeping {DESC; WELL/FAIRLY WELL/POORLY:18703}.  -----------------------------------------------------------------  Diabetes Mellitus Type II, Follow-up:   Lab Results  Component Value Date   HGBA1C 8.8 09/04/2016   HGBA1C 7.5 05/15/2016   HGBA1C 6.8 04/18/2015   Last seen for diabetes 3 months ago.  Management since then includes; increased metformin. She reports {excellent/good/fair/poor:19665} compliance with treatment. She {ACTION; IS/IS [id number]} having side effects. *** Current symptoms include {Symptoms; diabetes:14075} and have been {Desc; course:15616}. Home blood sugar records: {diabetes glucometry results:16657}  Episodes of hypoglycemia? {yes***/no:17258}   Current Insulin Regimen: *** Most Recent Eye Exam: *** Weight trend: {trend:16658} Prior visit with dietician: {yes/no:17258} Current diet: {diet habits:16563} Current exercise: {exercise types:16438}  ------------------------------------------------------------------------   Hypertension, follow-up:  BP Readings from Last 3 Encounters:  09/04/16 128/80  05/15/16 132/80  05/09/16 (!) 141/82    She was last seen for hypertension 3 months ago.  BP at that visit was 128/80. Management since that visit includes; no changes.She reports {excellent/good/fair/poor:19665} compliance with treatment. She {ACTION; IS/IS HKV:[id number]} having side effects. *** She {is/is not:9024} exercising. She {is/is not:9024} adherent to low salt diet.   Outside blood pressures are ***. She is  experiencing {Symptoms; cardiac:12860}.  Patient denies {Symptoms; cardiac:12860}.   Cardiovascular risk factors include {cv risk factors:510}.  Use of agents associated with hypertension: {bp agents assoc with hypertension:511::"none"}.   ------------------------------------------------------------------------  Gastroesophageal reflux disease, esophagitis presence not specified From 09/04/2016-no changes.    Anxiety From 09/04/2016-started back on SSRI LEXAPRO 20 MG tablet.    Review of Systems  Social History      She  reports that she has been smoking Cigarettes.  She has been smoking about 1.50 packs per day. She has never used smokeless tobacco. She reports that she drinks alcohol. She reports that she does not use drugs.       Social History   Social History  . Marital status: Single    Spouse name: N/A  . Number of children: 1  . Years of education: N/A   Occupational History  . Disabled      disabeled due to chronic pain in low back- approved in 08/2005   Social History Main Topics  . Smoking status: Current Every Day Smoker    Packs/day: 1.50    Types: Cigarettes    Last attempt to quit: 03/18/2009  . Smokeless tobacco: Never Used  . Alcohol use 0.0 oz/week     Comment: occasional use  . Drug use: No  . Sexual activity: Not on file   Other Topics Concern  . Not on file   Social History Narrative   Disabled (back pain)   Originally from [place]   Has a daughter   Does not get regular exercise    Past Medical History:  Diagnosis Date  . Bipolar disorder ([place])   . Depression   . Headache(784.0)   . Palpitations    Holter monitor in 2008 showed PVCs  . Peptic ulcer disease   . PTSD (post-traumatic stress disorder)      Patient Active Problem List   Diagnosis  Date Noted  . Chronic fatigue syndrome 11/30/2015  . Hyperlipemia   . Bipolar disorder ([place])   . Anxiety 11/08/2014  . Ache in joint 11/08/2014  . Back ache 11/08/2014  . COPD (chronic  obstructive pulmonary disease) ([place]) 11/08/2014  . Degeneration of lumbar or lumbosacral intervertebral disc 11/08/2014  . Diabetes mellitus, type 2 ([place]) 11/08/2014  . Difficulty hearing 11/08/2014  . Cold sore 11/08/2014  . Personal history of reproductive and obstetrical problems 11/08/2014  . Symptomatic menopausal or female climacteric states 11/08/2014  . Sciatica 11/08/2014  . Chronic sinusitis 11/08/2014  . Non-alcoholic fatty liver disease 11/08/2014  . Compulsive tobacco user syndrome 11/08/2014  . Tremor 11/08/2014  . Achrochordon 11/08/2014  . Cutaneous skin tags 11/08/2014  . GERD (gastroesophageal reflux disease) 08/19/2014  . ANA positive 12/29/2013  . Arthritis, degenerative 12/29/2013  . CHEST PAIN-UNSPECIFIED 07/13/2009  . SHORTNESS OF BREATH 07/11/2009  . LBP (low back pain) 03/01/2007  . Allergic rhinitis 01/14/2007  . Chronic pain syndrome 01/14/2007  . Clinical depression 01/14/2007  . H/O peptic ulcer 01/14/2007  . Benign essential HTN 01/14/2007  . Meniere's disease 01/14/2007    Past Surgical History:  Procedure Laterality Date  . BREAST BIOPSY     Left, benign  . CESAREAN SECTION    . KNEE SURGERY     Left  . SHOULDER SURGERY     Left  . TONSILLECTOMY      Family History        Family Status  Relation Status  . Mother Deceased at age 15       died from MI  . Father Deceased at age 54       Skin cancer spread to prostate  . Sister Alive       paranoid schizophrenic  . Mat Aunt Deceased  . Sister (Not Specified)  . Sister (Not Specified)        Her family history includes COPD in her father and sister; Cancer in her father and maternal aunt; Diabetes in her sister; Heart attack (age of onset: 81) in her mother; Heart disease in her sister; Obesity in her sister; Schizophrenia in her sister.     Allergies  Allergen Reactions  . Levaquin  [Levofloxacin In D5w]     lip swelling, SOB.  Marland Kitchen Penicillins     Pt tolerates Cefdinir  . Sulfa  Antibiotics Other (See Comments) and Itching     Current Outpatient Prescriptions:  .  albuterol (VENTOLIN HFA) 108 (90 Base) MCG/ACT inhaler, Inhale 2 puffs into the lungs every 6 (six) hours as needed., Disp: 54 g, Rfl: 1 .  aspirin 81 MG EC tablet, Take 81 mg by mouth daily.  , Disp: , Rfl:  .  b complex vitamins capsule, Take 1 capsule by mouth Once PRN.  , Disp: , Rfl:  .  budesonide-formoterol (SYMBICORT) 160-4.5 MCG/ACT inhaler, Inhale 2 puffs into the lungs 2 (two) times daily., Disp: 3 Inhaler, Rfl: 4 .  clonazePAM (KLONOPIN) 1 MG tablet, Take by mouth. Take 1-2 tablets daily and 2 tablets at bedtime. , Disp: , Rfl:  .  dexlansoprazole (DEXILANT) 60 MG capsule, Take 1 capsule (60 mg total) by mouth daily., Disp: 90 capsule, Rfl: 3 .  diclofenac (VOLTAREN) 75 MG EC tablet, Take 1 tablet by mouth 2 (two) times daily as needed., Disp: , Rfl:  .  diclofenac sodium (VOLTAREN) 1 % GEL, APPLY TO AFFECTED AREA FOUR TIMES A DAY AS NEEDED, Disp: 300 g, Rfl: 4 .  escitalopram (LEXAPRO) 20 MG tablet, Take 1 tablet (20 mg total) by mouth daily. Take 1/2 tablet daily for six days, then increase to one full tablet daily, Disp: 90 tablet, Rfl: 4 .  fluticasone (FLONASE) 50 MCG/ACT nasal spray, Place 2 sprays into both nostrils daily., Disp: 48 g, Rfl: 4 .  glucose blood (ACCU-CHEK AVIVA PLUS) test strip, Use to check blood sugar once a day. E11.9, Disp: 100 each, Rfl: 4 .  HYDROcodone-acetaminophen (NORCO/VICODIN) 5-325 MG tablet, Take 1-2 tablets by mouth every 6 (six) hours as needed for moderate pain. 1-2 Tablets every 4-6 hours as needed, Disp: 135 tablet, Rfl: 0 .  loratadine (CLARITIN) 10 MG tablet, Take 1 tablet by mouth daily., Disp: , Rfl:  .  losartan-hydrochlorothiazide (HYZAAR) 50-12.5 MG tablet, Take 1 tablet by mouth daily., Disp: 90 tablet, Rfl: 4 .  metFORMIN (GLUCOPHAGE-XR) 500 MG 24 hr tablet, Take 2 tablets (1,000 mg total) by mouth daily with breakfast., Disp: 180 tablet, Rfl: 4 .   montelukast (SINGULAIR) 10 MG tablet, Take 1 tablet (10 mg total) by mouth daily., Disp: 90 tablet, Rfl: 4 .  Omega 3-6-9 Fatty Acids (OMEGA 3-6-9 COMPLEX) CAPS, Take 1 capsule by mouth daily., Disp: , Rfl:  .  Probiotic Product (PROBIOTIC PO), Take 3 tablets by mouth daily., Disp: , Rfl:  .  ranitidine (ZANTAC) 150 MG capsule, Take 1 capsule (150 mg total) by mouth 2 (two) times daily., Disp: 180 capsule, Rfl: 3 .  simvastatin (ZOCOR) 20 MG tablet, Take 1 tablet (20 mg total) by mouth at bedtime., Disp: 90 tablet, Rfl: 4 .  valACYclovir (VALTREX) 1000 MG tablet, Take 1 tablet by mouth. Every 8 hours for seven days as needed, Disp: , Rfl:  .  vitamin C (ASCORBIC ACID) 250 MG tablet, Take 4 tablets by mouth daily., Disp: , Rfl:    Patient Care Team: Birdie Sons, MD as PCP - General (Family Medicine)      Objective:   Vitals: There were no vitals taken for this visit.  There were no vitals filed for this visit.   Physical Exam   Depression Screen PHQ 2/9 Scores 05/15/2016 11/30/2015  PHQ - 2 Score 4 4  PHQ- 9 Score 14 13      Assessment & Plan:     Routine Health Maintenance and Physical Exam  Exercise Activities and Dietary recommendations Goals    None      Immunization History  Administered Date(s) Administered  . Tdap 01/14/2007    Health Maintenance  Topic Date Due  . FOOT EXAM  02/27/1966  . COLONOSCOPY  02/27/2006  . MAMMOGRAM  10/05/2016  . INFLUENZA VACCINE  10/16/2016  . PNEUMOCOCCAL POLYSACCHARIDE VACCINE (1) 02/27/2021 (Originally 02/27/1958)  . TETANUS/TDAP  01/13/2017  . HEMOGLOBIN A1C  03/06/2017  . OPHTHALMOLOGY EXAM  05/17/2017  . PAP SMEAR  06/03/2018  . Hepatitis C Screening  Completed  . HIV Screening  Completed     Discussed health benefits of physical activity, and encouraged her to engage in regular exercise appropriate for her age and condition.    --------------------------------------------------------------------    Lelon Huh, MD  [place]

## 2016-12-06 ENCOUNTER — Ambulatory Visit: Payer: Medicare HMO | Admitting: Family Medicine

## 2017-01-10 ENCOUNTER — Encounter: Payer: Medicare HMO | Admitting: Family Medicine

## 2017-01-10 ENCOUNTER — Ambulatory Visit: Payer: Medicare HMO

## 2017-01-16 IMAGING — CR DG CHEST 2V
1 series · 2 of 2 positions shown · non-contrast
Comparison: None.

CLINICAL DATA: Productive cough for 1 month.

EXAM:
CHEST  2 VIEW

[Series 1: dg chest 2 view · 0.14mm/px · 2 of 2 slices shown]
[im 1/2]
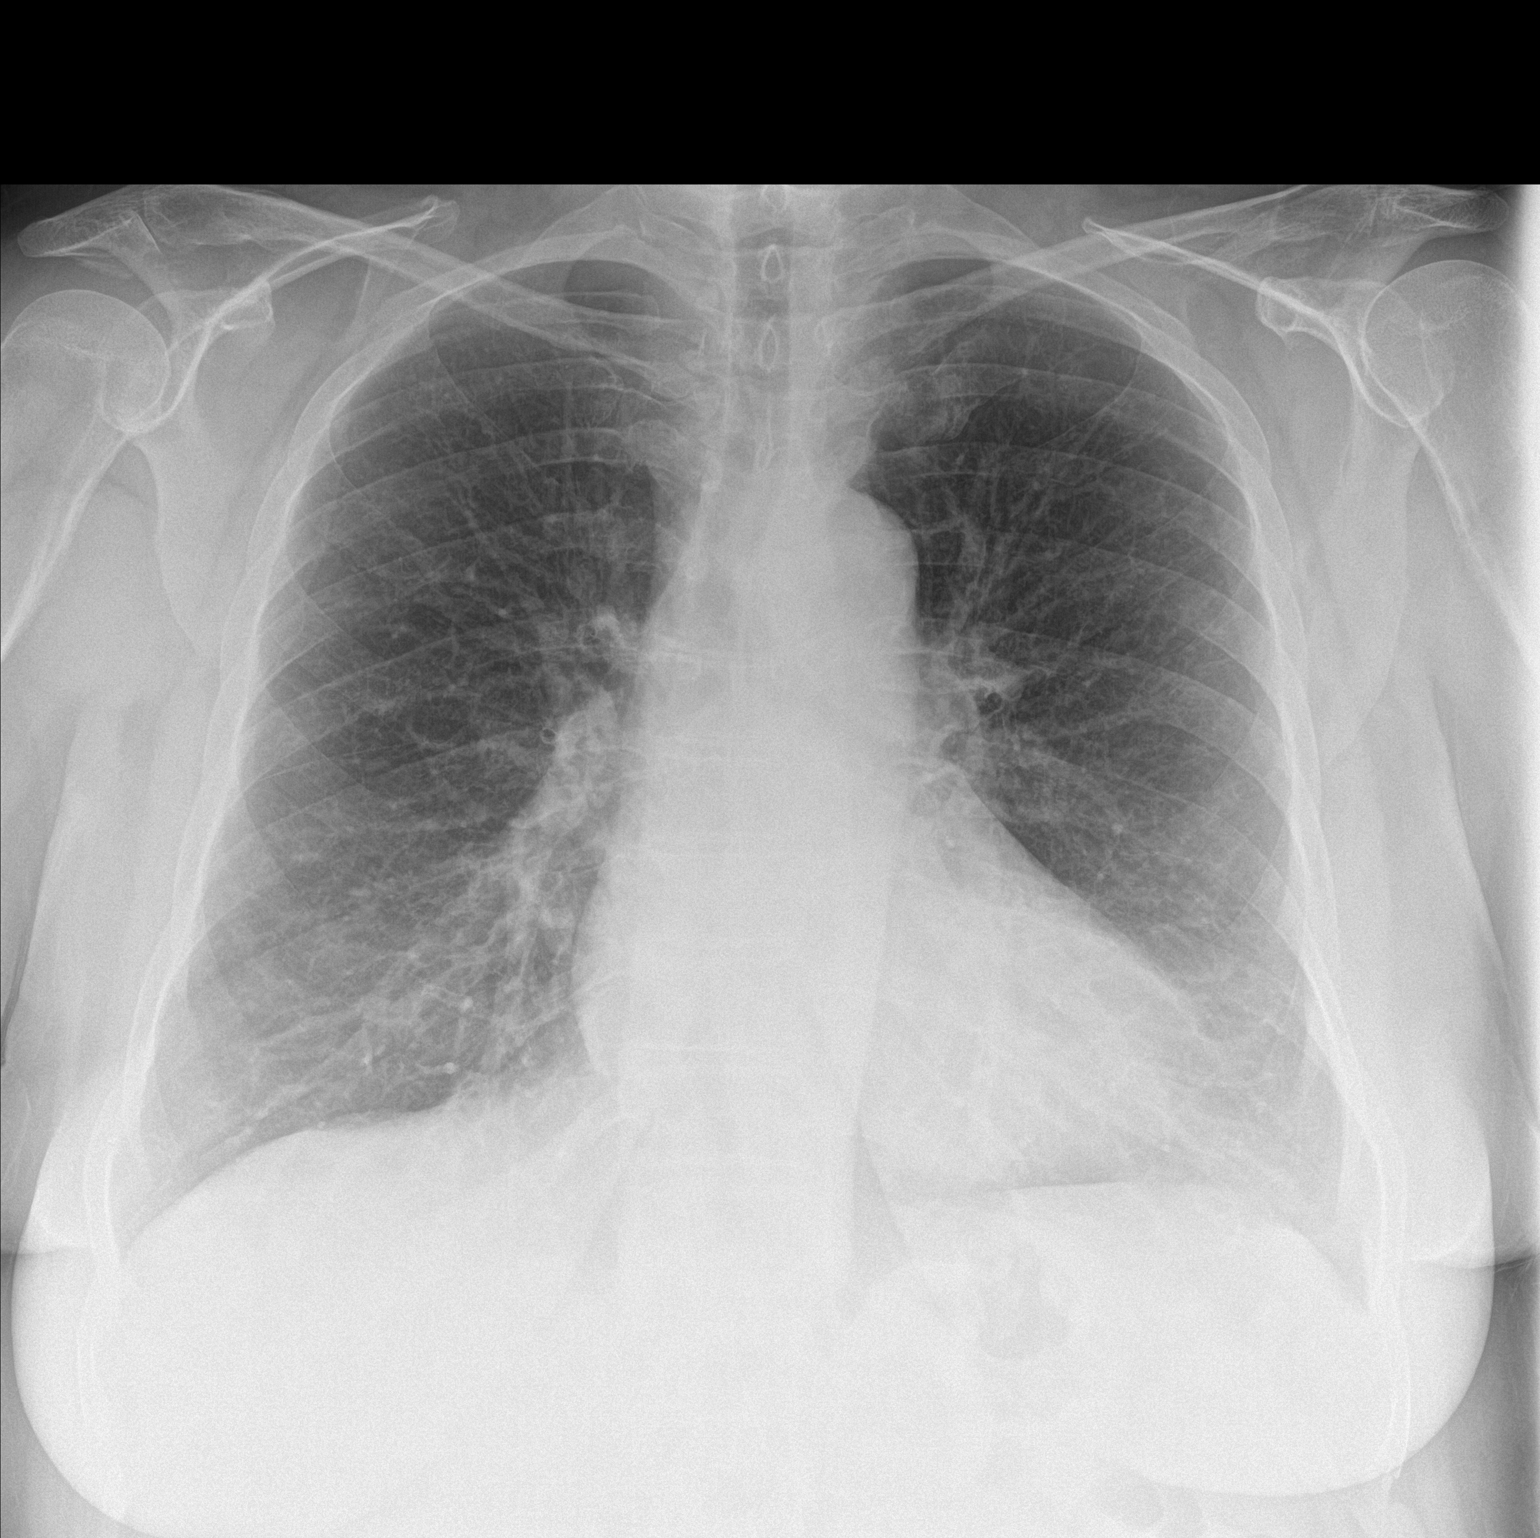
[im 2/2]
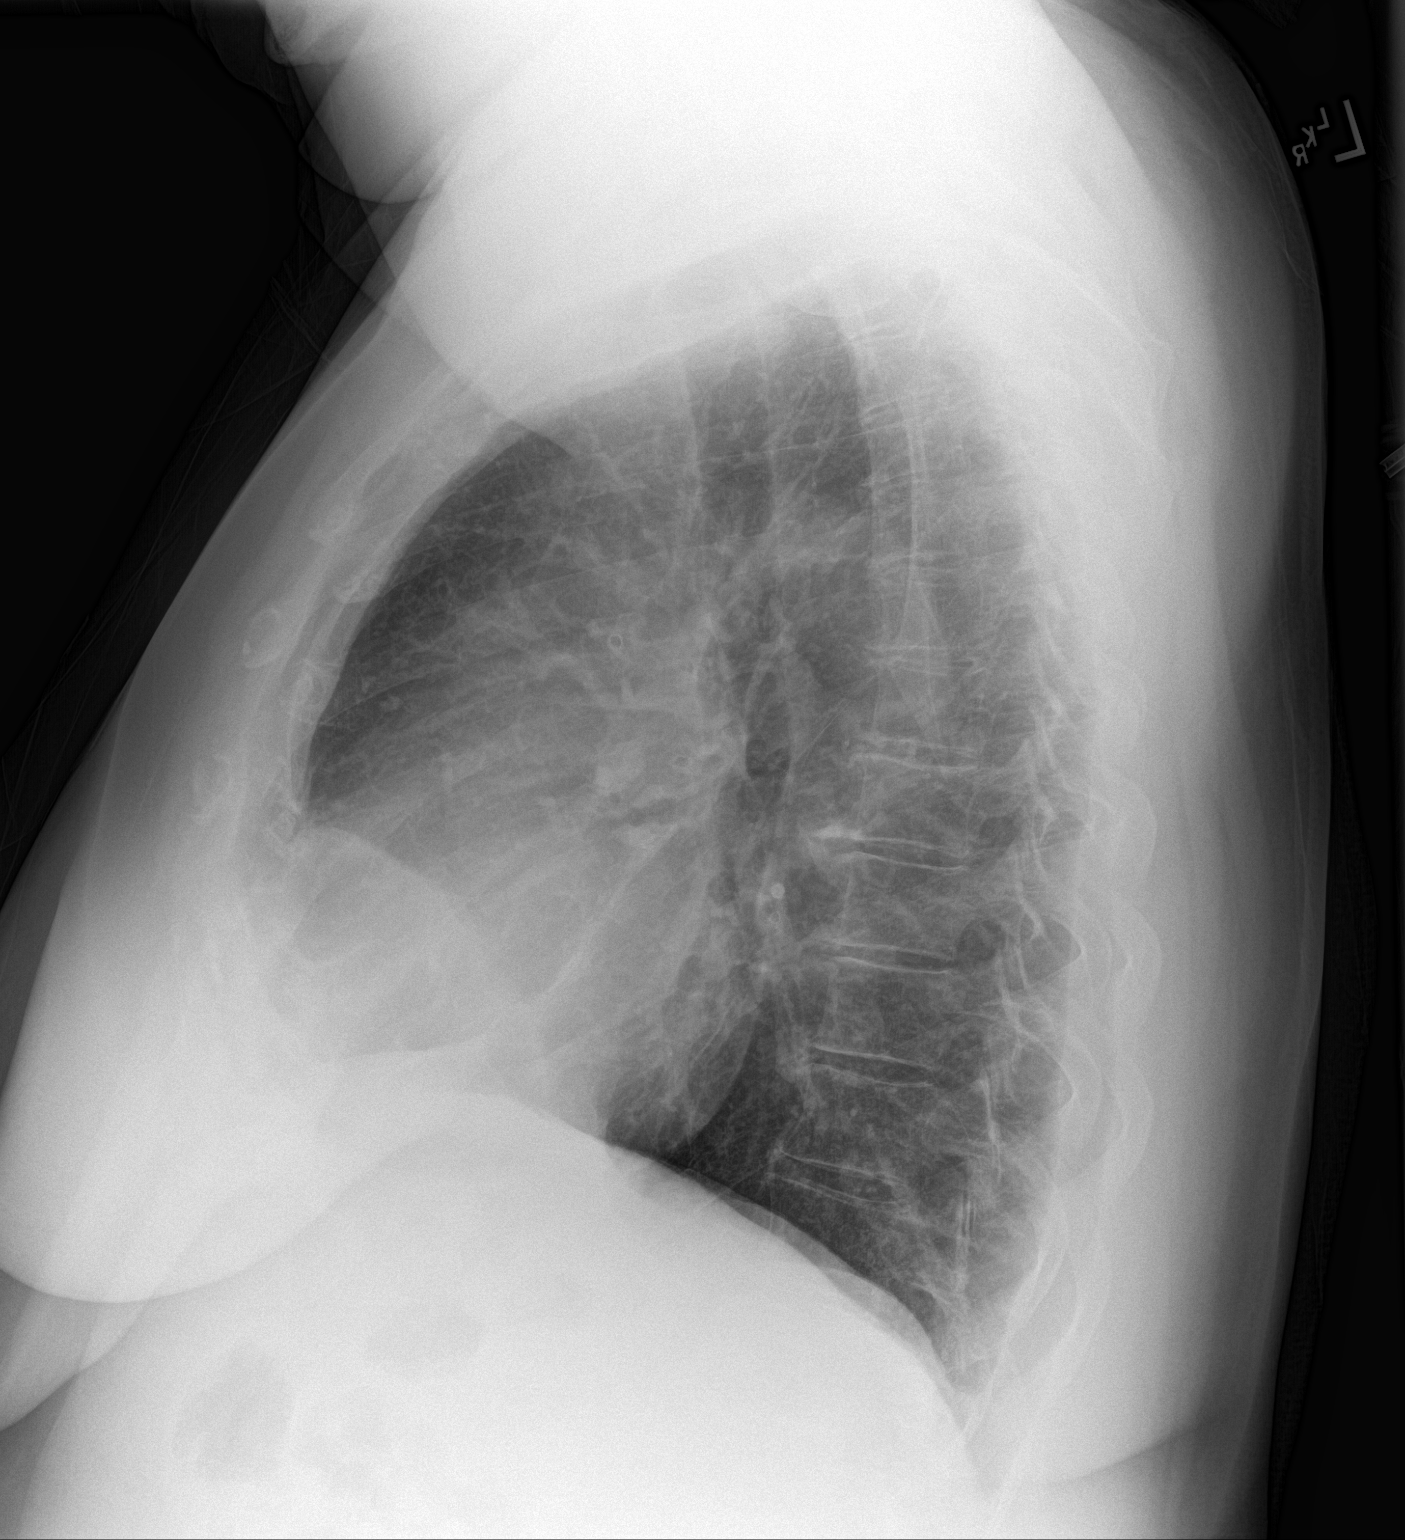

[2 of 2 positions shown; findings below may reference images not displayed]

FINDINGS: Normal mediastinum and cardiac silhouette. Normal pulmonary
vasculature. No evidence of effusion, infiltrate, or pneumothorax.
No acute bony abnormality.
IMPRESSION: No acute cardiopulmonary process.

## 2017-01-21 ENCOUNTER — Other Ambulatory Visit: Payer: Self-pay | Admitting: Family Medicine

## 2017-01-21 DIAGNOSIS — M5137 Other intervertebral disc degeneration, lumbosacral region: Secondary | ICD-10-CM

## 2017-01-21 DIAGNOSIS — K219 Gastro-esophageal reflux disease without esophagitis: Secondary | ICD-10-CM

## 2017-01-21 DIAGNOSIS — L918 Other hypertrophic disorders of the skin: Secondary | ICD-10-CM

## 2017-01-21 MED ORDER — DEXLANSOPRAZOLE 60 MG PO CPDR: 60.0000 mg | DELAYED_RELEASE_CAPSULE | Freq: Every day | ORAL | 4 refills | Status: DC

## 2017-01-21 MED ORDER — RANITIDINE HCL 150 MG PO CAPS: 150.0000 mg | ORAL_CAPSULE | Freq: Two times a day (BID) | ORAL | 4 refills | Status: DC

## 2017-01-21 MED ORDER — HYDROCODONE-ACETAMINOPHEN 5-325 MG PO TABS: 1.0000 | ORAL_TABLET | Freq: Four times a day (QID) | ORAL | 0 refills | Status: DC | PRN

## 2017-01-21 NOTE — Telephone Encounter (Signed)
Pt contacted office for refill request on the following medications:  1. dexlansoprazole (DEXILANT) 60 MG capsule 2. ranitidine (ZANTAC) 150 MG capsule  [facility] 90 Day Supply   3. HYDROcodone-acetaminophen (NORCO/VICODIN) 5-325 MG tablet  Last Rx: 07/25/16  Please advise. Thanks TNP

## 2017-02-07 ENCOUNTER — Ambulatory Visit (INDEPENDENT_AMBULATORY_CARE_PROVIDER_SITE_OTHER): Payer: Medicare HMO | Admitting: Family Medicine

## 2017-02-07 ENCOUNTER — Encounter: Payer: Self-pay | Admitting: Family Medicine

## 2017-02-07 VITALS — BP 132/72 | HR 88 | Temp 98.1°F | Resp 16 | Wt 248.0 lb

## 2017-02-07 DIAGNOSIS — D229 Melanocytic nevi, unspecified: Secondary | ICD-10-CM

## 2017-02-07 DIAGNOSIS — E785 Hyperlipidemia, unspecified: Secondary | ICD-10-CM | POA: Diagnosis not present

## 2017-02-07 DIAGNOSIS — Z0000 Encounter for general adult medical examination without abnormal findings: Secondary | ICD-10-CM

## 2017-02-07 DIAGNOSIS — R1084 Generalized abdominal pain: Secondary | ICD-10-CM

## 2017-02-07 DIAGNOSIS — I10 Essential (primary) hypertension: Secondary | ICD-10-CM | POA: Diagnosis not present

## 2017-02-07 DIAGNOSIS — G8929 Other chronic pain: Secondary | ICD-10-CM

## 2017-02-07 DIAGNOSIS — G894 Chronic pain syndrome: Secondary | ICD-10-CM

## 2017-02-07 DIAGNOSIS — M25561 Pain in right knee: Secondary | ICD-10-CM

## 2017-02-07 DIAGNOSIS — F17210 Nicotine dependence, cigarettes, uncomplicated: Secondary | ICD-10-CM

## 2017-02-07 DIAGNOSIS — F3177 Bipolar disorder, in partial remission, most recent episode mixed: Secondary | ICD-10-CM

## 2017-02-07 DIAGNOSIS — R079 Chest pain, unspecified: Secondary | ICD-10-CM

## 2017-02-07 DIAGNOSIS — J440 Chronic obstructive pulmonary disease with acute lower respiratory infection: Secondary | ICD-10-CM

## 2017-02-07 DIAGNOSIS — E119 Type 2 diabetes mellitus without complications: Secondary | ICD-10-CM

## 2017-02-07 DIAGNOSIS — J42 Unspecified chronic bronchitis: Secondary | ICD-10-CM | POA: Diagnosis not present

## 2017-02-07 DIAGNOSIS — Z23 Encounter for immunization: Secondary | ICD-10-CM | POA: Diagnosis not present

## 2017-02-07 DIAGNOSIS — Z1211 Encounter for screening for malignant neoplasm of colon: Secondary | ICD-10-CM

## 2017-02-07 DIAGNOSIS — J209 Acute bronchitis, unspecified: Secondary | ICD-10-CM

## 2017-02-07 MED ORDER — CEFDINIR 300 MG PO CAPS: 300.0000 mg | ORAL_CAPSULE | Freq: Two times a day (BID) | ORAL | 0 refills | Status: DC

## 2017-02-07 MED ORDER — DICLOFENAC SODIUM 75 MG PO TBEC: 75.0000 mg | DELAYED_RELEASE_TABLET | Freq: Two times a day (BID) | ORAL | 3 refills | Status: DC | PRN

## 2017-02-07 MED ORDER — TRAMADOL-ACETAMINOPHEN 37.5-325 MG PO TABS: 1.0000 | ORAL_TABLET | Freq: Four times a day (QID) | ORAL | 0 refills | Status: DC | PRN

## 2017-02-07 NOTE — Progress Notes (Deleted)
Patient: Misty Bishop, Female    DOB: Jan 27, 1956, 61 y.o.   MRN: [id number] Visit Date: 02/07/2017  Today's Provider: Lelon Huh, MD   No chief complaint on file.  Subjective:    Annual physical exam Misty Bishop is a 61 y.o. female who presents today for health maintenance and complete physical. She feels {DESC; WELL/FAIRLY WELL/POORLY:18703}. She reports exercising ***. She reports she is sleeping {DESC; WELL/FAIRLY WELL/POORLY:18703}.     Review of Systems  Social History      She  reports that she has been smoking cigarettes.  She has been smoking about 1.50 packs per day. she has never used smokeless tobacco. She reports that she drinks alcohol. She reports that she does not use drugs.       Social History   Socioeconomic History  . Marital status: Single    Spouse name: Not on file  . Number of children: 1  . Years of education: Not on file  . Highest education level: Not on file  Social Needs  . Financial resource strain: Not on file  . Food insecurity - worry: Not on file  . Food insecurity - inability: Not on file  . Transportation needs - medical: Not on file  . Transportation needs - non-medical: Not on file  Occupational History  . Occupation: Disabled     Comment: disabeled due to chronic pain in low back- approved in 08/2005  Tobacco Use  . Smoking status: Current Every Day Smoker    Packs/day: 1.50    Types: Cigarettes    Last attempt to quit: 03/18/2009    Years since quitting: 7.8  . Smokeless tobacco: Never Used  Substance and Sexual Activity  . Alcohol use: Yes    Alcohol/week: 0.0 oz    Comment: occasional use  . Drug use: No  . Sexual activity: Not on file  Other Topics Concern  . Not on file  Social History Narrative   Disabled (back pain)   Originally from [place]   Has a daughter   Does not get regular exercise    Past Medical History:  Diagnosis Date  . Bipolar disorder ([place])   . Depression   .  Headache(784.0)   . Palpitations    Holter monitor in 2008 showed PVCs  . Peptic ulcer disease   . PTSD (post-traumatic stress disorder)      Patient Active Problem List   Diagnosis Date Noted  . Chronic fatigue syndrome 11/30/2015  . Hyperlipemia   . Bipolar disorder ([place])   . Anxiety 11/08/2014  . Ache in joint 11/08/2014  . Back ache 11/08/2014  . COPD (chronic obstructive pulmonary disease) ([place]) 11/08/2014  . Degeneration of lumbar or lumbosacral intervertebral disc 11/08/2014  . Diabetes mellitus, type 2 ([place]) 11/08/2014  . Difficulty hearing 11/08/2014  . Cold sore 11/08/2014  . Personal history of reproductive and obstetrical problems 11/08/2014  . Symptomatic menopausal or female climacteric states 11/08/2014  . Sciatica 11/08/2014  . Chronic sinusitis 11/08/2014  . Non-alcoholic fatty liver disease 11/08/2014  . Compulsive tobacco user syndrome 11/08/2014  . Tremor 11/08/2014  . Achrochordon 11/08/2014  . Cutaneous skin tags 11/08/2014  . GERD (gastroesophageal reflux disease) 08/19/2014  . ANA positive 12/29/2013  . Arthritis, degenerative 12/29/2013  . CHEST PAIN-UNSPECIFIED 07/13/2009  . SHORTNESS OF BREATH 07/11/2009  . LBP (low back pain) 03/01/2007  . Allergic rhinitis 01/14/2007  . Chronic pain syndrome 01/14/2007  . Clinical depression 01/14/2007  .  H/O peptic ulcer 01/14/2007  . Benign essential HTN 01/14/2007  . Meniere's disease 01/14/2007    Past Surgical History:  Procedure Laterality Date  . BREAST BIOPSY     Left, benign  . CESAREAN SECTION    . KNEE SURGERY     Left  . SHOULDER SURGERY     Left  . TONSILLECTOMY      Family History        Family Status  Relation Name Status  . Mother  Deceased at age 56       died from MI  . Father  Deceased at age 19       Skin cancer spread to prostate  . Sister  Alive       paranoid schizophrenic  . Mat Aunt  Deceased  . Sister  (Not Specified)  . Sister  (Not Specified)        Her  family history includes COPD in her father and sister; Cancer in her father and maternal aunt; Diabetes in her sister; Heart attack (age of onset: 30) in her mother; Heart disease in her sister; Obesity in her sister; Schizophrenia in her sister.     Allergies  Allergen Reactions  . Levaquin  [Levofloxacin In D5w]     lip swelling, SOB.  Marland Kitchen Penicillins     Pt tolerates Cefdinir  . Sulfa Antibiotics Other (See Comments) and Itching     Current Outpatient Medications:  .  albuterol (VENTOLIN HFA) 108 (90 Base) MCG/ACT inhaler, Inhale 2 puffs into the lungs every 6 (six) hours as needed., Disp: 54 g, Rfl: 1 .  aspirin 81 MG EC tablet, Take 81 mg by mouth daily.  , Disp: , Rfl:  .  b complex vitamins capsule, Take 1 capsule by mouth Once PRN.  , Disp: , Rfl:  .  budesonide-formoterol (SYMBICORT) 160-4.5 MCG/ACT inhaler, Inhale 2 puffs into the lungs 2 (two) times daily., Disp: 3 Inhaler, Rfl: 4 .  clonazePAM (KLONOPIN) 1 MG tablet, Take by mouth. Take 1-2 tablets daily and 2 tablets at bedtime. , Disp: , Rfl:  .  dexlansoprazole (DEXILANT) 60 MG capsule, Take 1 capsule (60 mg total) daily by mouth., Disp: 90 capsule, Rfl: 4 .  diclofenac (VOLTAREN) 75 MG EC tablet, Take 1 tablet by mouth 2 (two) times daily as needed., Disp: , Rfl:  .  diclofenac sodium (VOLTAREN) 1 % GEL, APPLY TO AFFECTED AREA FOUR TIMES A DAY AS NEEDED, Disp: 300 g, Rfl: 4 .  escitalopram (LEXAPRO) 20 MG tablet, Take 1 tablet (20 mg total) by mouth daily. Take 1/2 tablet daily for six days, then increase to one full tablet daily, Disp: 90 tablet, Rfl: 4 .  fluticasone (FLONASE) 50 MCG/ACT nasal spray, Place 2 sprays into both nostrils daily., Disp: 48 g, Rfl: 4 .  glucose blood (ACCU-CHEK AVIVA PLUS) test strip, Use to check blood sugar once a day. E11.9, Disp: 100 each, Rfl: 4 .  HYDROcodone-acetaminophen (NORCO/VICODIN) 5-325 MG tablet, Take 1-2 tablets every 6 (six) hours as needed by mouth for moderate pain. 1-2 Tablets  every 4-6 hours as needed, Disp: 135 tablet, Rfl: 0 .  loratadine (CLARITIN) 10 MG tablet, Take 1 tablet by mouth daily., Disp: , Rfl:  .  losartan-hydrochlorothiazide (HYZAAR) 50-12.5 MG tablet, Take 1 tablet by mouth daily., Disp: 90 tablet, Rfl: 4 .  metFORMIN (GLUCOPHAGE-XR) 500 MG 24 hr tablet, Take 2 tablets (1,000 mg total) by mouth daily with breakfast., Disp: 180 tablet, Rfl: 4 .  montelukast (SINGULAIR) 10 MG tablet, Take 1 tablet (10 mg total) by mouth daily., Disp: 90 tablet, Rfl: 4 .  Omega 3-6-9 Fatty Acids (OMEGA 3-6-9 COMPLEX) CAPS, Take 1 capsule by mouth daily., Disp: , Rfl:  .  Probiotic Product (PROBIOTIC PO), Take 3 tablets by mouth daily., Disp: , Rfl:  .  ranitidine (ZANTAC) 150 MG capsule, Take 1 capsule (150 mg total) 2 (two) times daily by mouth., Disp: 180 capsule, Rfl: 4 .  simvastatin (ZOCOR) 20 MG tablet, Take 1 tablet (20 mg total) by mouth at bedtime., Disp: 90 tablet, Rfl: 4 .  valACYclovir (VALTREX) 1000 MG tablet, Take 1 tablet by mouth. Every 8 hours for seven days as needed, Disp: , Rfl:  .  vitamin C (ASCORBIC ACID) 250 MG tablet, Take 4 tablets by mouth daily., Disp: , Rfl:    Patient Care Team: Birdie Sons, MD as PCP - General (Family Medicine)      Objective:   Vitals: There were no vitals taken for this visit.  There were no vitals filed for this visit.   Physical Exam   Depression Screen PHQ 2/9 Scores 05/15/2016 11/30/2015  PHQ - 2 Score 4 4  PHQ- 9 Score 14 13      Assessment & Plan:     Routine Health Maintenance and Physical Exam  Exercise Activities and Dietary recommendations Goals    None      Immunization History  Administered Date(s) Administered  . Tdap 01/14/2007    Health Maintenance  Topic Date Due  . FOOT EXAM  02/27/1966  . COLONOSCOPY  02/27/2006  . MAMMOGRAM  10/05/2016  . INFLUENZA VACCINE  10/16/2016  . TETANUS/TDAP  01/13/2017  . PNEUMOCOCCAL POLYSACCHARIDE VACCINE (1) 02/27/2021 (Originally  02/27/1958)  . HEMOGLOBIN A1C  03/06/2017  . OPHTHALMOLOGY EXAM  05/17/2017  . PAP SMEAR  06/03/2018  . Hepatitis C Screening  Completed  . HIV Screening  Completed     Discussed health benefits of physical activity, and encouraged her to engage in regular exercise appropriate for her age and condition.       Misty Huh, MD  [facility]

## 2017-02-07 NOTE — Progress Notes (Signed)
Patient: Misty Bishop, Female    DOB: 13-Aug-1955, 61 y.o.   MRN: [id number] Visit Date: 02/07/2017  Today's Provider: Lelon Huh, MD   Chief Complaint  Patient presents with  . Annual Exam   Subjective:    Annual physical Misty Bishop is a 61 y.o. female. She feels well. She reports exercising not regularly. She reports she is sleeping poorly.  She has gyn exam with Dr. Marcelline Mates in 2015 and had normal pap with negative HPV. She does not want gyn or breast exam today, but is willing to return to [facility].   She has not had colonoscopy with the last 10 years. Has never had cologuard.     Hypertension, follow-up:  BP Readings from Last 3 Encounters:  02/07/17 132/72  09/04/16 128/80  05/15/16 132/80    She was last seen for hypertension 5 months ago.  BP at that visit was 128/80. Management since that visit includes no changes. She reports good compliance with treatment. She is not having side effects.  She is not exercising. She is adherent to low salt diet.   Outside blood pressures are checked occasionally. Patient denies lower extremity edema and palpitations.   Cardiovascular risk factors include diabetes mellitus and dyslipidemia.   Weight trend: stable Wt Readings from Last 3 Encounters:  02/07/17 248 lb (112.5 kg)  09/04/16 245 lb (111.1 kg)  05/15/16 256 lb (116.1 kg)    Current diet: well balanced   Diabetes Mellitus Type II, Follow-up:   Lab Results  Component Value Date   HGBA1C 8.8 09/04/2016   HGBA1C 7.5 05/15/2016   HGBA1C 6.8 04/18/2015    Last seen for diabetes 5 months ago.  Management since then includes increasing Metformin to 1000mg  daily. She reports good compliance with treatment. She is not having side effects.  Home blood sugar records: trend: fluctuating a bit  Episodes of hypoglycemia? no   Current Insulin Regimen: none Most Recent Eye Exam: due Weight trend: stable Prior visit with dietician:  no Current diet: well balanced Current exercise: none  Pertinent Labs:    Component Value Date/Time   CHOL 185 06/09/2014   CHOL 183 09/02/2011 0208   TRIG 139 06/09/2014   TRIG 214 (H) 09/02/2011 0208   HDL 37 06/09/2014   HDL 55 09/02/2011 0208   LDLCALC 120 06/09/2014   LDLCALC 85 09/02/2011 0208   CREATININE 0.89 09/12/2015 1623   CREATININE 1.01 09/02/2011 0208    Wt Readings from Last 3 Encounters:  02/07/17 248 lb (112.5 kg)  09/04/16 245 lb (111.1 kg)  05/15/16 256 lb (116.1 kg)     GERD, follow up: Patient was last seen 5 months ago. No changes were made in her medications. She is currently taking Zantac 150mg  daily, and reports good symptom control.   Right knee pain  Complains of right knee pain when twisting knee for the last four months.   Follow up COPD, using Symbicort every day and seems to be helping.   She continues to get short of breath with minimal exertion, but improved since starting Symbicort.   Fibromyalgia She continues to have daily myalgia and generalized fatigued. Pain is exacerbated by strenuous activities. She states that she has not been able to do most household chores including bathing and cleaning for many years without assistance. She reports that she has required assistance of family member who will no longer be able to help her activities of daily living after this month.  Bipolar disorder  Continues on escitalopram and clonazepam and feels that combination is working well and wishes to continue unchanged, mood has been pretty good and having no manic episodes  She complains of persistent coughing and wheezing in her chest for the last week, no fevers, chills or sweats.   She also has several moles she is concerned about and want checked out. Does not have a dermatologist but would like to see one.    Review of Systems  Constitutional: Positive for fatigue.  HENT: Positive for hearing loss, postnasal drip and tinnitus.    Eyes: Positive for photophobia.  Respiratory: Positive for cough and wheezing.   Cardiovascular: Positive for chest pain and leg swelling.  Gastrointestinal: Positive for abdominal distention, abdominal pain, constipation, diarrhea and nausea.  Endocrine: Negative.   Genitourinary: Positive for urgency.  Musculoskeletal: Positive for arthralgias, back pain and myalgias.  Skin: Negative.   Allergic/Immunologic: Positive for environmental allergies.  Neurological: Positive for headaches.  Hematological: Negative.   Psychiatric/Behavioral: Positive for agitation, decreased concentration and sleep disturbance. The patient is nervous/anxious.     Social History   Socioeconomic History  . Marital status: Single    Spouse name: Not on file  . Number of children: 1  . Years of education: Not on file  . Highest education level: Not on file  Social Needs  . Financial resource strain: Not on file  . Food insecurity - worry: Not on file  . Food insecurity - inability: Not on file  . Transportation needs - medical: Not on file  . Transportation needs - non-medical: Not on file  Occupational History  . Occupation: Disabled     Comment: disabeled due to chronic pain in low back- approved in 08/2005  Tobacco Use  . Smoking status: Current Every Day Smoker    Packs/day: 1.50    Types: Cigarettes    Last attempt to quit: 03/18/2009    Years since quitting: 7.8  . Smokeless tobacco: Never Used  Substance and Sexual Activity  . Alcohol use: Yes    Alcohol/week: 0.0 oz    Comment: occasional use  . Drug use: No  . Sexual activity: Not on file  Other Topics Concern  . Not on file  Social History Narrative   Disabled (back pain)   Originally from [place]   Has a daughter   Does not get regular exercise    Past Medical History:  Diagnosis Date  . Bipolar disorder ([place])   . Depression   . Headache(784.0)   . Palpitations    Holter monitor in 2008 showed PVCs  . Peptic ulcer  disease   . PTSD (post-traumatic stress disorder)      Patient Active Problem List   Diagnosis Date Noted  . Chronic fatigue syndrome 11/30/2015  . Hyperlipemia   . Bipolar disorder ([place])   . Anxiety 11/08/2014  . Ache in joint 11/08/2014  . Back ache 11/08/2014  . COPD (chronic obstructive pulmonary disease) ([place]) 11/08/2014  . Degeneration of lumbar or lumbosacral intervertebral disc 11/08/2014  . Diabetes mellitus, type 2 ([place]) 11/08/2014  . Difficulty hearing 11/08/2014  . Cold sore 11/08/2014  . Personal history of reproductive and obstetrical problems 11/08/2014  . Symptomatic menopausal or female climacteric states 11/08/2014  . Sciatica 11/08/2014  . Chronic sinusitis 11/08/2014  . Non-alcoholic fatty liver disease 11/08/2014  . Compulsive tobacco user syndrome 11/08/2014  . Tremor 11/08/2014  . Achrochordon 11/08/2014  . Cutaneous skin tags 11/08/2014  . GERD (  gastroesophageal reflux disease) 08/19/2014  . ANA positive 12/29/2013  . Arthritis, degenerative 12/29/2013  . CHEST PAIN-UNSPECIFIED 07/13/2009  . SHORTNESS OF BREATH 07/11/2009  . LBP (low back pain) 03/01/2007  . Allergic rhinitis 01/14/2007  . Chronic pain syndrome 01/14/2007  . Clinical depression 01/14/2007  . H/O peptic ulcer 01/14/2007  . Benign essential HTN 01/14/2007  . Meniere's disease 01/14/2007    Past Surgical History:  Procedure Laterality Date  . BREAST BIOPSY     Left, benign  . CESAREAN SECTION    . KNEE SURGERY     Left  . SHOULDER SURGERY     Left  . TONSILLECTOMY      Her family history includes COPD in her father and sister; Cancer in her father and maternal aunt; Diabetes in her sister; Heart attack (age of onset: 19) in her mother; Heart disease in her sister; Obesity in her sister; Schizophrenia in her sister.      Current Outpatient Medications:  .  albuterol (VENTOLIN HFA) 108 (90 Base) MCG/ACT inhaler, Inhale 2 puffs into the lungs every 6 (six) hours as needed.,  Disp: 54 g, Rfl: 1 .  aspirin 81 MG EC tablet, Take 81 mg by mouth daily.  , Disp: , Rfl:  .  b complex vitamins capsule, Take 1 capsule by mouth Once PRN.  , Disp: , Rfl:  .  budesonide-formoterol (SYMBICORT) 160-4.5 MCG/ACT inhaler, Inhale 2 puffs into the lungs 2 (two) times daily., Disp: 3 Inhaler, Rfl: 4 .  clonazePAM (KLONOPIN) 1 MG tablet, Take by mouth. Take 1-2 tablets daily and 2 tablets at bedtime. , Disp: , Rfl:  .  dexlansoprazole (DEXILANT) 60 MG capsule, Take 1 capsule (60 mg total) daily by mouth., Disp: 90 capsule, Rfl: 4 .  diclofenac (VOLTAREN) 75 MG EC tablet, Take 1 tablet by mouth 2 (two) times daily as needed., Disp: , Rfl:  .  diclofenac sodium (VOLTAREN) 1 % GEL, APPLY TO AFFECTED AREA FOUR TIMES A DAY AS NEEDED, Disp: 300 g, Rfl: 4 .  escitalopram (LEXAPRO) 20 MG tablet, Take 1 tablet (20 mg total) by mouth daily. Take 1/2 tablet daily for six days, then increase to one full tablet daily, Disp: 90 tablet, Rfl: 4 .  fluticasone (FLONASE) 50 MCG/ACT nasal spray, Place 2 sprays into both nostrils daily., Disp: 48 g, Rfl: 4 .  glucose blood (ACCU-CHEK AVIVA PLUS) test strip, Use to check blood sugar once a day. E11.9, Disp: 100 each, Rfl: 4 .  HYDROcodone-acetaminophen (NORCO/VICODIN) 5-325 MG tablet, Take 1-2 tablets every 6 (six) hours as needed by mouth for moderate pain. 1-2 Tablets every 4-6 hours as needed, Disp: 135 tablet, Rfl: 0 .  loratadine (CLARITIN) 10 MG tablet, Take 1 tablet by mouth daily., Disp: , Rfl:  .  losartan-hydrochlorothiazide (HYZAAR) 50-12.5 MG tablet, Take 1 tablet by mouth daily., Disp: 90 tablet, Rfl: 4 .  metFORMIN (GLUCOPHAGE-XR) 500 MG 24 hr tablet, Take 2 tablets (1,000 mg total) by mouth daily with breakfast., Disp: 180 tablet, Rfl: 4 .  montelukast (SINGULAIR) 10 MG tablet, Take 1 tablet (10 mg total) by mouth daily., Disp: 90 tablet, Rfl: 4 .  Omega 3-6-9 Fatty Acids (OMEGA 3-6-9 COMPLEX) CAPS, Take 1 capsule by mouth daily., Disp: , Rfl:  .   Probiotic Product (PROBIOTIC PO), Take 3 tablets by mouth daily., Disp: , Rfl:  .  ranitidine (ZANTAC) 150 MG capsule, Take 1 capsule (150 mg total) 2 (two) times daily by mouth., Disp: 180 capsule, Rfl:  4 .  simvastatin (ZOCOR) 20 MG tablet, Take 1 tablet (20 mg total) by mouth at bedtime., Disp: 90 tablet, Rfl: 4 .  valACYclovir (VALTREX) 1000 MG tablet, Take 1 tablet by mouth. Every 8 hours for seven days as needed, Disp: , Rfl:  .  vitamin C (ASCORBIC ACID) 250 MG tablet, Take 4 tablets by mouth daily., Disp: , Rfl:   Patient Care Team: Birdie Sons, MD as PCP - General (Family Medicine)     Objective:   Vitals: BP 132/72 (BP Location: Left Arm, Patient Position: Sitting, Cuff Size: Large)   Pulse 88   Temp 98.1 F (36.7 C)   Resp 16   Wt 248 lb (112.5 kg)   SpO2 92%   BMI 38.84 kg/m   Physical Exam   General Appearance:    Alert, cooperative, no distress, appears stated age  Head:    Normocephalic, without obvious abnormality, atraumatic  Eyes:    PERRL, conjunctiva/corneas clear, EOM's intact, fundi    benign, both eyes  Ears:    Normal TM's and external ear canals, both ears  Nose:   Nares normal, septum midline, mucosa normal, no drainage    or sinus tenderness  Throat:   Lips, mucosa, and tongue normal; teeth and gums normal  Neck:   Supple, symmetrical, trachea midline, no adenopathy;    thyroid:  no enlargement/tenderness/nodules; no carotid   bruit or JVD  Back:     Symmetric, no curvature, ROM normal, no CVA tenderness  Lungs:     Occasional expiratory wheeze, no rales, , respirations unlabored  Chest Wall:    No tenderness or deformity   Heart:    Regular rate and rhythm, S1 and S2 normal, no murmur, rub   or gallop  Breast Exam:    deferred  Abdomen:     Soft, non-tender, bowel sounds active all four quadrants,    no masses, no organomegaly  Pelvic:    deferred  Extremities:   Extremities normal, atraumatic, no cyanosis, 1+ bipedal edema.   Pulses:    2+ and symmetric all extremities  Skin:   Skin color, texture, turgor normal, no rashes or lesions  Lymph nodes:   Cervical, supraclavicular, and axillary nodes normal  Neurologic:   CNII-XII intact, normal strength, sensation and reflexes    throughout    Activities of Daily Living In your present state of health, do you have any difficulty performing the following activities: 02/07/2017  Hearing? Y  Vision? N  Difficulty concentrating or making decisions? Y  Walking or climbing stairs? Y  Dressing or bathing? Y  Doing errands, shopping? Y  Some recent data might be hidden    Fall Risk Assessment Fall Risk  02/07/2017 11/30/2015  Falls in the past year? Yes No  Number falls in past yr: 2 or more -  Injury with Fall? No -     Depression Screen PHQ 2/9 Scores 02/07/2017 05/15/2016 11/30/2015  PHQ - 2 Score 4 4 4   PHQ- 9 Score 16 14 13     Cognitive Testing - 6-CIT 6CIT Screen 02/07/2017  What Year? 0 points  What month? 0 points  What time? 0 points  Count back from 20 0 points  Months in reverse 0 points  Repeat phrase 0 points  Total Score 0        Assessment & Plan:    Annual physical Reviewed patient's Family Medical History Reviewed and updated list of patient's medical providers Assessment of cognitive impairment was  done Assessed patient's functional ability Established a written schedule for health screening [place] Completed and Reviewed  Exercise Activities and Dietary recommendations Goals    None      Immunization History  Administered Date(s) Administered  . Tdap 01/14/2007    Health Maintenance  Topic Date Due  . FOOT EXAM  02/27/1966  . COLONOSCOPY  02/27/2006  . MAMMOGRAM  10/05/2016  . INFLUENZA VACCINE  10/16/2016  . TETANUS/TDAP  01/13/2017  . PNEUMOCOCCAL POLYSACCHARIDE VACCINE (1) 02/27/2021 (Originally 02/27/1958)  . HEMOGLOBIN A1C  03/06/2017  . OPHTHALMOLOGY EXAM  05/17/2017  . PAP SMEAR  06/03/2018   . Hepatitis C Screening  Completed  . HIV Screening  Completed     Discussed health benefits of physical activity, and encouraged her to engage in regular exercise appropriate for her age and condition.   1. Annual physical exam Recommended Shingrix vaccine and for to check on insurance coverage.   2. Type 2 diabetes mellitus without complication, without long-term current use of insulin (HCC)  - Hemoglobin A1c  3. Benign essential HTN Well controlled.  Continue current medications.    4. Chronic bronchitis, unspecified chronic bronchitis type ([place]) Doing well on symbicort.   5. Chronic pain syndrome Continue current medications.    6. Bipolar disorder, in partial remission, most recent episode mixed ([place]) Continue current psychotropics.   7. Hyperlipidemia, unspecified hyperlipidemia type She is tolerating simvastatin well with no adverse effects.   - COMPLETE METABOLIC PANEL WITH GFR - CBC - Lipid panel  8. Influenza vaccine needed  - Flu Vaccine QUAD 6+ mos PF IM (Fluarix Quad PF)  9. Smoking greater than 30 pack years  - Ambulatory Referral for Lung Cancer Scre - Pneumococcal polysaccharide vaccine 23-valent greater than or equal to 2yo subcutaneous/IM  10. Colon cancer screening  - Ambulatory referral to Gastroenterology  11. Chest pain, unspecified type Multiple cardiac risk factors.  - Ambulatory referral to Cardiology  12. Chronic pain of right knee  - Ambulatory referral to Orthopedic Surgery - diclofenac (VOLTAREN) 75 MG EC tablet; Take 1 tablet (75 mg total) by mouth 2 (two) times daily as needed.  Dispense: 180 tablet; Refill: 3 - traMADol-acetaminophen (ULTRACET) 37.5-325 MG tablet; Take 1 tablet by mouth every 6 (six) hours as needed.  Dispense: 180 tablet; Refill: 0  13. Acute bronchitis with COPD ([place])  - cefdinir (OMNICEF) 300 MG capsule; Take 1 capsule (300 mg total) by mouth 2 (two) times daily.  Dispense: 20 capsule; Refill: 0  14.  Generalized abdominal pain - US Abdomen Complete; Future  15. Benign skin mole  - Ambulatory referral to Dermatology  Has been disabled for many years due to multitude of physical and psychiatric disorders. Has limited mobility and unable to safely and adequately perform ADLs due to these medical conditions. Has required assistance for ADLs, but needs new referral for home health aid.   Addressed extensive list of chronic and acute medical problems today requiring extensive time in counseling and coordination of care. Expended 25 minutes of time counseling and coordinating care of her multiple medication problems, in addition to time spend on preventative health issues.   Lelon Huh, MD  [facility]

## 2017-02-10 ENCOUNTER — Telehealth: Payer: Self-pay | Admitting: Family Medicine

## 2017-02-10 NOTE — Telephone Encounter (Signed)
Has already been ordered, she should get a call from the cancer center within the next week to schedule.

## 2017-02-10 NOTE — Telephone Encounter (Signed)
Pt states she spoke with you about a lung cancer screening and wants to know if you will order this for her

## 2017-02-10 NOTE — Telephone Encounter (Signed)
Please advise 

## 2017-02-11 ENCOUNTER — Telehealth: Payer: Self-pay | Admitting: *Deleted

## 2017-02-11 ENCOUNTER — Telehealth: Payer: Self-pay | Admitting: Family Medicine

## 2017-02-11 DIAGNOSIS — M1990 Unspecified osteoarthritis, unspecified site: Secondary | ICD-10-CM

## 2017-02-11 DIAGNOSIS — J42 Unspecified chronic bronchitis: Secondary | ICD-10-CM

## 2017-02-11 DIAGNOSIS — G9332 Myalgic encephalomyelitis/chronic fatigue syndrome: Secondary | ICD-10-CM

## 2017-02-11 DIAGNOSIS — R5382 Chronic fatigue, unspecified: Secondary | ICD-10-CM

## 2017-02-11 DIAGNOSIS — M5137 Other intervertebral disc degeneration, lumbosacral region: Secondary | ICD-10-CM

## 2017-02-11 DIAGNOSIS — R0602 Shortness of breath: Secondary | ICD-10-CM

## 2017-02-11 MED ORDER — PREDNISONE 10 MG PO TABS: ORAL_TABLET | ORAL | 0 refills | Status: AC

## 2017-02-11 MED ORDER — BUDESONIDE-FORMOTEROL FUMARATE 160-4.5 MCG/ACT IN AERO: 2.0000 | INHALATION_SPRAY | Freq: Two times a day (BID) | RESPIRATORY_TRACT | 6 refills | Status: DC

## 2017-02-11 MED ORDER — AZITHROMYCIN 250 MG PO TABS: ORAL_TABLET | ORAL | 0 refills | Status: AC

## 2017-02-11 NOTE — Telephone Encounter (Signed)
Pt called back asking about her medication. Pt request Sharyn Lull return her. Thanks TNP

## 2017-02-11 NOTE — Telephone Encounter (Signed)
Have sent prescription for symbicort, prednisone and an antibiotic [facility],if she has any fevers chest pains or shortness of breath then she needs to go to urgent care.

## 2017-02-11 NOTE — Telephone Encounter (Signed)
Called pt back for more info. Patient has symptoms of wheezing, coughing, fatigue, sob, sore throat and ear pain. Patient also needs a refill for Symbicort, [facility] [place]. Please advise?

## 2017-02-11 NOTE — Telephone Encounter (Signed)
Patient said she has a home health nurse who will be leaving this Friday. Patient is trying to get another home health nurse to help her at home with her daily needs. Patient states she has been approved through [facility] for more home health. However [employer] told patient that her pcp will need to call them to do a PA. [employer] approval dept. [phone]. Please advise?

## 2017-02-11 NOTE — Telephone Encounter (Signed)
Patient advised.

## 2017-02-11 NOTE — Telephone Encounter (Signed)
Pt stated that her blood oxygen level was 89 yesterday at her OV. Pt is requesting an Rx for prednisone be sent to [facility] and would also like a chest Xray ordered. Please advise. Thanks TNP

## 2017-02-12 ENCOUNTER — Telehealth: Payer: Self-pay | Admitting: *Deleted

## 2017-02-12 DIAGNOSIS — Z87891 Personal history of nicotine dependence: Secondary | ICD-10-CM

## 2017-02-12 DIAGNOSIS — Z122 Encounter for screening for malignant neoplasm of respiratory organs: Secondary | ICD-10-CM

## 2017-02-12 NOTE — Telephone Encounter (Signed)
Patient was advised.  

## 2017-02-12 NOTE — Telephone Encounter (Signed)
Received referral for initial lung cancer screening scan. Contacted patient and obtained smoking history,(current, 51 pack year) as well as answering questions related to screening process. Patient denies signs of lung cancer such as weight loss or hemoptysis. Patient denies comorbidity that would prevent curative treatment if lung cancer were found. Patient is scheduled for shared decision making visit and CT scan on 02/28/17.

## 2017-02-13 ENCOUNTER — Ambulatory Visit
Admission: RE | Admit: 2017-02-13 | Discharge: 2017-02-13 | Disposition: A | Discharge status: Home / Self Care | Payer: Medicare HMO | Source: Ambulatory Visit | Attending: Family Medicine | Admitting: Family Medicine

## 2017-02-13 DIAGNOSIS — R932 Abnormal findings on diagnostic imaging of liver and biliary tract: Secondary | ICD-10-CM | POA: Diagnosis not present

## 2017-02-13 DIAGNOSIS — J440 Chronic obstructive pulmonary disease with acute lower respiratory infection: Secondary | ICD-10-CM | POA: Diagnosis not present

## 2017-02-13 DIAGNOSIS — J209 Acute bronchitis, unspecified: Secondary | ICD-10-CM | POA: Insufficient documentation

## 2017-02-13 DIAGNOSIS — N281 Cyst of kidney, acquired: Secondary | ICD-10-CM | POA: Diagnosis not present

## 2017-02-13 NOTE — Telephone Encounter (Signed)
Please contact [employer] as below regarding approval for home health aid. Had face-to-face visit on 02-07-17

## 2017-02-14 ENCOUNTER — Telehealth: Payer: Self-pay | Admitting: Family Medicine

## 2017-02-14 DIAGNOSIS — R2689 Other abnormalities of gait and mobility: Secondary | ICD-10-CM

## 2017-02-14 DIAGNOSIS — R2681 Unsteadiness on feet: Secondary | ICD-10-CM

## 2017-02-14 NOTE — Telephone Encounter (Signed)
Please advise 

## 2017-02-14 NOTE — Telephone Encounter (Signed)
Yes she could use physical therapy due to balance and gait problems.

## 2017-02-14 NOTE — Telephone Encounter (Signed)
Pt's insurance will not cover for assistive care such as help with bathing without a need for PT,OT etc.Is pt  appropriate for any other need ? I saw where she has had a couple of falls this year.

## 2017-02-17 NOTE — Telephone Encounter (Signed)
Order should be to home health with message in note that PT is being ordered,Thanks

## 2017-02-17 NOTE — Telephone Encounter (Signed)
PT order has been placed. 

## 2017-02-17 NOTE — Telephone Encounter (Signed)
Please add PT to the order,Thanks

## 2017-02-17 NOTE — Telephone Encounter (Signed)
OK for home health referral for PT and home health Aide.

## 2017-02-17 NOTE — Telephone Encounter (Signed)
Dr. Caryn Section, do I cancel PT order and place home health order? Or do I just add the home health order? Please advise. Thanks!

## 2017-02-17 NOTE — Addendum Note (Signed)
Addended by: Wilder Glade on: 02/17/2017 04:34 PM   Modules accepted: Orders

## 2017-02-17 NOTE — Telephone Encounter (Signed)
Pt is needing home health care referral to include PT and home health aid

## 2017-02-19 DIAGNOSIS — G8929 Other chronic pain: Secondary | ICD-10-CM | POA: Diagnosis not present

## 2017-02-19 DIAGNOSIS — M479 Spondylosis, unspecified: Secondary | ICD-10-CM | POA: Diagnosis not present

## 2017-02-19 DIAGNOSIS — M170 Bilateral primary osteoarthritis of knee: Secondary | ICD-10-CM | POA: Diagnosis not present

## 2017-02-19 DIAGNOSIS — M797 Fibromyalgia: Secondary | ICD-10-CM | POA: Diagnosis not present

## 2017-02-19 DIAGNOSIS — J449 Chronic obstructive pulmonary disease, unspecified: Secondary | ICD-10-CM | POA: Diagnosis not present

## 2017-02-19 DIAGNOSIS — M25561 Pain in right knee: Secondary | ICD-10-CM | POA: Diagnosis not present

## 2017-02-26 DIAGNOSIS — J449 Chronic obstructive pulmonary disease, unspecified: Secondary | ICD-10-CM | POA: Diagnosis not present

## 2017-02-26 DIAGNOSIS — M479 Spondylosis, unspecified: Secondary | ICD-10-CM | POA: Diagnosis not present

## 2017-02-26 DIAGNOSIS — G8929 Other chronic pain: Secondary | ICD-10-CM | POA: Diagnosis not present

## 2017-02-26 DIAGNOSIS — M797 Fibromyalgia: Secondary | ICD-10-CM | POA: Diagnosis not present

## 2017-02-26 DIAGNOSIS — M170 Bilateral primary osteoarthritis of knee: Secondary | ICD-10-CM | POA: Diagnosis not present

## 2017-02-26 DIAGNOSIS — M25561 Pain in right knee: Secondary | ICD-10-CM | POA: Diagnosis not present

## 2017-02-27 ENCOUNTER — Encounter: Payer: Self-pay | Admitting: Nurse Practitioner

## 2017-02-28 ENCOUNTER — Inpatient Hospital Stay: Payer: Medicare HMO | Attending: Nurse Practitioner | Admitting: Nurse Practitioner

## 2017-02-28 ENCOUNTER — Ambulatory Visit
Admission: RE | Admit: 2017-02-28 | Discharge: 2017-02-28 | Disposition: A | Discharge status: Home / Self Care | Payer: Medicare HMO | Source: Ambulatory Visit | Attending: Nurse Practitioner | Admitting: Nurse Practitioner

## 2017-02-28 ENCOUNTER — Telehealth: Payer: Self-pay | Admitting: Family Medicine

## 2017-02-28 DIAGNOSIS — F17210 Nicotine dependence, cigarettes, uncomplicated: Secondary | ICD-10-CM

## 2017-02-28 DIAGNOSIS — J439 Emphysema, unspecified: Secondary | ICD-10-CM | POA: Insufficient documentation

## 2017-02-28 DIAGNOSIS — I700 Atherosclerosis of aorta: Secondary | ICD-10-CM | POA: Insufficient documentation

## 2017-02-28 DIAGNOSIS — Z122 Encounter for screening for malignant neoplasm of respiratory organs: Secondary | ICD-10-CM

## 2017-02-28 DIAGNOSIS — Z87891 Personal history of nicotine dependence: Secondary | ICD-10-CM | POA: Diagnosis not present

## 2017-02-28 DIAGNOSIS — Z136 Encounter for screening for cardiovascular disorders: Secondary | ICD-10-CM | POA: Diagnosis not present

## 2017-02-28 NOTE — Progress Notes (Signed)
In accordance with CMS guidelines, patient has met eligibility criteria including age, absence of signs or symptoms of lung cancer.  Social History   Tobacco Use  . Smoking status: Current Every Day Smoker    Packs/day: 1.50    Years: 34.00    Pack years: 51.00    Types: Cigarettes    Last attempt to quit: 03/18/2009    Years since quitting: 7.9  . Smokeless tobacco: Never Used  Substance Use Topics  . Alcohol use: Yes    Alcohol/week: 0.0 oz    Comment: occasional use  . Drug use: No     A shared decision-making session was conducted prior to the performance of CT scan. This includes one or more decision aids, includes benefits and harms of screening, follow-up diagnostic testing, over-diagnosis, false positive rate, and total radiation exposure.  Counseling on the importance of adherence to annual lung cancer LDCT screening, impact of co-morbidities, and ability or willingness to undergo diagnosis and treatment is imperative for compliance of the program.  Counseling on the importance of continued smoking cessation for former smokers; the importance of smoking cessation for current smokers, and information about tobacco cessation interventions have been given to patient including [place] and 1800 quit [place] programs.  Written order for lung cancer screening with LDCT has been given to the patient and any and all questions have been answered to the best of my abilities.   Yearly follow up will be coordinated by Burgess Estelle, Thoracic Navigator.  Beckey Rutter, DNP, AGNP-C 02/28/17 11:37 AM

## 2017-02-28 NOTE — Telephone Encounter (Signed)
Lamuel w/ [facility] wanted to let Dr. Caryn Section know that he had a missed visit with pt today b/c pt stated she has 2 doctor appts today and didn't wish to do physical therapy after. Lamuel stated he plans to continue therapy with pt next week. Thanks TNP

## 2017-02-28 NOTE — Telephone Encounter (Signed)
FYI

## 2017-03-03 ENCOUNTER — Encounter: Payer: Self-pay | Admitting: *Deleted

## 2017-03-03 ENCOUNTER — Encounter: Payer: Self-pay | Admitting: Family Medicine

## 2017-03-03 ENCOUNTER — Other Ambulatory Visit: Payer: Self-pay | Admitting: Family Medicine

## 2017-03-03 DIAGNOSIS — M5137 Other intervertebral disc degeneration, lumbosacral region: Secondary | ICD-10-CM

## 2017-03-03 DIAGNOSIS — I700 Atherosclerosis of aorta: Secondary | ICD-10-CM | POA: Insufficient documentation

## 2017-03-03 NOTE — Telephone Encounter (Signed)
Please advise? It looks like pt did not get the [place] ordered done.

## 2017-03-03 NOTE — Telephone Encounter (Signed)
Pt called saying she had an abd [place] on Nov 29th and has not heard anything back  Her call back is [phone]  Thanks teri

## 2017-03-04 NOTE — Telephone Encounter (Signed)
She can have 2 sample symbicort. What pharmacy does she want hydrocodone/apap sent to.

## 2017-03-04 NOTE — Telephone Encounter (Signed)
Advised patient as below. Patient is requesting samples of symbicort and to have the hydrocodone refilled. She reports that she will come by tomorrow morning to have labs done. She has been forgetting to fast for her bloodwork.

## 2017-03-04 NOTE — Telephone Encounter (Signed)
Ultrasound shows fatty liver. This is treated with low carbohydrate diet and weight loss. Need results of labs ordered at o.v. To see how liver functions look.

## 2017-03-05 ENCOUNTER — Telehealth: Payer: Self-pay | Admitting: Family Medicine

## 2017-03-05 NOTE — Telephone Encounter (Signed)
LMTCB Samples placed up front, need to know about pharmacy choice-Misty Bishop, RMA

## 2017-03-05 NOTE — Telephone Encounter (Signed)
[name] with Misty Bishop reported another missed visit for physical therapy.  Pt also states there has been a death in her family and did not want to be seen today.  WC#[phone]/[place]

## 2017-03-07 DIAGNOSIS — M797 Fibromyalgia: Secondary | ICD-10-CM | POA: Diagnosis not present

## 2017-03-07 DIAGNOSIS — M479 Spondylosis, unspecified: Secondary | ICD-10-CM | POA: Diagnosis not present

## 2017-03-07 DIAGNOSIS — M24512 Contracture, left shoulder: Secondary | ICD-10-CM | POA: Diagnosis not present

## 2017-03-07 DIAGNOSIS — G8929 Other chronic pain: Secondary | ICD-10-CM | POA: Diagnosis not present

## 2017-03-07 DIAGNOSIS — M25561 Pain in right knee: Secondary | ICD-10-CM | POA: Diagnosis not present

## 2017-03-07 DIAGNOSIS — R5382 Chronic fatigue, unspecified: Secondary | ICD-10-CM

## 2017-03-07 DIAGNOSIS — J449 Chronic obstructive pulmonary disease, unspecified: Secondary | ICD-10-CM | POA: Diagnosis not present

## 2017-03-07 DIAGNOSIS — M170 Bilateral primary osteoarthritis of knee: Secondary | ICD-10-CM | POA: Diagnosis not present

## 2017-03-07 DIAGNOSIS — M24511 Contracture, right shoulder: Secondary | ICD-10-CM | POA: Diagnosis not present

## 2017-03-07 NOTE — Telephone Encounter (Signed)
Left message to call back  

## 2017-03-10 ENCOUNTER — Telehealth: Payer: Self-pay | Admitting: Family Medicine

## 2017-03-10 ENCOUNTER — Ambulatory Visit: Payer: Medicare HMO | Admitting: Family Medicine

## 2017-03-10 MED ORDER — HYDROCODONE-ACETAMINOPHEN 5-325 MG PO TABS: 1.0000 | ORAL_TABLET | Freq: Four times a day (QID) | ORAL | 0 refills | Status: DC | PRN

## 2017-03-10 MED ORDER — NITROFURANTOIN MONOHYD MACRO 100 MG PO CAPS: 100.0000 mg | ORAL_CAPSULE | Freq: Two times a day (BID) | ORAL | 0 refills | Status: AC

## 2017-03-10 NOTE — Telephone Encounter (Signed)
Pt called to see if Rx for HYDROcodone-acetaminophen (NORCO/VICODIN) 5-325 MG tablet was sent to [facility]. Pt is requesting it be sent today if possible. Please advise. Thanks TNP

## 2017-03-10 NOTE — Telephone Encounter (Signed)
Please address during office visit today, thank [name], RMA

## 2017-03-10 NOTE — Telephone Encounter (Signed)
Pt is schedule for OV today @ 945 am for possible UTI. Pt requested an Rx be sent to pharmacy. I advised pt we don't treat over phone. Pt scheduled OV and request Dr. Maralyn Sago nurse return her call before the appt. Please advise. Thanks TNP

## 2017-03-10 NOTE — Telephone Encounter (Signed)
[facility] 

## 2017-03-10 NOTE — Telephone Encounter (Signed)
Patient reports that it is very hard for her to come in because she is not feeling well. She states that she knows that she has a UTI because she has this often. She is having urgency, pain on urination, and some mild vaginal itching. She has had symptoms X 3 days. She has been using AZO OTC for her symptoms without relief. She has also been using Monistat vaginal cream to help with the itching and reports that it has helped some. Patient is requesting that we send in something into the pharmacy to help with her symptoms. [facility] uses [facility] in [place]. Thanks!

## 2017-03-10 NOTE — Telephone Encounter (Signed)
Patient would like this sent into mail order pharmacy.

## 2017-03-10 NOTE — Telephone Encounter (Signed)
Tried calling patient and no answer. Mailbox was full and was unable to leave a message.

## 2017-03-13 ENCOUNTER — Telehealth: Payer: Self-pay | Admitting: Family Medicine

## 2017-03-13 ENCOUNTER — Other Ambulatory Visit: Payer: Self-pay | Admitting: Family Medicine

## 2017-03-13 DIAGNOSIS — M5137 Other intervertebral disc degeneration, lumbosacral region: Secondary | ICD-10-CM

## 2017-03-13 MED ORDER — HYDROCODONE-ACETAMINOPHEN 5-325 MG PO TABS: 1.0000 | ORAL_TABLET | Freq: Four times a day (QID) | ORAL | 0 refills | Status: DC | PRN

## 2017-03-13 NOTE — Telephone Encounter (Signed)
Misty Bishop called to let you know patient missed appointment for PT on 03/13/2017 because patient states that she is too busy.  Physical therapist states that he tried again today and she was too busy and patient statesd she might be available to do it tomorrow.

## 2017-03-13 NOTE — Telephone Encounter (Signed)
Please advise 

## 2017-03-13 NOTE — Telephone Encounter (Signed)
Hydrocodone 5-325 because the patient states [employer] said they do not have the RX in and it would be 2 wks before they are able to send it.  Pt is requesting Hydrocodone be sent to [facility] in [place] for a 1 month until [facility] is able to take over.    States if you have any questions please call her.

## 2017-03-13 NOTE — Telephone Encounter (Addendum)
FYI

## 2017-03-13 NOTE — Telephone Encounter (Signed)
Pt needs reill of [facility] to [facility] in [place],  She is having problems with the mail order pharmacy.  Pt's call back is [phone]  Thanks teri

## 2017-03-14 ENCOUNTER — Telehealth: Payer: Self-pay | Admitting: Family Medicine

## 2017-03-14 DIAGNOSIS — M170 Bilateral primary osteoarthritis of knee: Secondary | ICD-10-CM | POA: Diagnosis not present

## 2017-03-14 DIAGNOSIS — M25561 Pain in right knee: Secondary | ICD-10-CM | POA: Diagnosis not present

## 2017-03-14 DIAGNOSIS — M797 Fibromyalgia: Secondary | ICD-10-CM | POA: Diagnosis not present

## 2017-03-14 DIAGNOSIS — J449 Chronic obstructive pulmonary disease, unspecified: Secondary | ICD-10-CM | POA: Diagnosis not present

## 2017-03-14 DIAGNOSIS — G8929 Other chronic pain: Secondary | ICD-10-CM | POA: Diagnosis not present

## 2017-03-14 DIAGNOSIS — M479 Spondylosis, unspecified: Secondary | ICD-10-CM | POA: Diagnosis not present

## 2017-03-14 NOTE — Telephone Encounter (Signed)
Please advise 

## 2017-03-14 NOTE — Telephone Encounter (Signed)
Wanting to get a verbal for home visit for one visit a week for 2 weeks starting 03/18/17

## 2017-03-17 ENCOUNTER — Telehealth: Payer: Self-pay | Admitting: Family Medicine

## 2017-03-17 NOTE — Telephone Encounter (Signed)
Misty Bishop wants orders to have her home health extended due to patient missing a few visits. He wants to go once a week for 2 more weeks.   Her last visit was 03/14/17.

## 2017-03-19 ENCOUNTER — Telehealth: Payer: Self-pay

## 2017-03-19 NOTE — Telephone Encounter (Signed)
[employer] as below.

## 2017-03-19 NOTE — Telephone Encounter (Signed)
Please advise. Thanks.  

## 2017-03-19 NOTE — Telephone Encounter (Signed)
Because we just sent a prescription to her mail order. This prescription was just a temporary supply to get by until mail order arrives.

## 2017-03-19 NOTE — Telephone Encounter (Signed)
Patient is requesting a call back regarding Hydrocodone RX. Patient wanted to know why she only received a 12 day supply #90 at [facility]. CB# [phone]

## 2017-03-19 NOTE — Telephone Encounter (Signed)
That's fine

## 2017-03-20 NOTE — Telephone Encounter (Signed)
Patient stated that [facility] told her that they have rx on hold for Hydrocodone. Patient states [employer] told her they need a paper script the first time they fill Hydrodone for her. Per [employer] rx for Hydrocodone needs to be resent. Rx shows as canceled in their system.

## 2017-03-21 ENCOUNTER — Telehealth: Payer: Self-pay

## 2017-03-21 ENCOUNTER — Telehealth: Payer: Self-pay | Admitting: Family Medicine

## 2017-03-21 DIAGNOSIS — M5137 Other intervertebral disc degeneration, lumbosacral region: Secondary | ICD-10-CM

## 2017-03-21 NOTE — Telephone Encounter (Signed)
[name] home [facility], called stating that patient did not feel well earlier this week and declined therapy and told lemual to try Friday (today) lemuel called and went by patient's home today and no one answered. Misty Bishop needs a verbal ok to start patient's therapy next week instead. His CB is [phone]-Misty Bishop, RMA

## 2017-03-21 NOTE — Telephone Encounter (Signed)
[name] informed.

## 2017-03-21 NOTE — Telephone Encounter (Signed)
That's fine

## 2017-03-21 NOTE — Telephone Encounter (Signed)
RX for Hydrocodone has been deactivated per Judson Roch w/ [facility] and a new one needs to be sent for 30 days only.  Her ins. Only covers 30 days at a time.     Also needs RX for: Accucheck Nano Meter Smartview Test Strips Fast Click Lancets

## 2017-03-24 DIAGNOSIS — M797 Fibromyalgia: Secondary | ICD-10-CM | POA: Diagnosis not present

## 2017-03-24 DIAGNOSIS — G8929 Other chronic pain: Secondary | ICD-10-CM | POA: Diagnosis not present

## 2017-03-24 DIAGNOSIS — M170 Bilateral primary osteoarthritis of knee: Secondary | ICD-10-CM | POA: Diagnosis not present

## 2017-03-24 DIAGNOSIS — M479 Spondylosis, unspecified: Secondary | ICD-10-CM | POA: Diagnosis not present

## 2017-03-24 DIAGNOSIS — G894 Chronic pain syndrome: Secondary | ICD-10-CM | POA: Diagnosis not present

## 2017-03-24 DIAGNOSIS — J449 Chronic obstructive pulmonary disease, unspecified: Secondary | ICD-10-CM | POA: Diagnosis not present

## 2017-03-24 DIAGNOSIS — M24511 Contracture, right shoulder: Secondary | ICD-10-CM | POA: Diagnosis not present

## 2017-03-24 DIAGNOSIS — M25561 Pain in right knee: Secondary | ICD-10-CM | POA: Diagnosis not present

## 2017-03-24 DIAGNOSIS — R5382 Chronic fatigue, unspecified: Secondary | ICD-10-CM | POA: Diagnosis not present

## 2017-03-24 DIAGNOSIS — M24512 Contracture, left shoulder: Secondary | ICD-10-CM | POA: Diagnosis not present

## 2017-03-24 MED ORDER — HYDROCODONE-ACETAMINOPHEN 5-325 MG PO TABS: 1.0000 | ORAL_TABLET | Freq: Four times a day (QID) | ORAL | 0 refills | Status: DC | PRN

## 2017-03-24 NOTE — Telephone Encounter (Signed)
Pt called back and stated that [facility] no longer excepts 90 supply of controlled medications.  The prescribed have to be e-prescribed for 30 days at a time.  Please send in a 30 day supply of Hydrocodone to [facility].  Call pt when completed [phone].  Thanks,   -Mickel Baas

## 2017-03-25 NOTE — Telephone Encounter (Signed)
See follow up telephone message 03-21-2016

## 2017-03-27 ENCOUNTER — Telehealth: Payer: Self-pay | Admitting: Family Medicine

## 2017-03-27 ENCOUNTER — Telehealth: Payer: Self-pay

## 2017-03-27 DIAGNOSIS — M5137 Other intervertebral disc degeneration, lumbosacral region: Secondary | ICD-10-CM

## 2017-03-27 MED ORDER — HYDROCODONE-ACETAMINOPHEN 5-325 MG PO TABS: 1.0000 | ORAL_TABLET | Freq: Four times a day (QID) | ORAL | 0 refills | Status: DC | PRN

## 2017-03-27 NOTE — Telephone Encounter (Signed)
Left detailed message on Misty Bishop's vm as requested.

## 2017-03-27 NOTE — Telephone Encounter (Signed)
Supposed to be 1-2 tablets every six hours as needed.

## 2017-03-27 NOTE — Telephone Encounter (Signed)
Clarification given. 

## 2017-03-27 NOTE — Telephone Encounter (Signed)
Clarification on the Hyrodcodone - states there are two sets of directions (need which one to go by)  And need to know max tablet to go by.    States they just got refilled at retail pharmacy 03/14/17  [facility]- [phone]

## 2017-03-27 NOTE — Telephone Encounter (Signed)
Sarah from [facility] called to verify the directions on pt's Hydrocodone/Acetaminophen.  She states it has two different directions on the prescription.    Contact number is [phone] Ext [phone]  Judson Roch states it is okay to leave a message on her voice mail.  Just leave your first and last name for documentation purposes.   Thanks,   -Mickel Baas

## 2017-03-27 NOTE — Telephone Encounter (Signed)
Please advise 

## 2017-03-28 DIAGNOSIS — M25561 Pain in right knee: Secondary | ICD-10-CM | POA: Diagnosis not present

## 2017-03-28 DIAGNOSIS — M797 Fibromyalgia: Secondary | ICD-10-CM | POA: Diagnosis not present

## 2017-03-28 DIAGNOSIS — M170 Bilateral primary osteoarthritis of knee: Secondary | ICD-10-CM | POA: Diagnosis not present

## 2017-03-28 DIAGNOSIS — J449 Chronic obstructive pulmonary disease, unspecified: Secondary | ICD-10-CM | POA: Diagnosis not present

## 2017-03-28 DIAGNOSIS — M479 Spondylosis, unspecified: Secondary | ICD-10-CM | POA: Diagnosis not present

## 2017-03-28 DIAGNOSIS — G8929 Other chronic pain: Secondary | ICD-10-CM | POA: Diagnosis not present

## 2017-04-02 ENCOUNTER — Telehealth: Payer: Self-pay | Admitting: Family Medicine

## 2017-04-02 DIAGNOSIS — M479 Spondylosis, unspecified: Secondary | ICD-10-CM | POA: Diagnosis not present

## 2017-04-02 DIAGNOSIS — M170 Bilateral primary osteoarthritis of knee: Secondary | ICD-10-CM | POA: Diagnosis not present

## 2017-04-02 DIAGNOSIS — G8929 Other chronic pain: Secondary | ICD-10-CM | POA: Diagnosis not present

## 2017-04-02 DIAGNOSIS — J449 Chronic obstructive pulmonary disease, unspecified: Secondary | ICD-10-CM | POA: Diagnosis not present

## 2017-04-02 DIAGNOSIS — M797 Fibromyalgia: Secondary | ICD-10-CM | POA: Diagnosis not present

## 2017-04-02 DIAGNOSIS — M25561 Pain in right knee: Secondary | ICD-10-CM | POA: Diagnosis not present

## 2017-04-02 NOTE — Telephone Encounter (Signed)
Please advise 

## 2017-04-02 NOTE — Telephone Encounter (Signed)
[name] with [facility] called to advise that today is his last home visit with pt for physical therapy. Skeet Simmer stated pt no longer requires in home PT but pt has requested to continue PT. Skeet Simmer stated he feels pt can do out patient PT and is requesting orders for out pt PT be sent to [facility] in [place]. [facility] Fax # [phone] Please advise. Thanks TNP

## 2017-04-03 DIAGNOSIS — M25561 Pain in right knee: Secondary | ICD-10-CM | POA: Diagnosis not present

## 2017-04-03 DIAGNOSIS — M24511 Contracture, right shoulder: Secondary | ICD-10-CM | POA: Diagnosis not present

## 2017-04-03 DIAGNOSIS — M479 Spondylosis, unspecified: Secondary | ICD-10-CM | POA: Diagnosis not present

## 2017-04-03 DIAGNOSIS — M797 Fibromyalgia: Secondary | ICD-10-CM | POA: Diagnosis not present

## 2017-04-03 DIAGNOSIS — G8929 Other chronic pain: Secondary | ICD-10-CM | POA: Diagnosis not present

## 2017-04-03 DIAGNOSIS — M170 Bilateral primary osteoarthritis of knee: Secondary | ICD-10-CM | POA: Diagnosis not present

## 2017-04-03 DIAGNOSIS — J449 Chronic obstructive pulmonary disease, unspecified: Secondary | ICD-10-CM | POA: Diagnosis not present

## 2017-04-03 DIAGNOSIS — M24512 Contracture, left shoulder: Secondary | ICD-10-CM | POA: Diagnosis not present

## 2017-04-03 NOTE — Telephone Encounter (Signed)
Can you please refer to [facility] physical therapy for LE weakness and balance problems.

## 2017-04-08 DIAGNOSIS — J449 Chronic obstructive pulmonary disease, unspecified: Secondary | ICD-10-CM | POA: Diagnosis not present

## 2017-04-08 DIAGNOSIS — G8929 Other chronic pain: Secondary | ICD-10-CM | POA: Diagnosis not present

## 2017-04-08 DIAGNOSIS — M24512 Contracture, left shoulder: Secondary | ICD-10-CM | POA: Diagnosis not present

## 2017-04-08 DIAGNOSIS — M25561 Pain in right knee: Secondary | ICD-10-CM | POA: Diagnosis not present

## 2017-04-08 DIAGNOSIS — M170 Bilateral primary osteoarthritis of knee: Secondary | ICD-10-CM | POA: Diagnosis not present

## 2017-04-08 DIAGNOSIS — M479 Spondylosis, unspecified: Secondary | ICD-10-CM | POA: Diagnosis not present

## 2017-04-08 DIAGNOSIS — M24511 Contracture, right shoulder: Secondary | ICD-10-CM | POA: Diagnosis not present

## 2017-04-08 DIAGNOSIS — R5382 Chronic fatigue, unspecified: Secondary | ICD-10-CM | POA: Diagnosis not present

## 2017-04-09 NOTE — Telephone Encounter (Signed)
Appointment scheduled at [facility] physical therapy 04/16/17 at 10:30.Pt advised

## 2017-05-20 ENCOUNTER — Other Ambulatory Visit: Payer: Self-pay | Admitting: Family Medicine

## 2017-05-20 DIAGNOSIS — M5137 Other intervertebral disc degeneration, lumbosacral region: Secondary | ICD-10-CM

## 2017-05-20 MED ORDER — HYDROCODONE-ACETAMINOPHEN 5-325 MG PO TABS: 1.0000 | ORAL_TABLET | Freq: Four times a day (QID) | ORAL | 0 refills | Status: DC | PRN

## 2017-05-20 NOTE — Addendum Note (Signed)
Addended by: Birdie Sons on: 05/20/2017 05:40 PM   Modules accepted: Orders

## 2017-05-20 NOTE — Telephone Encounter (Signed)
Pt contacted office for refill request on the following medications:  HYDROcodone-acetaminophen (NORCO/VICODIN) 5-325 MG tablet   [facility] [place]  Last Rx: 03/24/17 LOV: 02/07/17 Please advise. Thanks TNP

## 2017-06-04 ENCOUNTER — Encounter: Payer: Self-pay | Admitting: Family Medicine

## 2017-06-05 NOTE — Telephone Encounter (Signed)
Patient is calling to see if this has been completed.  Please let her know.

## 2017-06-06 NOTE — Telephone Encounter (Signed)
Patient reports that she needs the form faxed by today in order to start work on Monday.

## 2017-06-23 ENCOUNTER — Telehealth: Payer: Self-pay

## 2017-06-23 NOTE — Telephone Encounter (Signed)
Please advise? There are no recent PT orders in epic.

## 2017-06-23 NOTE — Telephone Encounter (Signed)
Pt calling stating she just received a call from [facility] about home Physical Therapy.  She says they received a referral from Dr. Caryn Section that is she needing home PT again.  She did not know there was an order placed.  She is wanting to know if Dr. Caryn Section signed an order recently.    Contact Number: [phone]   Thanks,   -Mickel Baas

## 2017-06-23 NOTE — Telephone Encounter (Signed)
Not lately, I thinks it was December or January that PT was ordered.

## 2017-06-24 NOTE — Telephone Encounter (Signed)
LMOVM for pt to return call 

## 2017-06-26 NOTE — Telephone Encounter (Signed)
LMOVM for pt to return call 

## 2017-06-27 NOTE — Telephone Encounter (Signed)
Sent message via Mychart to advise patient.

## 2017-07-06 ENCOUNTER — Encounter: Payer: Self-pay | Admitting: Emergency Medicine

## 2017-07-06 ENCOUNTER — Emergency Department
Admission: EM | Admit: 2017-07-06 | Discharge: 2017-07-06 | Disposition: A | Discharge status: Home / Self Care | Payer: Medicare HMO | Attending: Emergency Medicine | Admitting: Emergency Medicine

## 2017-07-06 ENCOUNTER — Other Ambulatory Visit: Payer: Self-pay

## 2017-07-06 ENCOUNTER — Emergency Department: Payer: Medicare HMO

## 2017-07-06 DIAGNOSIS — Z7982 Long term (current) use of aspirin: Secondary | ICD-10-CM | POA: Insufficient documentation

## 2017-07-06 DIAGNOSIS — J449 Chronic obstructive pulmonary disease, unspecified: Secondary | ICD-10-CM | POA: Insufficient documentation

## 2017-07-06 DIAGNOSIS — Z79899 Other long term (current) drug therapy: Secondary | ICD-10-CM | POA: Diagnosis not present

## 2017-07-06 DIAGNOSIS — Z7984 Long term (current) use of oral hypoglycemic drugs: Secondary | ICD-10-CM | POA: Diagnosis not present

## 2017-07-06 DIAGNOSIS — E119 Type 2 diabetes mellitus without complications: Secondary | ICD-10-CM | POA: Insufficient documentation

## 2017-07-06 DIAGNOSIS — R0789 Other chest pain: Secondary | ICD-10-CM

## 2017-07-06 DIAGNOSIS — R0602 Shortness of breath: Secondary | ICD-10-CM | POA: Diagnosis not present

## 2017-07-06 DIAGNOSIS — F17210 Nicotine dependence, cigarettes, uncomplicated: Secondary | ICD-10-CM | POA: Diagnosis not present

## 2017-07-06 DIAGNOSIS — I10 Essential (primary) hypertension: Secondary | ICD-10-CM | POA: Insufficient documentation

## 2017-07-06 DIAGNOSIS — R079 Chest pain, unspecified: Secondary | ICD-10-CM | POA: Diagnosis not present

## 2017-07-06 HISTORY — DX: Type 2 diabetes mellitus without complications: E11.9

## 2017-07-06 HISTORY — DX: Unspecified chronic gastritis with bleeding: K29.51

## 2017-07-06 LAB — CBC
HCT: 43.8 % (ref 35.0–47.0)
HEMOGLOBIN: 15.7 g/dL (ref 12.0–16.0)
MCH: 32.6 pg (ref 26.0–34.0)
MCHC: 35.9 g/dL (ref 32.0–36.0)
MCV: 90.8 fL (ref 80.0–100.0)
Platelets: 213 10*3/uL (ref 150–440)
RBC: 4.83 MIL/uL (ref 3.80–5.20)
RDW: 12.2 % (ref 11.5–14.5)
WBC: 8 10*3/uL (ref 3.6–11.0)

## 2017-07-06 LAB — BASIC METABOLIC PANEL
ANION GAP: 8 (ref 5–15)
BUN: 13 mg/dL (ref 6–20)
CALCIUM: 9 mg/dL (ref 8.9–10.3)
CO2: 25 mmol/L (ref 22–32)
Chloride: 102 mmol/L (ref 101–111)
Creatinine, Ser: 0.77 mg/dL (ref 0.44–1.00)
GFR calc Af Amer: >60 mL/min (ref >60)
GFR calc non Af Amer: >60 mL/min (ref >60)
Glucose, Bld: 255 mg/dL — ABNORMAL HIGH (ref 65–99)
POTASSIUM: 3.9 mmol/L (ref 3.5–5.1)
SODIUM: 135 mmol/L (ref 135–145)

## 2017-07-06 LAB — TROPONIN I

## 2017-07-06 MED ORDER — GI COCKTAIL ~~LOC~~
30.0000 mL | Freq: Once | ORAL | Status: AC
  Administered 2017-07-06: 30 mL via ORAL
  Filled 2017-07-06: qty 30

## 2017-07-06 MED ORDER — SUCRALFATE 1 G PO TABS: 1.0000 g | ORAL_TABLET | Freq: Four times a day (QID) | ORAL | 0 refills | Status: DC

## 2017-07-06 NOTE — ED Provider Notes (Signed)
[facility] Emergency Department Provider Note  ____________________________________________   I have reviewed the triage vital signs and the nursing notes.   HISTORY  Chief Complaint Chest Pain and Shortness of Breath   History limited by: Not Limited   HPI Misty Bishop is a 62 y.o. female who presents to the emergency department today because of concerns for chest pain and shortness of breath.  She describes the chest pain is located in the left upper chest.  She states it has been on and off for months. It has been accompanied by shortness of breath. Worse with exertion. It is also worse with palpation. The patient also has complaints of right lower chest pain. She denies any associated fever. No nausea or vomiting.    Per medical record review patient has a history of peptic ulcer disease, DM.  Past Medical History:  Diagnosis Date  . Bipolar disorder ([place])   . Chronic gastritis with bleeding   . Depression   . Diabetes mellitus without complication ([place])   . Headache(784.0)   . Hypertension   . Palpitations    Holter monitor in 2008 showed PVCs  . Peptic ulcer disease   . PTSD (post-traumatic stress disorder)     Patient Active Problem List   Diagnosis Date Noted  . Aortic atherosclerosis ([place]) 03/03/2017  . Chronic fatigue syndrome 11/30/2015  . Hyperlipemia   . Bipolar disorder ([place])   . Anxiety 11/08/2014  . Ache in joint 11/08/2014  . Back ache 11/08/2014  . COPD (chronic obstructive pulmonary disease) ([place]) 11/08/2014  . Degeneration of lumbar or lumbosacral intervertebral disc 11/08/2014  . Diabetes mellitus, type 2 ([place]) 11/08/2014  . Difficulty hearing 11/08/2014  . Cold sore 11/08/2014  . Personal history of reproductive and obstetrical problems 11/08/2014  . Symptomatic menopausal or female climacteric states 11/08/2014  . Sciatica 11/08/2014  . Chronic sinusitis 11/08/2014  . Non-alcoholic fatty liver disease  11/08/2014  . Smoking greater than 30 pack years 11/08/2014  . Tremor 11/08/2014  . Achrochordon 11/08/2014  . Cutaneous skin tags 11/08/2014  . GERD (gastroesophageal reflux disease) 08/19/2014  . ANA positive 12/29/2013  . Arthritis, degenerative 12/29/2013  . CHEST PAIN-UNSPECIFIED 07/13/2009  . SHORTNESS OF BREATH 07/11/2009  . LBP (low back pain) 03/01/2007  . Allergic rhinitis 01/14/2007  . Chronic pain syndrome 01/14/2007  . Clinical depression 01/14/2007  . H/O peptic ulcer 01/14/2007  . Benign essential HTN 01/14/2007  . Meniere's disease 01/14/2007    Past Surgical History:  Procedure Laterality Date  . BREAST BIOPSY     Left, benign  . CESAREAN SECTION    . KNEE SURGERY     Left  . SHOULDER SURGERY     Left  . TONSILLECTOMY      Prior to Admission medications   Medication Sig Start Date End Date Taking? Authorizing Provider  albuterol (VENTOLIN HFA) 108 (90 Base) MCG/ACT inhaler Inhale 2 puffs into the lungs every 6 (six) hours as needed. 05/15/16   Birdie Sons, MD  aspirin 81 MG EC tablet Take 81 mg by mouth daily.      [provider]  b complex vitamins capsule Take 1 capsule by mouth Once PRN.      [provider]  budesonide-formoterol (SYMBICORT) 160-4.5 MCG/ACT inhaler Inhale 2 puffs into the lungs 2 (two) times daily. 02/11/17   Birdie Sons, MD  cefdinir (OMNICEF) 300 MG capsule Take 1 capsule (300 mg total) by mouth 2 (two) times daily.  02/07/17   Birdie Sons, MD  clonazePAM (KLONOPIN) 1 MG tablet Take by mouth. Take 1-2 tablets daily and 2 tablets at bedtime.     [provider]  dexlansoprazole (DEXILANT) 60 MG capsule Take 1 capsule (60 mg total) daily by mouth. 01/21/17   Birdie Sons, MD  diclofenac (VOLTAREN) 75 MG EC tablet Take 1 tablet (75 mg total) by mouth 2 (two) times daily as needed. 02/07/17   Birdie Sons, MD  diclofenac sodium (VOLTAREN) 1 % GEL APPLY TO AFFECTED AREA FOUR TIMES A DAY AS  NEEDED 07/25/16   Birdie Sons, MD  escitalopram (LEXAPRO) 20 MG tablet Take 1 tablet (20 mg total) by mouth daily. Take 1/2 tablet daily for six days, then increase to one full tablet daily 09/04/16   Birdie Sons, MD  fluticasone [facility]) 50 MCG/ACT nasal spray Place 2 sprays into both nostrils daily. 07/25/16   Birdie Sons, MD  glucose blood (ACCU-CHEK AVIVA PLUS) test strip Use to check blood sugar once a day. E11.9 07/06/15   Birdie Sons, MD  loratadine (CLARITIN) 10 MG tablet Take 1 tablet by mouth daily. 09/16/12   [provider]  losartan-hydrochlorothiazide (HYZAAR) 50-12.5 MG tablet Take 1 tablet by mouth daily. 07/25/16   Birdie Sons, MD  metFORMIN (GLUCOPHAGE-XR) 500 MG 24 hr tablet Take 2 tablets (1,000 mg total) by mouth daily with breakfast. 09/23/16   Birdie Sons, MD  montelukast (SINGULAIR) 10 MG tablet Take 1 tablet (10 mg total) by mouth daily. 07/25/16   Birdie Sons, MD  Omega 3-6-9 Fatty Acids (OMEGA 3-6-9 COMPLEX) CAPS Take 1 capsule by mouth daily.    [provider]  Probiotic Product (PROBIOTIC PO) Take 3 tablets by mouth daily.    [provider]  ranitidine (ZANTAC) 150 MG capsule Take 1 capsule (150 mg total) 2 (two) times daily by mouth. 01/21/17   Birdie Sons, MD  simvastatin (ZOCOR) 20 MG tablet Take 1 tablet (20 mg total) by mouth at bedtime. 09/04/16   Birdie Sons, MD  traMADol-acetaminophen (ULTRACET) 37.5-325 MG tablet Take 1 tablet by mouth every 6 (six) hours as needed. 02/07/17   Birdie Sons, MD  valACYclovir (VALTREX) 1000 MG tablet Take 1 tablet by mouth. Every 8 hours for seven days as needed 03/16/14   [provider]  vitamin C (ASCORBIC ACID) 250 MG tablet Take 4 tablets by mouth daily.    [provider]    Allergies Levofloxacin; Levaquin  [levofloxacin in d5w]; Penicillins; and Sulfa antibiotics  Family History  Problem Relation Age of Onset  . Heart attack Mother 16        MI  . Cancer Father        skin with mets to prostate and brain  . COPD Father   . Heart disease Sister        Palpitations  . Diabetes Sister   . Obesity Sister   . Cancer Maternal Aunt        Leukemia  . Schizophrenia Sister   . COPD Sister     Social History Social History   Tobacco Use  . Smoking status: Current Every Day Smoker    Packs/day: 1.50    Years: 34.00    Pack years: 51.00    Types: Cigarettes    Last attempt to quit: 03/18/2009    Years since quitting: 8.3  . Smokeless tobacco: Never Used  Substance Use Topics  .  Alcohol use: Yes    Alcohol/week: 0.0 oz    Comment: occasional use  . Drug use: No    Review of Systems Constitutional: No fever/chills Eyes: No visual changes. ENT: No sore throat. Cardiovascular: Positive for chest pain. Respiratory: Positive for shortness of breath.  Gastrointestinal: No abdominal pain.  No nausea, no vomiting.  No diarrhea.   Genitourinary: Negative for dysuria. Musculoskeletal: Negative for back pain. Skin: Negative for rash. Neurological: Negative for headaches, focal weakness or numbness.  ____________________________________________   PHYSICAL EXAM:  VITAL SIGNS: ED Triage Vitals  Enc Vitals Group     BP 07/06/17 1214 (!) 172/84     Pulse Rate 07/06/17 1214 64     Resp 07/06/17 1214 18     Temp 07/06/17 1214 98.2 F (36.8 C)     Temp Source 07/06/17 1214 Oral     SpO2 07/06/17 1214 98 %     Weight 07/06/17 1210 247 lb (112 kg)     Height 07/06/17 1210 5\' 7"  (1.702 m)     Head Circumference --      Peak Flow --      Pain Score 07/06/17 1210 5   Constitutional: Alert and oriented. Well appearing and in no distress. Eyes: Conjunctivae are normal.  ENT   Head: Normocephalic and atraumatic.   Nose: No congestion/rhinnorhea.   Mouth/Throat: Mucous membranes are moist.   Neck: No stridor. Hematological/Lymphatic/Immunilogical: No cervical lymphadenopathy. Cardiovascular: Normal rate,  regular rhythm.  No murmurs, rubs, or gallops.  Respiratory: Normal respiratory effort without tachypnea nor retractions. Breath sounds are clear and equal bilaterally. No wheezes/rales/rhonchi. Gastrointestinal: Soft and non tender. No rebound. No guarding.  Genitourinary: Deferred Musculoskeletal: Normal range of motion in all extremities. No lower extremity edema. Neurologic:  Normal speech and language. No gross focal neurologic deficits are appreciated.  Skin:  Skin is warm, dry and intact. No rash noted. Psychiatric: Mood and affect are normal. Speech and behavior are normal. Patient exhibits appropriate insight and judgment.  ____________________________________________    LABS (pertinent positives/negatives)  Trop <0.03 CBC wnl BMP wnl except glu 255  ____________________________________________   EKG  I, Nance Pear, attending physician, personally viewed and interpreted this EKG  EKG Time: 1212 Rate: 74 Rhythm: normal sinus rhythm Axis: normal Intervals: qtc 461 QRS: narrow, q waves v1, v2 ST changes: no st elevation Impression: abnormal ekg   ____________________________________________    RADIOLOGY  CXR Emphysema  ____________________________________________   PROCEDURES  Procedures  ____________________________________________   INITIAL IMPRESSION / ASSESSMENT AND PLAN / ED COURSE  Pertinent labs & imaging results that were available during my care of the patient were reviewed by me and considered in my medical decision making (see chart for details).  Patient presented to the emergency department today with primary concerns for chest pain shortness of breath.  She states she has been having this chest pain on and off for months.  Differential would be broad including ACS, angina, costochondritis, pneumonia, bronchitis amongst other etiologies.  Patient work-up here without concerning findings.  She did feel better after GI cocktail  responsibilities esophagitis.  Will discharge patient home with sucralfate and discussed with patient importance of continuing her ranitidine.  ____________________________________________   FINAL CLINICAL IMPRESSION(S) / ED DIAGNOSES  Final diagnoses:  Atypical chest pain     Note: This dictation was prepared with Dragon dictation. Any transcriptional errors that result from this process are unintentional     Nance Pear, MD 07/06/17 1820

## 2017-07-06 NOTE — Discharge Instructions (Addendum)
Please seek medical attention for any high fevers, chest pain, shortness of breath, change in behavior, persistent vomiting, bloody stool or any other new or concerning symptoms.  

## 2017-07-06 NOTE — ED Notes (Signed)
Spoke with pt about wait times and what to expect next. Advised pt that I am available for further questions if needed.  

## 2017-07-06 NOTE — ED Triage Notes (Signed)
Pt presents to ED via POV with c/o chest pain and SHOB that has been intermittent for a few days. Pt states today sudden worsening chest pain in her central chest with L arm numbness.

## 2017-07-21 ENCOUNTER — Telehealth: Payer: Self-pay | Admitting: Family Medicine

## 2017-07-21 DIAGNOSIS — M5137 Other intervertebral disc degeneration, lumbosacral region: Secondary | ICD-10-CM

## 2017-07-21 DIAGNOSIS — E119 Type 2 diabetes mellitus without complications: Secondary | ICD-10-CM

## 2017-07-21 MED ORDER — METFORMIN HCL ER 500 MG PO TB24: 1000.0000 mg | ORAL_TABLET | Freq: Every day | ORAL | 0 refills | Status: DC

## 2017-07-21 MED ORDER — HYDROCODONE-ACETAMINOPHEN 5-325 MG PO TABS: 1.0000 | ORAL_TABLET | Freq: Four times a day (QID) | ORAL | 0 refills | Status: DC | PRN

## 2017-07-21 MED ORDER — METFORMIN HCL ER 500 MG PO TB24: 1000.0000 mg | ORAL_TABLET | Freq: Every day | ORAL | 3 refills | Status: DC

## 2017-07-21 NOTE — Telephone Encounter (Signed)
Pt contacted office for refill request on the following medications:  1. HYDROcodone-acetaminophen (NORCO/VICODIN) 5-325 MG tablet   [facility] [place]  2. metFORMIN (GLUCOPHAGE-XR) 500 MG 24 hr tablet  90 day supply to [employer] 15 day supply to [employer] (pt stated she didn't realize she is almost out and doesn't have refills with [employer])  3. Pt is requesting to get lab slip for the labs that were ordered at her CPE visit on 02/07/17. Pt request a call back when slip is ready for pick up.   LOV: 02/07/18 NOV: 07/23/17 Please advise. Thanks TNP

## 2017-07-21 NOTE — Telephone Encounter (Signed)
Please advise message below? Laps ordered in November are still active. Lab slip printed.

## 2017-07-22 ENCOUNTER — Telehealth: Payer: Self-pay | Admitting: Family Medicine

## 2017-07-22 DIAGNOSIS — E119 Type 2 diabetes mellitus without complications: Secondary | ICD-10-CM

## 2017-07-22 DIAGNOSIS — E785 Hyperlipidemia, unspecified: Secondary | ICD-10-CM

## 2017-07-22 NOTE — Telephone Encounter (Signed)
Pt called to see if the lab slip was ready (please telephone encounter from 07/21/17). The labs that were ordered on 02/07/17 were ordered for [place]. Since we now have [employer] in the office can the labs be reordered for [employer]? Please advise. Thanks TNP

## 2017-07-22 NOTE — Telephone Encounter (Signed)
Please advise 

## 2017-07-22 NOTE — Telephone Encounter (Signed)
Pt stated that it is difficult for her to afford budesonide-formoterol (SYMBICORT) 160-4.5 MCG/ACT inhaler and is asking if we have any samples in the office. If so, pt would like to pick it up at her OV on 07/24/17. Please advise. Thanks TNP

## 2017-07-22 NOTE — Telephone Encounter (Signed)
Do we have any?

## 2017-07-22 NOTE — Telephone Encounter (Signed)
Please reorder same labs as 02/07/2018.

## 2017-07-22 NOTE — Telephone Encounter (Signed)
Please advise? Is it okay to change to [facility]?

## 2017-07-22 NOTE — Telephone Encounter (Signed)
Labs reordered and printed.

## 2017-07-22 NOTE — Telephone Encounter (Signed)
Please advise sample?

## 2017-07-23 ENCOUNTER — Ambulatory Visit: Payer: Medicare HMO | Admitting: Family Medicine

## 2017-07-23 NOTE — Telephone Encounter (Signed)
Patient was advised.  

## 2017-07-23 NOTE — Telephone Encounter (Signed)
There are no samples at this time.

## 2017-07-24 ENCOUNTER — Ambulatory Visit: Payer: Medicare HMO | Admitting: Family Medicine

## 2017-07-24 NOTE — Progress Notes (Deleted)
Patient: Misty Bishop [facility]    DOB: 09/17/1955   62 y.o.   MRN: [id number] Visit Date: 07/24/2017  Today's Provider: Lelon Huh, MD   No chief complaint on file.  Subjective:    HPI   Diabetes Mellitus Type II, Follow-up:   Lab Results  Component Value Date   HGBA1C 8.8 09/04/2016   HGBA1C 7.5 05/15/2016   HGBA1C 6.8 04/18/2015   Last seen for diabetes 6 months ago.  Management since then includes; labs ordered, but zero were done. She reports {excellent/good/fair/poor:19665} compliance with treatment. She {ACTION; IS/IS BHA:19379024} having side effects. *** Current symptoms include {Symptoms; diabetes:14075} and have been {Desc; course:15616}. Home blood sugar records: {diabetes glucometry results:16657}  Episodes of hypoglycemia? {yes***/no:17258}   Current Insulin Regimen: *** Most Recent Eye Exam: *** Weight trend: {trend:16658} Prior visit with dietician: {yes/no:17258} Current diet: {diet habits:16563} Current exercise: {exercise types:16438}  ------------------------------------------------------------------------   Hypertension, follow-up:  BP Readings from Last 3 Encounters:  07/06/17 140/78  02/07/17 132/72  09/04/16 128/80    She was last seen for hypertension 6 months ago.  BP at that visit was 132/72. Management since that visit includes; no changes.She reports {excellent/good/fair/poor:19665} compliance with treatment. She {ACTION; IS/IS OXB:35329924} having side effects. *** She {is/is not:9024} exercising. She {is/is not:9024} adherent to low salt diet.   Outside blood pressures are ***. She is experiencing {Symptoms; cardiac:12860}.  Patient denies {Symptoms; cardiac:12860}.   Cardiovascular risk factors include {cv risk factors:510}.  Use of agents associated with hypertension: {bp agents assoc with hypertension:511::"none"}.    ------------------------------------------------------------------------    Lipid/Cholesterol, Follow-up:   Last seen for this 6 months ago.  Management since that visit includes; labs ordered, but zero were done.  Last Lipid Panel:    Component Value Date/Time   CHOL 185 06/09/2014   CHOL 183 09/02/2011 0208   TRIG 139 06/09/2014   TRIG 214 (H) 09/02/2011 0208   HDL 37 06/09/2014   HDL 55 09/02/2011 0208   VLDL 43 (H) 09/02/2011 0208   LDLCALC 120 06/09/2014   LDLCALC 85 09/02/2011 0208    She reports {excellent/good/fair/poor:19665} compliance with treatment. She {ACTION; IS/IS QAS:34196222} having side effects. ***  Wt Readings from Last 3 Encounters:  07/06/17 247 lb (112 kg)  02/28/17 246 lb (111.6 kg)  02/07/17 248 lb (112.5 kg)    ------------------------------------------------------------------------  Chronic pain syndrome From 02/07/2018-no changes. Continue current medications.     Allergies  Allergen Reactions  . Levofloxacin Anaphylaxis  . Levaquin  [Levofloxacin In D5w]     lip swelling, SOB.  Mar[place] Kitchen Penicillins     Pt tolerates Cefdinir Tolerated cefdinir so unsure if she is allergic  . Sulfa Antibiotics Other (See Comments) and Itching     Current Outpatient Medications:  .  albuterol (VENTOLIN HFA) 108 (90 Base) MCG/ACT inhaler, Inhale 2 puffs into the lungs every 6 (six) hours as needed., Disp: 54 g, Rfl: 1 .  aspirin 81 MG EC tablet, Take 81 mg by mouth daily.  , Disp: , Rfl:  .  b complex vitamins capsule, Take 1 capsule by mouth Once PRN.  , Disp: , Rfl:  .  budesonide-formoterol (SYMBICORT) 160-4.5 MCG/ACT inhaler, Inhale 2 puffs into the lungs 2 (two) times daily., Disp: 1 Inhaler, Rfl: 6 .  cefdinir (OMNICEF) 300 MG capsule, Take 1 capsule (300 mg total) by mouth 2 (two) times daily., Disp: 20 capsule, Rfl: 0 .  clonazePAM (KLONOPIN) 1 MG tablet, Take by mouth. Take  1-2 tablets daily and 2 tablets at bedtime. , Disp: , Rfl:  .   dexlansoprazole (DEXILANT) 60 MG capsule, Take 1 capsule (60 mg total) daily by mouth., Disp: 90 capsule, Rfl: 4 .  diclofenac (VOLTAREN) 75 MG EC tablet, Take 1 tablet (75 mg total) by mouth 2 (two) times daily as needed., Disp: 180 tablet, Rfl: 3 .  diclofenac sodium (VOLTAREN) 1 % GEL, APPLY TO AFFECTED AREA FOUR TIMES A DAY AS NEEDED, Disp: 300 g, Rfl: 4 .  escitalopram (LEXAPRO) 20 MG tablet, Take 1 tablet (20 mg total) by mouth daily. Take 1/2 tablet daily for six days, then increase to one full tablet daily, Disp: 90 tablet, Rfl: 4 .  fluticasone (FLONASE) 50 MCG/ACT nasal spray, Place 2 sprays into both nostrils daily., Disp: 48 g, Rfl: 4 .  glucose blood (ACCU-CHEK AVIVA PLUS) test strip, Use to check blood sugar once a day. E11.9, Disp: 100 each, Rfl: 4 .  HYDROcodone-acetaminophen (NORCO/VICODIN) 5-325 MG tablet, Take 1-2 tablets by mouth every 6 (six) hours as needed for moderate pain., Disp: 120 tablet, Rfl: 0 .  loratadine (CLARITIN) 10 MG tablet, Take 1 tablet by mouth daily., Disp: , Rfl:  .  losartan-hydrochlorothiazide (HYZAAR) 50-12.5 MG tablet, Take 1 tablet by mouth daily., Disp: 90 tablet, Rfl: 4 .  metFORMIN (GLUCOPHAGE-XR) 500 MG 24 hr tablet, Take 2 tablets (1,000 mg total) by mouth daily with breakfast., Disp: 180 tablet, Rfl: 3 .  montelukast (SINGULAIR) 10 MG tablet, Take 1 tablet (10 mg total) by mouth daily., Disp: 90 tablet, Rfl: 4 .  Omega 3-6-9 Fatty Acids (OMEGA 3-6-9 COMPLEX) CAPS, Take 1 capsule by mouth daily., Disp: , Rfl:  .  Probiotic Product (PROBIOTIC PO), Take 3 tablets by mouth daily., Disp: , Rfl:  .  ranitidine (ZANTAC) 150 MG capsule, Take 1 capsule (150 mg total) 2 (two) times daily by mouth., Disp: 180 capsule, Rfl: 4 .  simvastatin (ZOCOR) 20 MG tablet, Take 1 tablet (20 mg total) by mouth at bedtime., Disp: 90 tablet, Rfl: 4 .  sucralfate (CARAFATE) 1 g tablet, Take 1 tablet (1 g total) by mouth 4 (four) times daily., Disp: 60 tablet, Rfl: 0 .   traMADol-acetaminophen (ULTRACET) 37.5-325 MG tablet, Take 1 tablet by mouth every 6 (six) hours as needed., Disp: 180 tablet, Rfl: 0 .  valACYclovir (VALTREX) 1000 MG tablet, Take 1 tablet by mouth. Every 8 hours for seven days as needed, Disp: , Rfl:  .  vitamin C (ASCORBIC ACID) 250 MG tablet, Take 4 tablets by mouth daily., Disp: , Rfl:   Review of Systems  Constitutional: Negative for appetite change, chills, fatigue and fever.  Respiratory: Negative for chest tightness and shortness of breath.   Cardiovascular: Negative for chest pain and palpitations.  Gastrointestinal: Negative for abdominal pain, nausea and vomiting.  Neurological: Negative for dizziness and weakness.    Social History   Tobacco Use  . Smoking status: Current Every Day Smoker    Packs/day: 1.50    Years: 34.00    Pack years: 51.00    Types: Cigarettes    Last attempt to quit: 03/18/2009    Years since quitting: 8.3  . Smokeless tobacco: Never Used  Substance Use Topics  . Alcohol use: Yes    Alcohol/week: 0.0 oz    Comment: occasional use   Objective:   There were no vitals taken for this visit. There were no vitals filed for this visit.   Physical Exam  Assessment & Plan:           Lelon Huh, MD  [facility]

## 2017-08-07 ENCOUNTER — Telehealth: Payer: Self-pay | Admitting: Family Medicine

## 2017-08-07 DIAGNOSIS — F3177 Bipolar disorder, in partial remission, most recent episode mixed: Secondary | ICD-10-CM

## 2017-08-07 NOTE — Telephone Encounter (Signed)
Pt is requesting referral to [facility] for depression and anxiety

## 2017-08-13 ENCOUNTER — Encounter: Payer: Self-pay | Admitting: Family Medicine

## 2017-08-13 ENCOUNTER — Ambulatory Visit: Payer: Medicare HMO | Admitting: Family Medicine

## 2017-08-13 VITALS — BP 110/84 | HR 67 | Temp 98.3°F | Resp 16 | Wt 242.0 lb

## 2017-08-13 DIAGNOSIS — G8929 Other chronic pain: Secondary | ICD-10-CM | POA: Diagnosis not present

## 2017-08-13 DIAGNOSIS — M25561 Pain in right knee: Secondary | ICD-10-CM | POA: Diagnosis not present

## 2017-08-13 DIAGNOSIS — E119 Type 2 diabetes mellitus without complications: Secondary | ICD-10-CM | POA: Diagnosis not present

## 2017-08-13 DIAGNOSIS — E785 Hyperlipidemia, unspecified: Secondary | ICD-10-CM

## 2017-08-13 DIAGNOSIS — Z6837 Body mass index (BMI) 37.0-37.9, adult: Secondary | ICD-10-CM | POA: Diagnosis not present

## 2017-08-13 DIAGNOSIS — M779 Enthesopathy, unspecified: Secondary | ICD-10-CM | POA: Diagnosis not present

## 2017-08-13 DIAGNOSIS — R079 Chest pain, unspecified: Secondary | ICD-10-CM | POA: Diagnosis not present

## 2017-08-13 DIAGNOSIS — R0602 Shortness of breath: Secondary | ICD-10-CM | POA: Diagnosis not present

## 2017-08-13 DIAGNOSIS — J42 Unspecified chronic bronchitis: Secondary | ICD-10-CM | POA: Diagnosis not present

## 2017-08-13 DIAGNOSIS — F3177 Bipolar disorder, in partial remission, most recent episode mixed: Secondary | ICD-10-CM

## 2017-08-13 DIAGNOSIS — M778 Other enthesopathies, not elsewhere classified: Secondary | ICD-10-CM

## 2017-08-13 DIAGNOSIS — I700 Atherosclerosis of aorta: Secondary | ICD-10-CM | POA: Diagnosis not present

## 2017-08-13 MED ORDER — NITROGLYCERIN 0.4 MG SL SUBL: 0.4000 mg | SUBLINGUAL_TABLET | SUBLINGUAL | 2 refills | Status: DC | PRN

## 2017-08-13 MED ORDER — TRAMADOL-ACETAMINOPHEN 37.5-325 MG PO TABS: 1.0000 | ORAL_TABLET | Freq: Four times a day (QID) | ORAL | 0 refills | Status: DC | PRN

## 2017-08-13 MED ORDER — VALACYCLOVIR HCL 1 G PO TABS: 1000.0000 mg | ORAL_TABLET | Freq: Three times a day (TID) | ORAL | 3 refills | Status: DC | PRN

## 2017-08-13 MED ORDER — FLUTICASONE-UMECLIDIN-VILANT 100-62.5-25 MCG/INH IN AEPB: 1.0000 | INHALATION_SPRAY | Freq: Every day | RESPIRATORY_TRACT | 3 refills | Status: DC

## 2017-08-13 NOTE — Progress Notes (Signed)
Patient: Misty Bishop [facility], [place] Female    DOB: Nov 15, 1955   62 y.o.   MRN: [id number] Visit Date: 08/13/2017  Today's Provider: Lelon Huh, MD   Chief Complaint  Patient presents with  . Follow-up  . Diabetes  . Hypertension  . Hyperlipidemia  . Hospitalization Follow-up   Subjective:    HPI    Follow up ER visit  Patient was seen in ER for chest pain on 07/06/2017. She was treated for chest pain. Treatment for this included; patient was given GI cocktail. Patient was discharged home with sucralfate and discussed importance of continuing rantidine. She has appt with cardiology, Dr. Nehemiah Massed, on  08/19/2017. She reports good compliance with treatment. She reports this condition is Unchanged.  States she still has pains in chest which are usually on the left and sometimes radiating into the left side of her neck. Takes 81mg  ASA, but sometimes takes 4 after gets chest pain.   She states stomach pains have resolved since starting sucralfate, but not having much affect on chest pains.  ----------------------------------------------------------------     Diabetes Mellitus Type II, Follow-up:   Lab Results  Component Value Date   HGBA1C 8.8 09/04/2016   HGBA1C 7.5 05/15/2016   HGBA1C 6.8 04/18/2015   Last seen for diabetes 6 months ago.  Management since then includes; labs ordered, but zero done. She reports fair compliance with treatment. She is not having side effects. none Current symptoms include none and have been unchanged. Home blood sugar records: fasting range: 144-181  Episodes of hypoglycemia? no   Current Insulin Regimen: nopne Most Recent Eye Exam: 05/17/2016 Weight trend: stable Prior visit with dietician: no Current diet: in general, an "unhealthy" diet Current exercise: none  ----------------------------------------------------------------   Hypertension, follow-up:  BP Readings from Last 3 Encounters:  08/13/17 110/84  07/06/17 140/78   02/07/17 132/72    She was last seen for hypertension 6 months ago.  BP at that visit was 132/72. Management since that visit includes; no changes.She reports good compliance with treatment. She is not having side effects. none She is not exercising. She is adherent to low salt diet.   Outside blood pressures are not checking. She is experiencing none.  Patient denies none.   Cardiovascular risk factors include diabetes mellitus.  Use of agents associated with hypertension: none.   ---------------------------------------------------------------    Lipid/Cholesterol, Follow-up:   Last seen for this 6 months ago.  Management since that visit includes; labs ordered, but zero done.  Last Lipid Panel:    Component Value Date/Time   CHOL 185 06/09/2014   CHOL 183 09/02/2011 0208   TRIG 139 06/09/2014   TRIG 214 (H) 09/02/2011 0208   HDL 37 06/09/2014   HDL 55 09/02/2011 0208   VLDL 43 (H) 09/02/2011 0208   LDLCALC 120 06/09/2014   LDLCALC 85 09/02/2011 0208    She reports good compliance with treatment. She is not having side effects.   Wt Readings from Last 3 Encounters:  08/13/17 242 lb (109.8 kg)  07/06/17 247 lb (112 kg)  02/28/17 246 lb (111.6 kg)    ----------------------------------------------------------------  Follow up asthma/COPD/Chronic bronchitis She states she has been feeling more short of breath recently, states that Symbicort helps a little bit, but she doesn't take consistently due cost. Even when she takes it it does not completely reliever her shortness of breath.   Also patient has long history of Bipolar disorder and requires follow up with psychiatry, however referral  was refused by [employer]. She has been stable on current medications for several years, but reports that she has had a lot of stressors at home lately and would like to have counselor in addition to psychiatrist to refill medications.   She also complains of pain on the back of her  left thumb for several months often associated with bruising around MCP joint.   Allergies  Allergen Reactions  . Levofloxacin Anaphylaxis  . Levaquin  [Levofloxacin In D5w]     lip swelling, SOB.  Marland Kitchen Penicillins     Pt tolerates Cefdinir Tolerated cefdinir so unsure if she is allergic  . Sulfa Antibiotics Other (See Comments) and Itching     Current Outpatient Medications:  .  albuterol (VENTOLIN HFA) 108 (90 Base) MCG/ACT inhaler, Inhale 2 puffs into the lungs every 6 (six) hours as needed., Disp: 54 g, Rfl: 1 .  aspirin 81 MG EC tablet, Take 81 mg by mouth daily.  , Disp: , Rfl:  .  b complex vitamins capsule, Take 1 capsule by mouth Once PRN.  , Disp: , Rfl:  .  budesonide-formoterol (SYMBICORT) 160-4.5 MCG/ACT inhaler, Inhale 2 puffs into the lungs 2 (two) times daily., Disp: 1 Inhaler, Rfl: 6 .  clonazePAM (KLONOPIN) 1 MG tablet, Take by mouth. Take 1-2 tablets daily and 2 tablets at bedtime. , Disp: , Rfl:  .  dexlansoprazole (DEXILANT) 60 MG capsule, Take 1 capsule (60 mg total) daily by mouth., Disp: 90 capsule, Rfl: 4 .  diclofenac (VOLTAREN) 75 MG EC tablet, Take 1 tablet (75 mg total) by mouth 2 (two) times daily as needed., Disp: 180 tablet, Rfl: 3 .  diclofenac sodium (VOLTAREN) 1 % GEL, APPLY TO AFFECTED AREA FOUR TIMES A DAY AS NEEDED, Disp: 300 g, Rfl: 4 .  escitalopram (LEXAPRO) 20 MG tablet, Take 1 tablet (20 mg total) by mouth daily. Take 1/2 tablet daily for six days, then increase to one full tablet daily, Disp: 90 tablet, Rfl: 4 .  fluticasone (FLONASE) 50 MCG/ACT nasal spray, Place 2 sprays into both nostrils daily., Disp: 48 g, Rfl: 4 .  glucose blood (ACCU-CHEK AVIVA PLUS) test strip, Use to check blood sugar once a day. E11.9, Disp: 100 each, Rfl: 4 .  HYDROcodone-acetaminophen (NORCO/VICODIN) 5-325 MG tablet, Take 1-2 tablets by mouth every 6 (six) hours as needed for moderate pain., Disp: 120 tablet, Rfl: 0 .  loratadine (CLARITIN) 10 MG tablet, Take 1 tablet by  mouth daily., Disp: , Rfl:  .  losartan-hydrochlorothiazide (HYZAAR) 50-12.5 MG tablet, Take 1 tablet by mouth daily., Disp: 90 tablet, Rfl: 4 .  metFORMIN (GLUCOPHAGE-XR) 500 MG 24 hr tablet, Take 2 tablets (1,000 mg total) by mouth daily with breakfast., Disp: 180 tablet, Rfl: 3 .  montelukast (SINGULAIR) 10 MG tablet, Take 1 tablet (10 mg total) by mouth daily., Disp: 90 tablet, Rfl: 4 .  Omega 3-6-9 Fatty Acids (OMEGA 3-6-9 COMPLEX) CAPS, Take 1 capsule by mouth daily., Disp: , Rfl:  .  ranitidine (ZANTAC) 150 MG capsule, Take 1 capsule (150 mg total) 2 (two) times daily by mouth., Disp: 180 capsule, Rfl: 4 .  simvastatin (ZOCOR) 20 MG tablet, Take 1 tablet (20 mg total) by mouth at bedtime., Disp: 90 tablet, Rfl: 4 .  sucralfate (CARAFATE) 1 g tablet, Take 1 tablet (1 g total) by mouth 4 (four) times daily., Disp: 60 tablet, Rfl: 0 .  traMADol-acetaminophen (ULTRACET) 37.5-325 MG tablet, Take 1 tablet by mouth every 6 (six) hours as needed.,  Disp: 180 tablet, Rfl: 0 .  valACYclovir (VALTREX) 1000 MG tablet, Take 1 tablet by mouth. Every 8 hours for seven days as needed, Disp: , Rfl:  .  vitamin C (ASCORBIC ACID) 250 MG tablet, Take 4 tablets by mouth daily., Disp: , Rfl:  .  Probiotic Product (PROBIOTIC PO), Take 3 tablets by mouth daily., Disp: , Rfl:   Review of Systems  Constitutional: Negative for appetite change, chills, fatigue and fever.  Respiratory: Negative for chest tightness and shortness of breath.   Cardiovascular: Negative for chest pain and palpitations.  Gastrointestinal: Negative for abdominal pain, nausea and vomiting.  Neurological: Negative for dizziness and weakness.    Social History   Tobacco Use  . Smoking status: Current Every Day Smoker    Packs/day: 1.50    Years: 34.00    Pack years: 51.00    Types: Cigarettes    Last attempt to quit: 03/18/2009    Years since quitting: 8.4  . Smokeless tobacco: Never Used  Substance Use Topics  . Alcohol use: Yes     Alcohol/week: 0.0 oz    Comment: occasional use   Objective:   BP 110/84 (BP Location: Left Arm, Patient Position: Sitting, Cuff Size: Large)   Pulse 67   Temp 98.3 F (36.8 C) (Oral)   Resp 16   Wt 242 lb (109.8 kg)   SpO2 96%   BMI 37.90 kg/m  Vitals:   08/13/17 0911  BP: 110/84  Pulse: 67  Resp: 16  Temp: 98.3 F (36.8 C)  TempSrc: Oral  SpO2: 96%  Weight: 242 lb (109.8 kg)     Physical Exam   General Appearance:    Alert, cooperative, no distress  Eyes:    PERRL, conjunctiva/corneas clear, EOM's intact       Lungs:     Rare expiratory wheeze,  respirations unlabored  Heart:    Regular rate and rhythm  Neurologic:   Awake, alert, oriented x 3. No apparent focal neurological           defect.   MS:   Pain on extension against resistance of left first MCP with faint violaceous discoloration.        Assessment & Plan:     1. Chest pain, unspecified type Multiple cardiac risk factors has been taking extra doses of aspirin when she has pains. Advised to continue 81 every day and given prescription for nitroglycerine to take when pain occurs. Is scheduled to see Dr. Nehemiah Massed later this week.   2. Thumb tendonitis Refer orthopedics  3. Chronic pain of right knee refill- traMADol-acetaminophen (ULTRACET) 37.5-325 MG tablet; Take 1 tablet by mouth every 6 (six) hours as needed.  Dispense: 180 tablet; Refill: 0  4. Type 2 diabetes mellitus without complication, without long-term current use of insulin ([place]) She went to lab today to check a1c.   5. Hyperlipidemia, unspecified hyperlipidemia type She is tolerating simvastatin well with no adverse effects.  Consider change to high intensity statin after reviewing labs.   6. Shortness of breath Some improvement with Symbicort, but not well controlled. Doubt CHF as her weight has gone done and she has not signs of fluid overload. Change inhaler to Trelegy  7. BMI 37.0-37.9, adult Continue diabetic diet.   8. Chronic  bronchitis, unspecified chronic bronchitis type ([place]) Try trelegy as above.   9. Aortic atherosclerosis ([place])   10. Bipolar disorder, in partial remission, most recent episode mixed ([place]) Current medications have been effective, but she is  having a lot of home stressors and would like to see a counselor. [employer] refused referral we had requested. Will try to get with another clinic.   Addressed extensive list of chronic and acute medical problems today requiring extensive time in counseling and coordination of care.  Over half of this 45 minute visit were spent in counseling and coordinating care of multiple medical problems.       Lelon Huh, MD  [facility]

## 2017-08-14 ENCOUNTER — Telehealth: Payer: Self-pay | Admitting: *Deleted

## 2017-08-14 LAB — CBC
Hematocrit: 43.6 % (ref 34.0–46.6)
Hemoglobin: 15.2 g/dL (ref 11.1–15.9)
MCH: 32 pg (ref 26.6–33.0)
MCHC: 34.9 g/dL (ref 31.5–35.7)
MCV: 92 fL (ref 79–97)
Platelets: 197 10*3/uL (ref 150–450)
RBC: 4.75 x10E6/uL (ref 3.77–5.28)
RDW: 13 % (ref 12.3–15.4)
WBC: 6.9 10*3/uL (ref 3.4–10.8)

## 2017-08-14 LAB — COMPREHENSIVE METABOLIC PANEL
ALBUMIN: 4.2 g/dL (ref 3.6–4.8)
ALT: 35 IU/L — AB (ref 0–32)
AST: 25 IU/L (ref 0–40)
Albumin/Globulin Ratio: 1.8 (ref 1.2–2.2)
Alkaline Phosphatase: 46 IU/L (ref 39–117)
BILIRUBIN TOTAL: 0.8 mg/dL (ref 0.0–1.2)
BUN/Creatinine Ratio: 12 (ref 12–28)
BUN: 9 mg/dL (ref 8–27)
CALCIUM: 8.9 mg/dL (ref 8.7–10.3)
CO2: 24 mmol/L (ref 20–29)
CREATININE: 0.76 mg/dL (ref 0.57–1.00)
Chloride: 103 mmol/L (ref 96–106)
GFR calc Af Amer: 98 mL/min/{1.73_m2} (ref >59)
GFR calc non Af Amer: 85 mL/min/{1.73_m2} (ref >59)
GLUCOSE: 150 mg/dL — AB (ref 65–99)
Globulin, Total: 2.3 g/dL (ref 1.5–4.5)
POTASSIUM: 3.9 mmol/L (ref 3.5–5.2)
SODIUM: 142 mmol/L (ref 134–144)
Total Protein: 6.5 g/dL (ref 6.0–8.5)

## 2017-08-14 LAB — LIPID PANEL
CHOLESTEROL TOTAL: 159 mg/dL (ref 100–199)
Chol/HDL Ratio: 4.3 ratio (ref 0.0–4.4)
HDL: 37 mg/dL — AB (ref >39)
LDL Calculated: 91 mg/dL (ref 0–99)
TRIGLYCERIDES: 156 mg/dL — AB (ref 0–149)
VLDL CHOLESTEROL CAL: 31 mg/dL (ref 5–40)

## 2017-08-14 LAB — HEMOGLOBIN A1C
ESTIMATED AVERAGE GLUCOSE: 154 mg/dL
Hgb A1c MFr Bld: 7 % — ABNORMAL HIGH (ref 4.8–5.6)

## 2017-08-14 MED ORDER — ATORVASTATIN CALCIUM 40 MG PO TABS: 40.0000 mg | ORAL_TABLET | Freq: Every day | ORAL | 0 refills | Status: DC

## 2017-08-14 NOTE — Telephone Encounter (Signed)
-----   Message from Birdie Sons, MD sent at 08/14/2017  9:56 AM EDT ----- A1c is better at 7.0%, sugar is averaging 154.  LDL cholesterol is 91, but needs to be under 70 due to having diabetes. Need to change simvastatin to atorvastatin 40mg  once a day, #90, rf x1.  Kidney, liver functions, and blood count were normal.  Need to schedule follow up for diabetes and chol in 3 months.   Also, she requested a letter yesterday, but i'm not sure exactly what she needs it to say. Can you check with her see how she needs it written, thanks.

## 2017-08-14 NOTE — Telephone Encounter (Signed)
Patient was notified of results. Expressed understanding. Rx was sent to pharmacy. 

## 2017-08-15 DIAGNOSIS — R9431 Abnormal electrocardiogram [ECG] [EKG]: Secondary | ICD-10-CM | POA: Diagnosis not present

## 2017-08-15 DIAGNOSIS — I208 Other forms of angina pectoris: Secondary | ICD-10-CM | POA: Diagnosis not present

## 2017-08-15 DIAGNOSIS — I10 Essential (primary) hypertension: Secondary | ICD-10-CM | POA: Diagnosis not present

## 2017-08-15 DIAGNOSIS — E782 Mixed hyperlipidemia: Secondary | ICD-10-CM | POA: Diagnosis not present

## 2017-08-21 ENCOUNTER — Other Ambulatory Visit: Payer: Self-pay | Admitting: Family Medicine

## 2017-08-21 DIAGNOSIS — F419 Anxiety disorder, unspecified: Secondary | ICD-10-CM

## 2017-08-21 DIAGNOSIS — F32A Depression, unspecified: Secondary | ICD-10-CM

## 2017-08-21 DIAGNOSIS — F329 Major depressive disorder, single episode, unspecified: Secondary | ICD-10-CM

## 2017-08-21 DIAGNOSIS — F3177 Bipolar disorder, in partial remission, most recent episode mixed: Secondary | ICD-10-CM

## 2017-08-22 ENCOUNTER — Other Ambulatory Visit: Payer: Self-pay | Admitting: Pharmacist

## 2017-08-22 ENCOUNTER — Telehealth: Payer: Self-pay | Admitting: Family Medicine

## 2017-08-22 DIAGNOSIS — F32A Depression, unspecified: Secondary | ICD-10-CM

## 2017-08-22 DIAGNOSIS — F329 Major depressive disorder, single episode, unspecified: Secondary | ICD-10-CM

## 2017-08-22 DIAGNOSIS — F419 Anxiety disorder, unspecified: Secondary | ICD-10-CM

## 2017-08-22 NOTE — Telephone Encounter (Signed)
We are working on referral to a different psychiatrist.  Please clarify what she is requesting, which medication is she needing a refill for.

## 2017-08-22 NOTE — Telephone Encounter (Signed)
Pt is calling saying that the psy group that she was referred to says that her case is too complicated for them to handle. They do not want to take her as a patient.  She said she is needing medication for anxiety and PTSD.  She uses [facility]  Pt's call back is  [phone]  Con Memos  .

## 2017-08-22 NOTE — Patient Outreach (Signed)
[facility]) Care Management  08/22/2017  Misty Bishop [place] April 10, 1955 [id number]   Outreach call to [facility] regarding her request for follow up from the [facility] Medication Adherence Campaign. Called and speak with patient. HIPAA identifiers verified and verbal consent received.  Misty Bishop reports that she is taking her metformin ER 500 mg - 2 tablets daily. However, reports that she misses a dose once or twice a week. Counsel patient on the importance of medication adherence. Misty Bishop verbalizes understanding. Reports that she has been taking the medication in a divided way, 1 tablet each morning and 1 tablet at bedtime and that she would sometimes fall asleep before taking the evening dose. Advise patient to try taking either both tablets each morning with breakfast or taking 1 tablet with breakfast and 1 tablet with supper in order to improve her adherence. Misty Bishop verbalizes understanding and states that she will try doing the 2 tablets each day with breakfast.   Misty Bishop reports that she was just switched from simvastatin to atorvastatin for improved lowering of her cholesterol. Counsel on the importance of adherence to this medication. Patient states that she has been taking her atorvastatin at bedtime, but also has difficulty with remembering this medication at bedtime. Advise patient that with atorvastatin, due to its relatively long half-life, it can be taken once daily in the morning with her other medications to improve her ability to be adherent. Patient verbalizes understanding.  Misty Bishop denies any further medication questions/concerns at this time. Patient expresses appreciation for my phone call. Provide patient with my phone number. Will close pharmacy episode at this time.  Harlow Asa, PharmD, [facility] [phone]

## 2017-08-22 NOTE — Telephone Encounter (Signed)
Please review

## 2017-08-22 NOTE — Telephone Encounter (Signed)
Pt advised. Is requesting refill of Klonopin and Lexapro.

## 2017-08-25 DIAGNOSIS — I208 Other forms of angina pectoris: Secondary | ICD-10-CM | POA: Diagnosis not present

## 2017-08-25 MED ORDER — CLONAZEPAM 1 MG PO TABS: ORAL_TABLET | ORAL | 1 refills | Status: DC

## 2017-08-25 MED ORDER — ESCITALOPRAM OXALATE 20 MG PO TABS: 20.0000 mg | ORAL_TABLET | Freq: Every day | ORAL | 4 refills | Status: DC

## 2017-09-01 DIAGNOSIS — E782 Mixed hyperlipidemia: Secondary | ICD-10-CM | POA: Diagnosis not present

## 2017-09-01 DIAGNOSIS — I10 Essential (primary) hypertension: Secondary | ICD-10-CM | POA: Diagnosis not present

## 2017-09-01 DIAGNOSIS — R9431 Abnormal electrocardiogram [ECG] [EKG]: Secondary | ICD-10-CM | POA: Diagnosis not present

## 2017-09-01 DIAGNOSIS — R072 Precordial pain: Secondary | ICD-10-CM | POA: Diagnosis not present

## 2017-09-03 ENCOUNTER — Other Ambulatory Visit: Payer: Self-pay | Admitting: Family Medicine

## 2017-09-03 DIAGNOSIS — M5137 Other intervertebral disc degeneration, lumbosacral region: Secondary | ICD-10-CM

## 2017-09-03 NOTE — Telephone Encounter (Signed)
Patient calling back checking on the status of the Hydrocodone-acetaminophen refill request.  Per patient she doesn't need a prescription for the nitroGlycerin that she only wanted to know how soon can she get it fill since she has 2 refills on the old prescription.  Thanks,  -Joseline

## 2017-09-03 NOTE — Telephone Encounter (Signed)
Patient calling to request a refill on the following medications. Patient [facility]. Thanks CC  nitroGLYCERIN (NITROSTAT) 0.4 MG SL tablet   HYDROcodone-acetaminophen (NORCO/VICODIN) 5-325 MG tablet

## 2017-09-04 MED ORDER — HYDROCODONE-ACETAMINOPHEN 5-325 MG PO TABS: 1.0000 | ORAL_TABLET | Freq: Four times a day (QID) | ORAL | 0 refills | Status: DC | PRN

## 2017-09-04 NOTE — Progress Notes (Signed)
Patient: Misty Bishop [facility] Female    DOB: 03-18-1956   62 y.o.   MRN: [id number] Visit Date: 09/05/2017  Today's Provider: Lelon Huh, MD   Chief Complaint  Patient presents with  . Medication Refill   Subjective:    HPI Patient is traveling to [place] next week, then to the [place] to visit her ex-husband's family. She anticipates a long stay, or possibly travelling [place] and [place] to [place] over the next several months. She would like to get any vaccines recommended for travel to [place]. She is doing well with current medications and requests refills. She also states that she has never had the measles and is concerned about possible exposure.    Allergies  Allergen Reactions  . Levofloxacin Anaphylaxis  . Levaquin  [Levofloxacin In D5w]     lip swelling, SOB.  Marland Kitchen Penicillins     Pt tolerates Cefdinir Tolerated cefdinir so unsure if she is allergic  . Sulfa Antibiotics Other (See Comments) and Itching     Current Outpatient Medications:  .  albuterol (VENTOLIN HFA) 108 (90 Base) MCG/ACT inhaler, Inhale 2 puffs into the lungs every 6 (six) hours as needed., Disp: 54 g, Rfl: 1 .  aspirin 81 MG EC tablet, Take 81 mg by mouth daily.  , Disp: , Rfl:  .  atorvastatin (LIPITOR) 40 MG tablet, Take 1 tablet (40 mg total) by mouth daily., Disp: 90 tablet, Rfl: 0 .  b complex vitamins capsule, Take 1 capsule by mouth Once PRN.  , Disp: , Rfl:  .  clonazePAM (KLONOPIN) 1 MG tablet, Take 1-2 tablets daily and 2 tablets at bedtime., Disp: 90 tablet, Rfl: 1 .  dexlansoprazole (DEXILANT) 60 MG capsule, Take 1 capsule (60 mg total) daily by mouth., Disp: 90 capsule, Rfl: 4 .  diclofenac (VOLTAREN) 75 MG EC tablet, Take 1 tablet (75 mg total) by mouth 2 (two) times daily as needed., Disp: 180 tablet, Rfl: 3 .  diclofenac sodium (VOLTAREN) 1 % GEL, APPLY TO AFFECTED AREA FOUR TIMES A DAY AS NEEDED, Disp: 300 g, Rfl: 4 .  escitalopram (LEXAPRO) 20 MG tablet, Take 1 tablet (20  mg total) by mouth daily., Disp: 90 tablet, Rfl: 4 .  fluticasone (FLONASE) 50 MCG/ACT nasal spray, Place 2 sprays into both nostrils daily., Disp: 48 g, Rfl: 4 .  glucose blood (ACCU-CHEK AVIVA PLUS) test strip, Use to check blood sugar once a day. E11.9, Disp: 100 each, Rfl: 4 .  HYDROcodone-acetaminophen (NORCO/VICODIN) 5-325 MG tablet, Take 1-2 tablets by mouth every 6 (six) hours as needed for moderate pain., Disp: 120 tablet, Rfl: 0 .  loratadine (CLARITIN) 10 MG tablet, Take 1 tablet by mouth daily., Disp: , Rfl:  .  losartan-hydrochlorothiazide (HYZAAR) 50-12.5 MG tablet, Take 1 tablet by mouth daily., Disp: 90 tablet, Rfl: 4 .  metFORMIN (GLUCOPHAGE-XR) 500 MG 24 hr tablet, Take 2 tablets (1,000 mg total) by mouth daily with breakfast., Disp: 180 tablet, Rfl: 3 .  montelukast (SINGULAIR) 10 MG tablet, Take 1 tablet (10 mg total) by mouth daily., Disp: 90 tablet, Rfl: 4 .  nitroGLYCERIN (NITROSTAT) 0.4 MG SL tablet, Place 1 tablet (0.4 mg total) under the tongue every 5 (five) minutes as needed for chest pain (Up to 3 tablets)., Disp: 30 tablet, Rfl: 2 .  Omega 3-6-9 Fatty Acids (OMEGA 3-6-9 COMPLEX) CAPS, Take 1 capsule by mouth daily., Disp: , Rfl:  .  Probiotic Product (PROBIOTIC PO), Take 3 tablets  by mouth daily., Disp: , Rfl:  .  ranitidine (ZANTAC) 150 MG capsule, Take 1 capsule (150 mg total) 2 (two) times daily by mouth., Disp: 180 capsule, Rfl: 4 .  sucralfate (CARAFATE) 1 g tablet, Take 1 tablet (1 g total) by mouth 4 (four) times daily., Disp: 60 tablet, Rfl: 0 .  traMADol-acetaminophen (ULTRACET) 37.5-325 MG tablet, Take 1 tablet by mouth every 6 (six) hours as needed., Disp: 180 tablet, Rfl: 0 .  valACYclovir (VALTREX) 1000 MG tablet, Take 1 tablet (1,000 mg total) by mouth 3 (three) times daily as needed. Every 8 hours for seven days as needed, Disp: 90 tablet, Rfl: 3 .  vitamin C (ASCORBIC ACID) 250 MG tablet, Take 4 tablets by mouth daily., Disp: , Rfl:  .   Fluticasone-Umeclidin-Vilant (TRELEGY ELLIPTA) 100-62.5-25 MCG/INH AEPB, Inhale 1 puff into the lungs daily. (Patient not taking: Reported on 09/05/2017), Disp: 3 each, Rfl: 3  Review of Systems  Constitutional: Negative for appetite change, chills, fatigue and fever.  Respiratory: Negative for chest tightness and shortness of breath.   Cardiovascular: Negative for chest pain and palpitations.  Gastrointestinal: Negative for abdominal pain, nausea and vomiting.  Neurological: Negative for dizziness and weakness.    Social History   Tobacco Use  . Smoking status: Current Every Day Smoker    Packs/day: 1.50    Years: 34.00    Pack years: 51.00    Types: Cigarettes    Last attempt to quit: 03/18/2009    Years since quitting: 8.4  . Smokeless tobacco: Never Used  Substance Use Topics  . Alcohol use: Yes    Alcohol/week: 0.0 oz    Comment: occasional use   Objective:   BP 128/72 (BP Location: Right Arm, Patient Position: Sitting, Cuff Size: Large)   Pulse 60   Temp 98.9 F (37.2 C)   Resp 16   Wt 244 lb 9.6 oz (110.9 kg)   SpO2 99%   BMI 38.31 kg/m     Physical Exam   General Appearance:    Alert, cooperative, no distress  Eyes:    PERRL, conjunctiva/corneas clear, EOM's intact       Lungs:     Clear to auscultation bilaterally, respirations unlabored  Heart:    Regular rate and rhythm  Neurologic:   Awake, alert, oriented x 3. No apparent focal neurological           defect.           Assessment & Plan:     1. Exposure to measles virus  - Measles/Mumps/Rubella Immunity  2. Travel advice encounter  - Hepatitis A vaccine adult IM - Hepatitis B vaccine adult IM - typhoid (VIVOTIF) DR capsule; Take 1 capsule by mouth every other day.  Dispense: 4 capsule; Refill: 0  3. Hyperlipidemia, unspecified hyperlipidemia type refill- atorvastatin (LIPITOR) 40 MG tablet; Take 1 tablet (40 mg total) by mouth daily.  Dispense: 90 tablet; Refill: 3  4. Gastroesophageal reflux  disease, esophagitis presence not specified refill- sucralfate (CARAFATE) 1 g tablet; Take 1 tablet (1 g total) by mouth 2 (two) times daily.  Dispense: 180 tablet; Refill: 3  5. Benign essential HTN Well controlled. refill- losartan-hydrochlorothiazide (HYZAAR) 50-12.5 MG tablet; Take 1 tablet by mouth daily.  Dispense: 90 tablet; Refill: [place], MD  [facility]

## 2017-09-04 NOTE — Telephone Encounter (Signed)
Patient called to check the status of this prescription refill. She states she is going out of town and wants to make sure she has her medication. She has an appointment scheduled to come in tomorrow, but she says she may have to cancel is she cant find a ride.

## 2017-09-05 ENCOUNTER — Encounter: Payer: Self-pay | Admitting: Family Medicine

## 2017-09-05 ENCOUNTER — Ambulatory Visit (INDEPENDENT_AMBULATORY_CARE_PROVIDER_SITE_OTHER): Payer: Medicare HMO | Admitting: Family Medicine

## 2017-09-05 VITALS — BP 128/72 | HR 60 | Temp 98.9°F | Resp 16 | Wt 244.6 lb

## 2017-09-05 DIAGNOSIS — Z7184 Encounter for health counseling related to travel: Secondary | ICD-10-CM

## 2017-09-05 DIAGNOSIS — Z23 Encounter for immunization: Secondary | ICD-10-CM | POA: Diagnosis not present

## 2017-09-05 DIAGNOSIS — I10 Essential (primary) hypertension: Secondary | ICD-10-CM

## 2017-09-05 DIAGNOSIS — K219 Gastro-esophageal reflux disease without esophagitis: Secondary | ICD-10-CM | POA: Diagnosis not present

## 2017-09-05 DIAGNOSIS — Z20828 Contact with and (suspected) exposure to other viral communicable diseases: Secondary | ICD-10-CM | POA: Diagnosis not present

## 2017-09-05 DIAGNOSIS — E785 Hyperlipidemia, unspecified: Secondary | ICD-10-CM | POA: Diagnosis not present

## 2017-09-05 DIAGNOSIS — Z7189 Other specified counseling: Secondary | ICD-10-CM | POA: Diagnosis not present

## 2017-09-05 MED ORDER — SUCRALFATE 1 G PO TABS: 1.0000 g | ORAL_TABLET | Freq: Two times a day (BID) | ORAL | 3 refills | Status: DC

## 2017-09-05 MED ORDER — ATORVASTATIN CALCIUM 40 MG PO TABS: 40.0000 mg | ORAL_TABLET | Freq: Every day | ORAL | 3 refills | Status: DC

## 2017-09-05 MED ORDER — LOSARTAN POTASSIUM-HCTZ 50-12.5 MG PO TABS: 1.0000 | ORAL_TABLET | Freq: Every day | ORAL | 4 refills | Status: DC

## 2017-09-05 MED ORDER — TYPHOID VACCINE PO CPDR: 1.0000 | DELAYED_RELEASE_CAPSULE | ORAL | 0 refills | Status: DC

## 2017-09-05 NOTE — Patient Instructions (Signed)
   Return in 2 months for second hepatitis A vaccine

## 2017-09-06 LAB — MEASLES/MUMPS/RUBELLA IMMUNITY
MUMPS ABS, IGG: 130 AU/mL (ref >10.9)
RUBELLA: 25.3 {index} (ref >0.99)

## 2017-09-09 ENCOUNTER — Telehealth: Payer: Self-pay

## 2017-09-09 NOTE — Telephone Encounter (Signed)
LMTCB

## 2017-09-09 NOTE — Telephone Encounter (Signed)
-----   Message from Birdie Sons, MD sent at 09/07/2017  9:14 PM EDT ----- Patient is immune to measles mumps, and rubella, does not need vaccine.

## 2017-09-11 NOTE — Telephone Encounter (Signed)
Left detailed message on pt's vm. Okay per dpr.  

## 2017-10-10 ENCOUNTER — Other Ambulatory Visit: Payer: Self-pay | Admitting: Family Medicine

## 2017-10-10 DIAGNOSIS — F32A Depression, unspecified: Secondary | ICD-10-CM

## 2017-10-10 DIAGNOSIS — F419 Anxiety disorder, unspecified: Secondary | ICD-10-CM

## 2017-10-10 DIAGNOSIS — F329 Major depressive disorder, single episode, unspecified: Secondary | ICD-10-CM

## 2017-10-10 DIAGNOSIS — I10 Essential (primary) hypertension: Secondary | ICD-10-CM

## 2017-10-27 ENCOUNTER — Other Ambulatory Visit: Payer: Self-pay | Admitting: Family Medicine

## 2017-10-27 DIAGNOSIS — M5137 Other intervertebral disc degeneration, lumbosacral region: Secondary | ICD-10-CM

## 2017-10-27 MED ORDER — HYDROCODONE-ACETAMINOPHEN 5-325 MG PO TABS: 1.0000 | ORAL_TABLET | Freq: Four times a day (QID) | ORAL | 0 refills | Status: DC | PRN

## 2017-10-27 NOTE — Telephone Encounter (Signed)
Pt needs refill on her hydrocodone 5/325  [place] in [facility]

## 2017-10-29 ENCOUNTER — Telehealth: Payer: Self-pay

## 2017-11-27 ENCOUNTER — Telehealth: Payer: Self-pay | Admitting: Family Medicine

## 2017-11-27 ENCOUNTER — Other Ambulatory Visit: Payer: Self-pay | Admitting: Family Medicine

## 2017-11-27 DIAGNOSIS — E785 Hyperlipidemia, unspecified: Secondary | ICD-10-CM

## 2017-11-27 DIAGNOSIS — K219 Gastro-esophageal reflux disease without esophagitis: Secondary | ICD-10-CM

## 2017-11-27 MED ORDER — ATORVASTATIN CALCIUM 40 MG PO TABS
40.0000 mg | ORAL_TABLET | Freq: Every day | ORAL | 4 refills | Status: DC
Start: 2017-11-27 — End: 2018-03-26

## 2017-11-27 MED ORDER — SUCRALFATE 1 G PO TABS: 1.0000 g | ORAL_TABLET | Freq: Two times a day (BID) | ORAL | 4 refills | Status: DC

## 2017-11-27 NOTE — Telephone Encounter (Signed)
[facility] pharmacy faxed a refill request for the following medications. Thanks CC  sucralfate (CARAFATE) 1 g tablet  atorvastatin (LIPITOR) 40 MG tablet

## 2017-11-27 NOTE — Telephone Encounter (Signed)
I left a message asking the pt to call me at [phone] to schedule AWV w/ [facility]. Last AWV VDM (DD) °

## 2017-12-22 NOTE — Telephone Encounter (Signed)
Opened in error

## 2018-02-13 ENCOUNTER — Telehealth: Payer: Self-pay

## 2018-02-13 NOTE — Telephone Encounter (Signed)
Call pt regarding lung screening. PT phone line disconnected.

## 2018-02-18 ENCOUNTER — Telehealth: Payer: Self-pay | Admitting: *Deleted

## 2018-02-18 NOTE — Telephone Encounter (Signed)
Unable to leave voicemail for patient regarding lung screening scan due in near future. Email sent to email address in EMR.

## 2018-03-03 ENCOUNTER — Encounter: Payer: Self-pay | Admitting: *Deleted

## 2018-03-03 ENCOUNTER — Telehealth: Payer: Self-pay | Admitting: *Deleted

## 2018-03-03 NOTE — Telephone Encounter (Signed)
Attempted to contact patient r/t LDCT Screening follow up due at this time. Attempted to contact patient r/t LDCT Screening follow up due at this time.  No answer received, unable to leave message at this time, will attempt contact at later date.  Unable to connect with telephone letter mailed for low dose screening follow up due

## 2018-03-13 DIAGNOSIS — N390 Urinary tract infection, site not specified: Secondary | ICD-10-CM | POA: Diagnosis not present

## 2018-03-13 DIAGNOSIS — N898 Other specified noninflammatory disorders of vagina: Secondary | ICD-10-CM | POA: Diagnosis not present

## 2018-03-13 DIAGNOSIS — R21 Rash and other nonspecific skin eruption: Secondary | ICD-10-CM | POA: Diagnosis not present

## 2018-03-23 ENCOUNTER — Other Ambulatory Visit: Payer: Self-pay | Admitting: Family Medicine

## 2018-03-23 DIAGNOSIS — M5137 Other intervertebral disc degeneration, lumbosacral region: Secondary | ICD-10-CM

## 2018-03-23 MED ORDER — HYDROCODONE-ACETAMINOPHEN 5-325 MG PO TABS: 1.0000 | ORAL_TABLET | Freq: Four times a day (QID) | ORAL | 0 refills | Status: DC | PRN

## 2018-03-23 NOTE — Telephone Encounter (Signed)
Pt needing a refill on:  HYDROcodone-acetaminophen (NORCO/VICODIN) 5-325 MG tablet  Please fill at:  [facility] [address] [place] [phone] (Phone) [phone] (Fax)   Thanks, [employer]

## 2018-03-26 ENCOUNTER — Other Ambulatory Visit: Payer: Self-pay

## 2018-03-26 DIAGNOSIS — L918 Other hypertrophic disorders of the skin: Secondary | ICD-10-CM

## 2018-03-26 DIAGNOSIS — E785 Hyperlipidemia, unspecified: Secondary | ICD-10-CM

## 2018-03-26 MED ORDER — ATORVASTATIN CALCIUM 40 MG PO TABS: 40.0000 mg | ORAL_TABLET | Freq: Every day | ORAL | 4 refills | Status: DC

## 2018-03-26 MED ORDER — RANITIDINE HCL 150 MG PO CAPS: 150.0000 mg | ORAL_CAPSULE | Freq: Two times a day (BID) | ORAL | 4 refills | Status: DC

## 2018-04-28 ENCOUNTER — Other Ambulatory Visit: Payer: Self-pay | Admitting: Family Medicine

## 2018-04-28 DIAGNOSIS — M5137 Other intervertebral disc degeneration, lumbosacral region: Secondary | ICD-10-CM

## 2018-04-28 MED ORDER — HYDROCODONE-ACETAMINOPHEN 5-325 MG PO TABS: 1.0000 | ORAL_TABLET | Freq: Four times a day (QID) | ORAL | 0 refills | Status: DC | PRN

## 2018-04-28 NOTE — Telephone Encounter (Signed)
Pt needing a refill on: HYDROcodone-acetaminophen (NORCO/VICODIN) 5-325 MG tablet   Please call into:  [place], [place] [phone] (Phone) [phone] (Fax)   Pt also wants to know the dates on her flu and pneumonia shot.  Please advise.  Thanks, [employer]

## 2018-05-08 ENCOUNTER — Telehealth: Payer: Self-pay

## 2018-05-08 NOTE — Telephone Encounter (Signed)
Called pt to see is she could come in on 05/12/18 @ 2:40 to complete her AWV. CPE is scheduled for 05/27/18. Need to be scheduled prior to that. Will try again later.  -MM

## 2018-05-27 ENCOUNTER — Encounter: Payer: Medicare HMO | Admitting: Family Medicine

## 2018-06-01 NOTE — Telephone Encounter (Signed)
I have tried to contact this pt several times and there is NANM on every attempt. Pt has not called the clinic back either. Closing encounter. -MM

## 2018-08-08 ENCOUNTER — Other Ambulatory Visit: Payer: Self-pay | Admitting: Family Medicine

## 2018-08-08 DIAGNOSIS — E119 Type 2 diabetes mellitus without complications: Secondary | ICD-10-CM

## 2018-08-11 ENCOUNTER — Other Ambulatory Visit: Payer: Self-pay | Admitting: Family Medicine

## 2018-08-11 DIAGNOSIS — G8929 Other chronic pain: Secondary | ICD-10-CM

## 2018-09-15 ENCOUNTER — Other Ambulatory Visit: Payer: Self-pay | Admitting: Family Medicine

## 2018-09-15 DIAGNOSIS — F32A Depression, unspecified: Secondary | ICD-10-CM

## 2018-09-15 DIAGNOSIS — F419 Anxiety disorder, unspecified: Secondary | ICD-10-CM

## 2018-09-15 DIAGNOSIS — F329 Major depressive disorder, single episode, unspecified: Secondary | ICD-10-CM

## 2018-09-22 ENCOUNTER — Telehealth: Payer: Self-pay | Admitting: Family Medicine

## 2018-09-22 DIAGNOSIS — M5137 Other intervertebral disc degeneration, lumbosacral region: Secondary | ICD-10-CM

## 2018-09-22 NOTE — Telephone Encounter (Signed)
Pt needs refill on her [place]  CB#  [phone]  Thanks Con Memos

## 2018-09-22 NOTE — Telephone Encounter (Signed)
Last office visit was 09/05/2017  Please advise.   Thanks,   -Mickel Baas

## 2018-09-23 MED ORDER — HYDROCODONE-ACETAMINOPHEN 5-325 MG PO TABS: 1.0000 | ORAL_TABLET | Freq: Four times a day (QID) | ORAL | 0 refills | Status: DC | PRN

## 2018-10-21 ENCOUNTER — Other Ambulatory Visit: Payer: Self-pay | Admitting: Family Medicine

## 2018-10-21 DIAGNOSIS — I10 Essential (primary) hypertension: Secondary | ICD-10-CM

## 2018-11-27 IMAGING — US US ABDOMEN COMPLETE
1 series · 14 of 25 positions shown · non-contrast
Comparison: Ultrasound 04/28/2013 .

CLINICAL DATA: Abdominal pain.

EXAM:
ABDOMEN ULTRASOUND COMPLETE

[Series 1: us abdomen complete · 0.22mm/px · 14 of 120 slices shown]
[im 1/120]
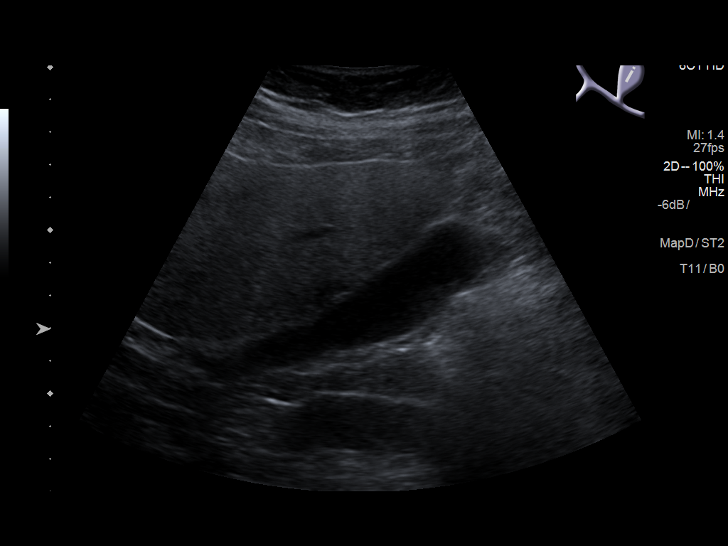
[im 10/120]
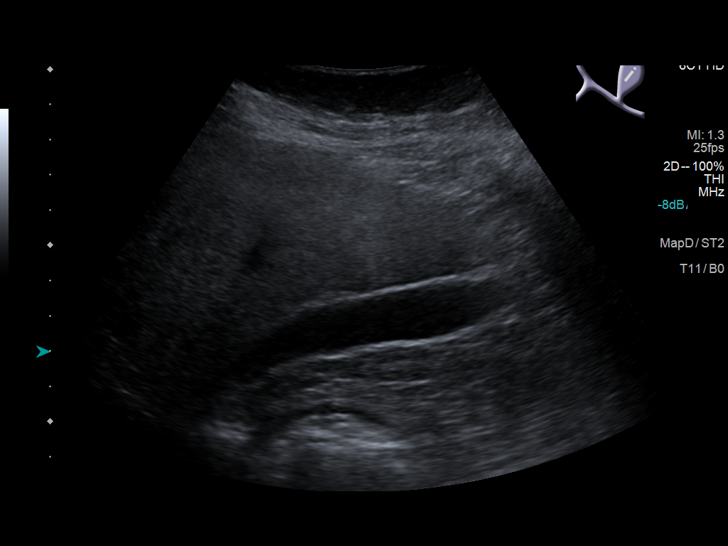
[im 20/120]
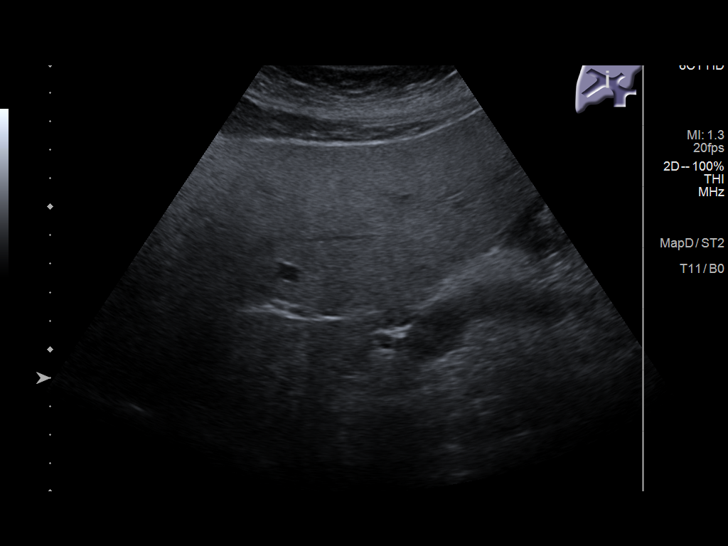
[im 30/120]
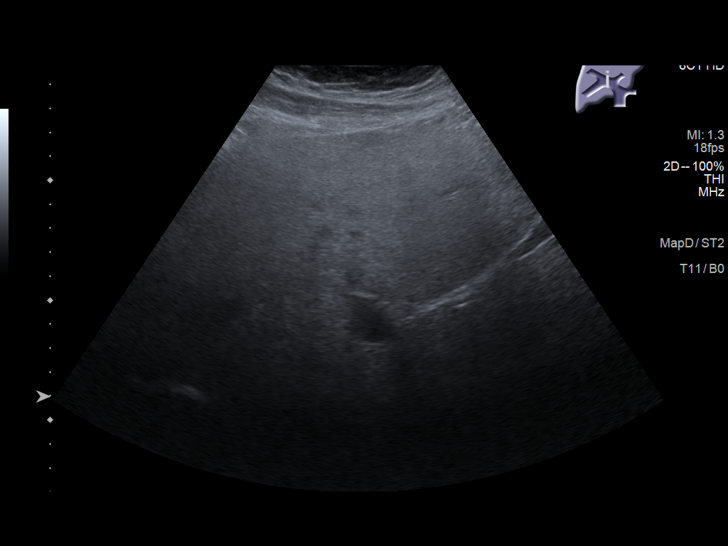
[im 40/120]
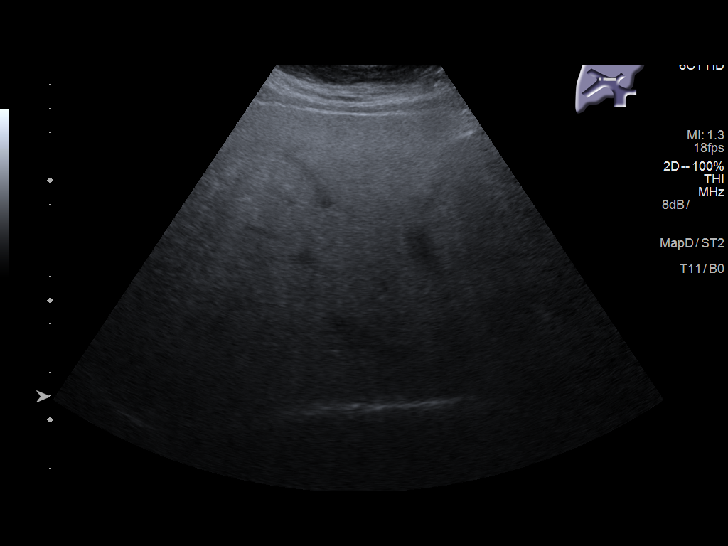
[im 45/120]
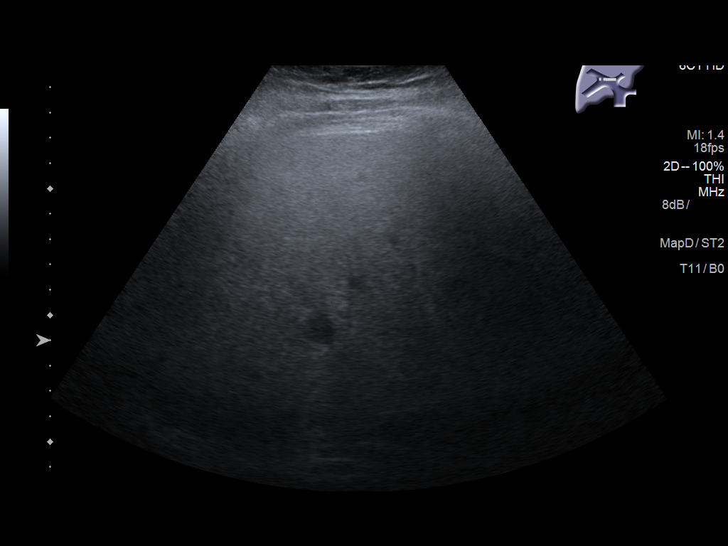
[im 55/120]
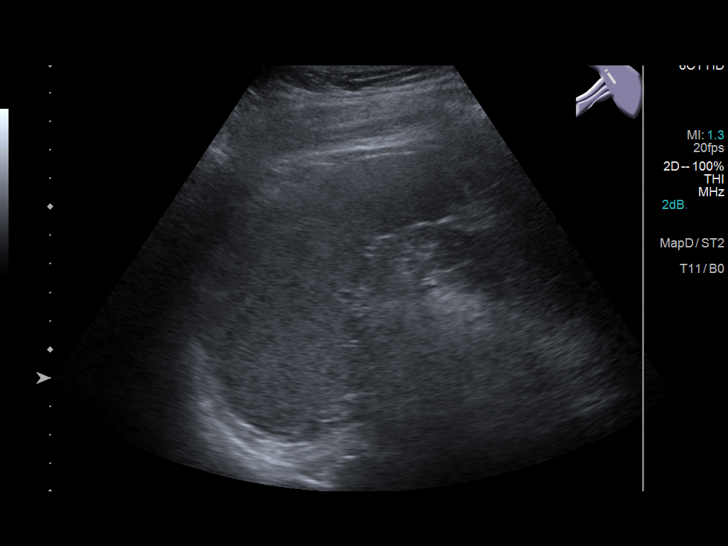
[im 65/120]
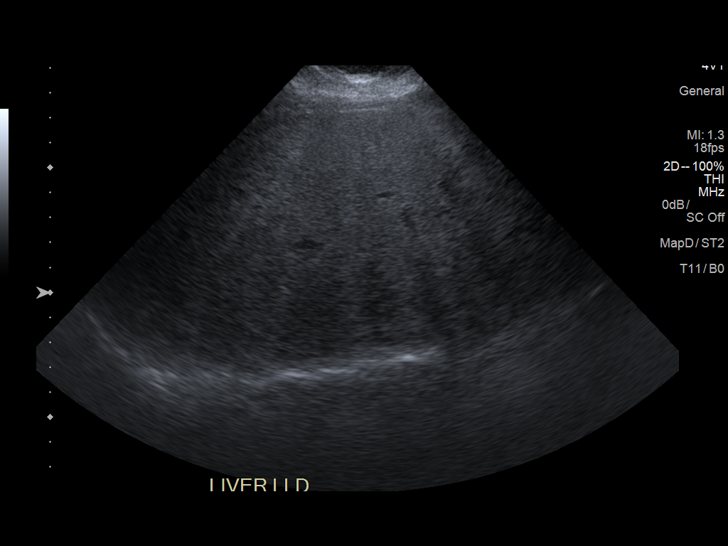
[im 75/120]
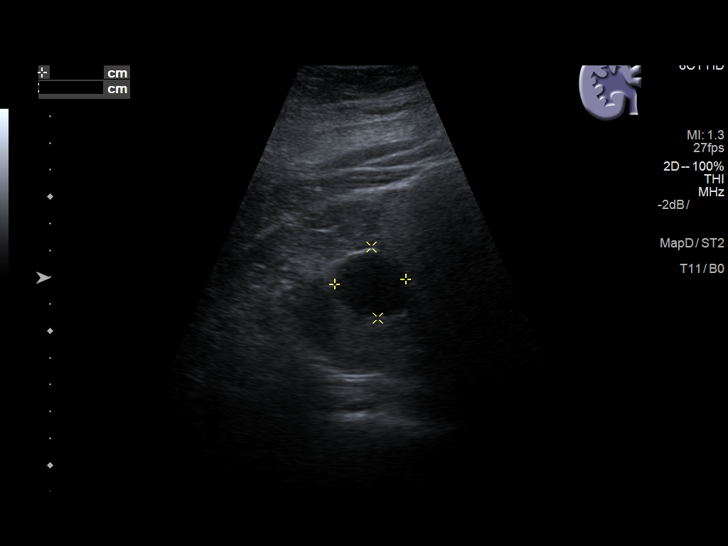
[im 80/120]
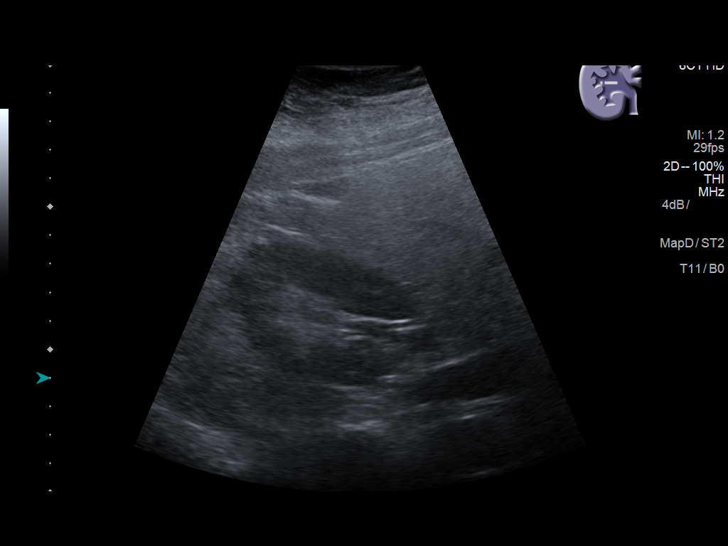
[im 90/120]
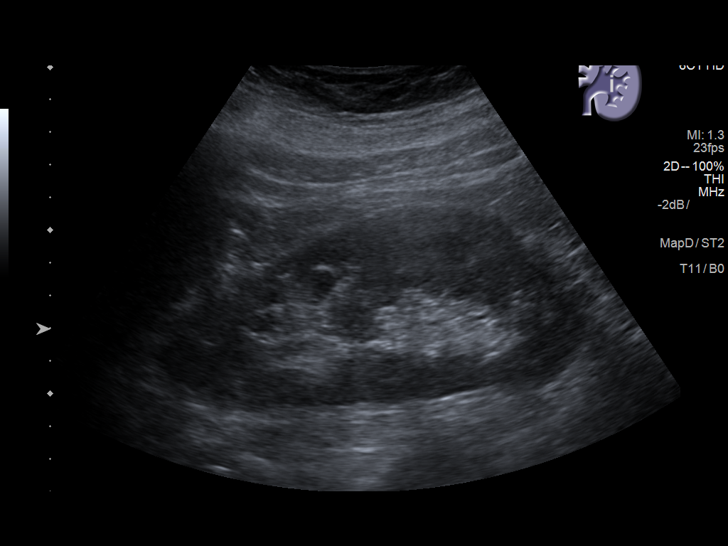
[im 100/120]
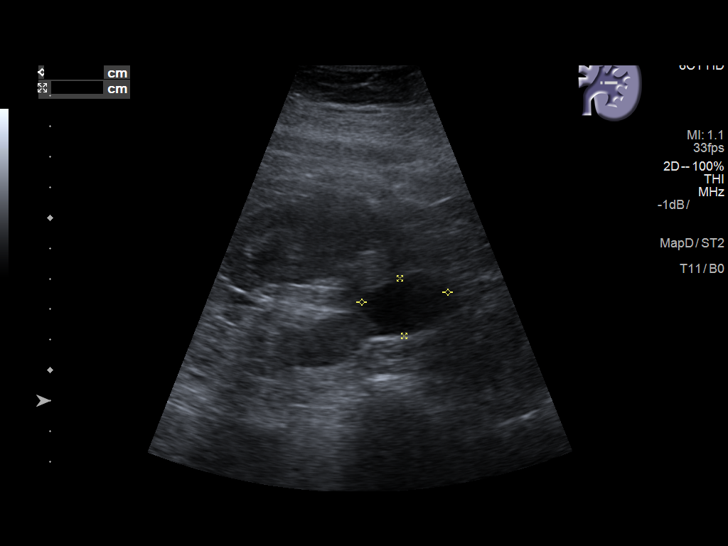
[im 110/120]
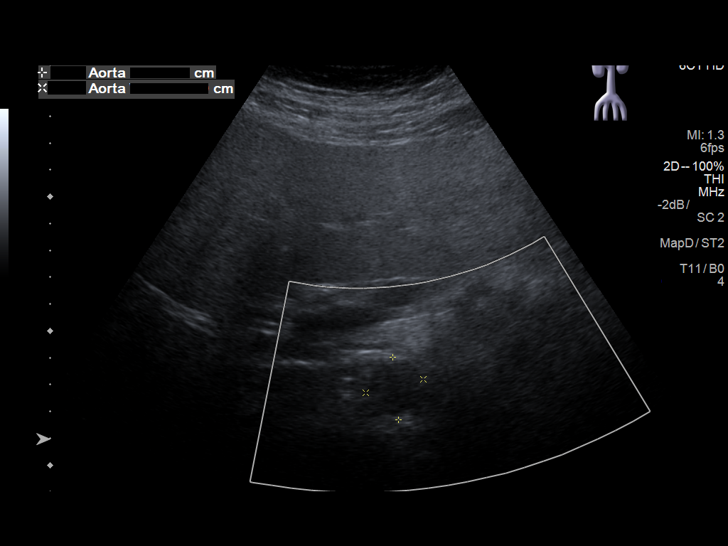
[im 120/120]
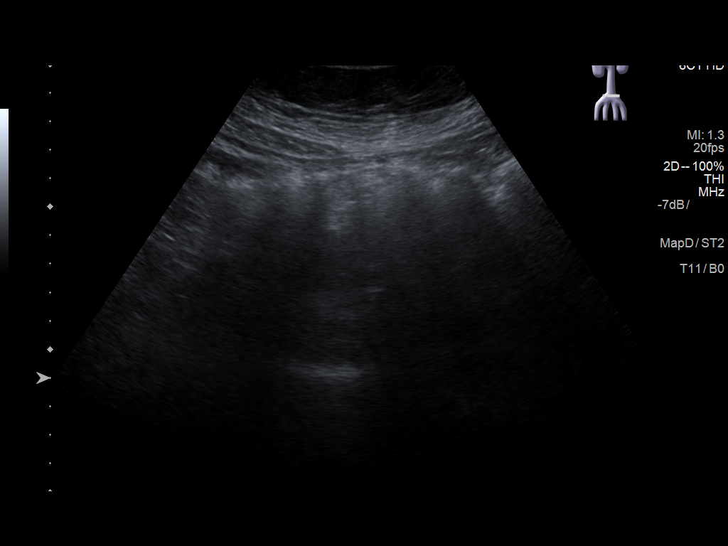

[14 of 25 positions shown; findings below may reference images not displayed]

FINDINGS: Gallbladder: No gallstones or wall thickening visualized. No
sonographic Murphy sign noted by sonographer.

Common bile duct: Diameter: 2.2 mm

Liver: No focal lesion identified. Increase echogenicity. Portal
vein is patent on color Doppler imaging with normal direction of
blood flow towards the liver.

IVC: No abnormality visualized.

Pancreas: Visualized portion unremarkable.

Spleen: Size and appearance within normal limits.

Right Kidney: Length: 12.1 cm. Echogenicity within normal limits.
3.1 and 1.4 cm simple cysts. No hydronephrosis visualized.

Left Kidney: Length: 11.7 cm. Echogenicity within normal limits.
cm simple cyst. No mass or hydronephrosis visualized.

Abdominal aorta: No aneurysm visualized.

Other findings: None.
IMPRESSION: 1. Increased hepatic echogenicity consistent with fatty infiltration
and/or hepatocellular disease. No focal hepatic abnormality
identified. No gallstones or biliary distention. No acute
abnormality.

2.  Simple renal cysts noted bilaterally.

## 2018-12-22 ENCOUNTER — Other Ambulatory Visit: Payer: Self-pay | Admitting: Family Medicine

## 2018-12-22 DIAGNOSIS — M5137 Other intervertebral disc degeneration, lumbosacral region: Secondary | ICD-10-CM

## 2018-12-22 NOTE — Telephone Encounter (Signed)
Add Klonipin 1 mg  teri

## 2018-12-22 NOTE — Telephone Encounter (Signed)
Pt needs refill on   Hydrocodone 5-325  [facility]  [place]

## 2018-12-26 MED ORDER — HYDROCODONE-ACETAMINOPHEN 5-325 MG PO TABS: 1.0000 | ORAL_TABLET | Freq: Four times a day (QID) | ORAL | 0 refills | Status: DC | PRN

## 2018-12-26 MED ORDER — CLONAZEPAM 1 MG PO TABS: ORAL_TABLET | ORAL | 1 refills | Status: DC

## 2018-12-26 NOTE — Telephone Encounter (Signed)
Have sent prescription, but patient has not been seen in over a year. Needs office visit before next refill will be approved.

## 2018-12-28 ENCOUNTER — Other Ambulatory Visit: Payer: Self-pay | Admitting: Family Medicine

## 2018-12-28 DIAGNOSIS — F419 Anxiety disorder, unspecified: Secondary | ICD-10-CM

## 2018-12-28 DIAGNOSIS — F329 Major depressive disorder, single episode, unspecified: Secondary | ICD-10-CM

## 2018-12-28 DIAGNOSIS — F32A Depression, unspecified: Secondary | ICD-10-CM

## 2018-12-28 NOTE — Telephone Encounter (Signed)
Unable to reach patient at this time number seems to be d/c or busy. Will attempt to reach out to patient at a later time. KW

## 2019-01-04 NOTE — Telephone Encounter (Signed)
Tried calling patient at the number listed in chart. Number busy. Will try reaching patient at a later time.

## 2019-01-05 NOTE — Telephone Encounter (Signed)
Attempted to contact patient, seems that # in chart is disconnected. Letter mailed to address in chart.

## 2019-03-13 ENCOUNTER — Other Ambulatory Visit: Payer: Self-pay | Admitting: Family Medicine

## 2019-03-13 DIAGNOSIS — F32A Depression, unspecified: Secondary | ICD-10-CM

## 2019-03-13 DIAGNOSIS — F329 Major depressive disorder, single episode, unspecified: Secondary | ICD-10-CM

## 2019-03-13 DIAGNOSIS — F419 Anxiety disorder, unspecified: Secondary | ICD-10-CM

## 2019-04-05 ENCOUNTER — Other Ambulatory Visit: Payer: Self-pay | Admitting: Family Medicine

## 2019-04-05 DIAGNOSIS — K219 Gastro-esophageal reflux disease without esophagitis: Secondary | ICD-10-CM

## 2019-04-05 DIAGNOSIS — E785 Hyperlipidemia, unspecified: Secondary | ICD-10-CM

## 2019-04-05 NOTE — Telephone Encounter (Signed)
Requested medication (s) are due for refill today:yes  Requested medication (s) are on the active medication list: yes  Last refill:  sucralfate 04/24/18, lipitor 10/21/18  Future visit scheduled:no  Notes to clinic:  not seen in 1 year    Requested Prescriptions  Pending Prescriptions Disp Refills   sucralfate (CARAFATE) 1 g tablet [Pharmacy Med Name: SUCRALFATE 1 GM Tablet] 180 tablet 4    Sig: TAKE 1 TABLET TWICE DAILY      Gastroenterology: Antiacids Failed - 04/05/2019  4:57 PM      Failed - Valid encounter within last 12 months    Recent Outpatient Visits           1 year ago Exposure to measles virus   [facility] Birdie Sons, MD   1 year ago Chest pain, unspecified type   [facility] Birdie Sons, MD   2 years ago Annual physical exam   [facility] Birdie Sons, MD   2 years ago Type 2 diabetes mellitus without complication, without long-term current use of insulin ([place])   [facility] Birdie Sons, MD   2 years ago [place], Kirstie Peri, MD                atorvastatin (LIPITOR) 40 MG tablet [Pharmacy Med Name: ATORVASTATIN CALCIUM 40 MG Tablet] 90 tablet 4    Sig: TAKE 1 TABLET EVERY DAY      Cardiovascular:  Antilipid - Statins Failed - 04/05/2019  4:57 PM      Failed - Total Cholesterol in normal range and within 360 days    Cholesterol, Total  Date Value Ref Range Status  08/13/2017 159 100 - 199 mg/dL Final   Cholesterol  Date Value Ref Range Status  09/02/2011 183 0 - 200 mg/dL Final          Failed - LDL in normal range and within 360 days    Ldl Cholesterol, Calc  Date Value Ref Range Status  09/02/2011 85 0 - 100 mg/dL Final   LDL Calculated  Date Value Ref Range Status  08/13/2017 91 0 - 99 mg/dL Final          Failed - HDL in normal range and within 360 days    HDL Cholesterol  Date Value Ref Range Status  09/02/2011 55 40 -  60 mg/dL Final   HDL  Date Value Ref Range Status  08/13/2017 37 (L) >39 mg/dL Final          Failed - Triglycerides in normal range and within 360 days    Triglycerides  Date Value Ref Range Status  08/13/2017 156 (H) 0 - 149 mg/dL Final  09/02/2011 214 (H) 0 - 200 mg/dL Final          Failed - Valid encounter within last 12 months    Recent Outpatient Visits           1 year ago Exposure to measles virus   [facility] Birdie Sons, MD   1 year ago Chest pain, unspecified type   [facility] Birdie Sons, MD   2 years ago Annual physical exam   [facility] Birdie Sons, MD   2 years ago Type 2 diabetes mellitus without complication, without long-term current use of insulin [facility])   [facility] Birdie Sons, MD   2 years ago [facility] Birdie Sons, MD              Passed - Patient is not pregnant

## 2019-04-26 ENCOUNTER — Other Ambulatory Visit: Payer: Self-pay

## 2019-04-26 ENCOUNTER — Telehealth: Payer: Medicare HMO | Admitting: Family Medicine

## 2019-04-26 DIAGNOSIS — M25561 Pain in right knee: Secondary | ICD-10-CM

## 2019-04-26 DIAGNOSIS — G8929 Other chronic pain: Secondary | ICD-10-CM

## 2019-04-26 DIAGNOSIS — M5137 Other intervertebral disc degeneration, lumbosacral region: Secondary | ICD-10-CM

## 2019-04-26 MED ORDER — DICLOFENAC SODIUM 75 MG PO TBEC: 75.0000 mg | DELAYED_RELEASE_TABLET | Freq: Two times a day (BID) | ORAL | 3 refills | Status: DC | PRN

## 2019-04-26 MED ORDER — HYDROCODONE-ACETAMINOPHEN 5-325 MG PO TABS: 1.0000 | ORAL_TABLET | Freq: Four times a day (QID) | ORAL | 0 refills | Status: DC | PRN

## 2019-04-26 NOTE — Telephone Encounter (Signed)
[facility] faxed refill request.

## 2019-04-26 NOTE — Telephone Encounter (Signed)
Patient requesting refills. She is currently in [place] trying to sell her sisters house who recently passed away due to [place]. She has an office visit scheduled for 06/28/2019 at 10:40am.   Pharmacy: [facility] Mail order

## 2019-04-26 NOTE — Progress Notes (Deleted)
Patient: Misty Bishop [facility] Female    DOB: 1956-02-15   64 y.o.   MRN: [id number] Visit Date: 04/26/2019  Today's Provider: Lelon Huh, MD   No chief complaint on file.  Subjective:     HPI  Allergies  Allergen Reactions  . Levofloxacin Anaphylaxis  . Levaquin  [Levofloxacin In D5w]     lip swelling, SOB.  Marland Kitchen Penicillins     Pt tolerates Cefdinir Tolerated cefdinir so unsure if she is allergic  . Sulfa Antibiotics Other (See Comments) and Itching     Current Outpatient Medications:  .  Accu-Chek FastClix Lancets MISC, CHECK BLOOD SUGAR EVERY DAY, Disp: 102 each, Rfl: 1 .  ACCU-CHEK SMARTVIEW test strip, CHECK BLOOD SUGAR EVERY DAY, Disp: 100 strip, Rfl: 1 .  albuterol (VENTOLIN HFA) 108 (90 Base) MCG/ACT inhaler, Inhale 2 puffs into the lungs every 6 (six) hours as needed., Disp: 54 g, Rfl: 1 .  aspirin 81 MG EC tablet, Take 81 mg by mouth daily.  , Disp: , Rfl:  .  atorvastatin (LIPITOR) 40 MG tablet, TAKE 1 TABLET EVERY DAY, Disp: 90 tablet, Rfl: 4 .  b complex vitamins capsule, Take 1 capsule by mouth Once PRN.  , Disp: , Rfl:  .  clonazePAM (KLONOPIN) 1 MG tablet, Take 1-2 tablets daily and 2 tablets at bedtime., Disp: 90 tablet, Rfl: 1 .  dexlansoprazole (DEXILANT) 60 MG capsule, Take 1 capsule (60 mg total) daily by mouth., Disp: 90 capsule, Rfl: 4 .  diclofenac (VOLTAREN) 75 MG EC tablet, TAKE 1 TABLET TWICE DAILY AS NEEDED, Disp: 180 tablet, Rfl: 3 .  diclofenac sodium (VOLTAREN) 1 % GEL, APPLY TO AFFECTED AREA FOUR TIMES A DAY AS NEEDED, Disp: 300 g, Rfl: 4 .  escitalopram (LEXAPRO) 20 MG tablet, TAKE 1 TABLET EVERY DAY (NEED MD APPOINTMENT), Disp: 90 tablet, Rfl: 1 .  fluticasone (FLONASE) 50 MCG/ACT nasal spray, Place 2 sprays into both nostrils daily., Disp: 48 g, Rfl: 4 .  Fluticasone-Umeclidin-Vilant (TRELEGY ELLIPTA) 100-62.5-25 MCG/INH AEPB, Inhale 1 puff into the lungs daily. (Patient not taking: Reported on 09/05/2017), Disp: 3 each, Rfl: 3 .   HYDROcodone-acetaminophen (NORCO/VICODIN) 5-325 MG tablet, Take 1-2 tablets by mouth every 6 (six) hours as needed for moderate pain., Disp: 120 tablet, Rfl: 0 .  loratadine (CLARITIN) 10 MG tablet, Take 1 tablet by mouth daily., Disp: , Rfl:  .  losartan-hydrochlorothiazide (HYZAAR) 50-12.5 MG tablet, TAKE 1 TABLET EVERY DAY, Disp: 90 tablet, Rfl: 4 .  metFORMIN (GLUCOPHAGE-XR) 500 MG 24 hr tablet, TAKE 2 TABLETS  DAILY WITH BREAKFAST., Disp: 180 tablet, Rfl: 4 .  montelukast (SINGULAIR) 10 MG tablet, TAKE 1 TABLET (10 MG TOTAL) BY MOUTH DAILY., Disp: 90 tablet, Rfl: 4 .  nitroGLYCERIN (NITROSTAT) 0.4 MG SL tablet, Place 1 tablet (0.4 mg total) under the tongue every 5 (five) minutes as needed for chest pain (Up to 3 tablets)., Disp: 30 tablet, Rfl: 2 .  Omega 3-6-9 Fatty Acids (OMEGA 3-6-9 COMPLEX) CAPS, Take 1 capsule by mouth daily., Disp: , Rfl:  .  Probiotic Product (PROBIOTIC PO), Take 3 tablets by mouth daily., Disp: , Rfl:  .  ranitidine (ZANTAC) 150 MG capsule, Take 1 capsule (150 mg total) by mouth 2 (two) times daily., Disp: 180 capsule, Rfl: 4 .  sucralfate (CARAFATE) 1 g tablet, TAKE 1 TABLET TWICE DAILY, Disp: 180 tablet, Rfl: 4 .  traMADol-acetaminophen (ULTRACET) 37.5-325 MG tablet, Take 1 tablet by mouth every 6 (six) hours  as needed., Disp: 180 tablet, Rfl: 0 .  typhoid (VIVOTIF) DR capsule, Take 1 capsule by mouth every other day., Disp: 4 capsule, Rfl: 0 .  valACYclovir (VALTREX) 1000 MG tablet, Take 1 tablet (1,000 mg total) by mouth 3 (three) times daily as needed. Every 8 hours for seven days as needed, Disp: 90 tablet, Rfl: 3 .  vitamin C (ASCORBIC ACID) 250 MG tablet, Take 4 tablets by mouth daily., Disp: , Rfl:   Review of Systems  Social History   Tobacco Use  . Smoking status: Current Every Day Smoker    Packs/day: 1.50    Years: 34.00    Pack years: 51.00    Types: Cigarettes    Last attempt to quit: 03/18/2009    Years since quitting: 10.1  . Smokeless tobacco:  Never Used  Substance Use Topics  . Alcohol use: Yes    Alcohol/week: 0.0 standard drinks    Comment: occasional use      Objective:   There were no vitals taken for this visit. There were no vitals filed for this visit.There is no height or weight on file to calculate BMI.   Physical Exam   No results found for any visits on 04/26/19.     Assessment & Plan        Lelon Huh, MD  [facility]

## 2019-04-27 MED ORDER — CLONAZEPAM 1 MG PO TABS: ORAL_TABLET | ORAL | 0 refills | Status: DC

## 2019-06-14 ENCOUNTER — Other Ambulatory Visit: Payer: Self-pay | Admitting: Family Medicine

## 2019-06-14 DIAGNOSIS — F32A Depression, unspecified: Secondary | ICD-10-CM

## 2019-06-14 DIAGNOSIS — F329 Major depressive disorder, single episode, unspecified: Secondary | ICD-10-CM

## 2019-06-14 DIAGNOSIS — F419 Anxiety disorder, unspecified: Secondary | ICD-10-CM

## 2019-06-21 ENCOUNTER — Other Ambulatory Visit: Payer: Self-pay

## 2019-06-21 MED ORDER — CLONAZEPAM 1 MG PO TABS: ORAL_TABLET | ORAL | 0 refills | Status: DC

## 2019-06-21 NOTE — Telephone Encounter (Signed)
Dr. Maralyn Sago patient. Please review.

## 2019-06-23 ENCOUNTER — Telehealth: Payer: Self-pay

## 2019-06-23 NOTE — Telephone Encounter (Signed)
Copied from [place] [id number]. Topic: General - Other >> Jun 23, 2019 11:06 AM Antonieta Iba C wrote: Reason for CRM: pt has an apt scheduled. Pt called in to reschedule her apt to the week of 4/20 due to her not being able to get a flight to [place] to come to her apt in time. Advised pt that pcp will be out of the office. Pt would like assistance on what she should do? Pt says that she has to have her apt to continue receiving her medication refills.    Please assist.

## 2019-06-24 NOTE — Telephone Encounter (Signed)
I am not out of the office the week of 07-06-2019. The schedule is just blocked. For healthcare redesign.  You can make out slot for her during my regularly office hours.   If she has moved out of state, then she should  establish with a local physician.

## 2019-06-24 NOTE — Telephone Encounter (Signed)
Pt is calling and would like dr Risk manager nurse to return her call

## 2019-06-24 NOTE — Telephone Encounter (Signed)
Misty Bishop please review Dr. Maralyn Sago message below and schedule appointment. I cant schedule the appointment because there is a block on the schedule.

## 2019-06-28 ENCOUNTER — Ambulatory Visit: Payer: Self-pay | Admitting: Family Medicine

## 2019-06-28 NOTE — Telephone Encounter (Signed)
I see that patient has ben scheduled for 07/06/2019. Has she been notified of this appointment? I tried calling her, but couldn't  get through on either phone number listed in her chart.

## 2019-07-05 ENCOUNTER — Other Ambulatory Visit: Payer: Self-pay

## 2019-07-05 ENCOUNTER — Ambulatory Visit: Payer: Medicare HMO | Attending: Internal Medicine

## 2019-07-05 DIAGNOSIS — Z23 Encounter for immunization: Secondary | ICD-10-CM

## 2019-07-05 NOTE — Progress Notes (Addendum)
   Covid-19 Vaccination Clinic  Name:  Misty Bishop    MRN: [id number] DOB: May 06, 1955  07/05/2019  [name] was observed post Covid-19 immunization for 30 minutes without incident. She was provided with Vaccine Information Sheet and instruction to access the V-Safe system.   [name] was instructed to call 911 with any severe reactions post vaccine: Marland Kitchen Difficulty breathing  . Swelling of face and throat  . A fast heartbeat  . A bad rash all over body  . Dizziness and weakness   Immunizations Administered    Name Date Dose VIS Date Route   Pfizer COVID-19 Vaccine 07/05/2019 10:02 AM 0.3 mL 05/12/2018 Intramuscular   Manufacturer: [employer], [employer]   Lot: [id number]   [place]: [id number]

## 2019-07-05 NOTE — Progress Notes (Signed)
Established patient visit    Patient: Misty Bishop   DOB: 30-Dec-1955   64 y.o. Female  MRN: [id number] Visit Date: 07/06/2019  Today's healthcare provider: Lelon Huh, MD   Chief Complaint  Patient presents with  . Diabetes  . Hypertension  . Hyperlipidemia   Subjective    HPI Diabetes Mellitus Type II, follow-up  Lab Results  Component Value Date   HGBA1C 7.0 (H) 08/13/2017   HGBA1C 8.8 09/04/2016   HGBA1C 7.5 05/15/2016   Last seen for diabetes over 1 years ago.  Management since then includes continuing the same treatment. She reports good compliance with treatment. She is not having side effects.   Home blood sugar records: fasting range: 120's  Episodes of hypoglycemia? No  Current insulin regiment: None Most Recent Eye Exam: not UTD  ----------------------------------------------------------------------------------------- Hypertension, follow-up  BP Readings from Last 3 Encounters:  07/06/19 (!) 158/79  09/05/17 128/72  08/13/17 110/84   She was last seen for hypertension over 1 years ago.  BP at that visit was 128/72. Management since that visit includes no change. She reports good compliance with treatment. She is not having side effects.  She is not exercising. She is not adherent to low salt diet.   Outside blood pressures are being checked at home.  She does smoke.  Use of agents associated with hypertension: none.   ----------------------------------------------------------------------------------------- Lipid/Cholesterol, follow-up  Last Lipid Panel: Lab Results  Component Value Date   CHOL 159 08/13/2017   LDLCALC 91 08/13/2017   HDL 37 (L) 08/13/2017   TRIG 156 (H) 08/13/2017   ALT 35 (H) 08/13/2017   AST 25 08/13/2017   PLT 197 08/13/2017    She was last seen for this over 1 years ago.  Management since that visit includes no change.  She reports good compliance with treatment. She is not having side effects.    Symptoms: No appetite changes No foot ulcerations No chest pain No chest pressure/discomfort No dyspnea No fatigue No lower extremity edema No nausea No numbness or tingling of extremity No orthopnea No palpitations No paroxysmal nocturnal dyspnea No polydipsia No polyuria No speech difficulty No syncope No visual disturbances   She is following a Regular diet. Current exercise: none  Wt Readings from Last 3 Encounters:  07/06/19 238 lb 12.8 oz (108.3 kg)  09/05/17 244 lb 9.6 oz (110.9 kg)  08/13/17 242 lb (109.8 kg)   Last metabolic panel Lab Results  Component Value Date   GLUCOSE 150 (H) 08/13/2017   NA 142 08/13/2017   K 3.9 08/13/2017   BUN 9 08/13/2017   CREATININE 0.76 08/13/2017   GFRNONAA 85 08/13/2017   GFRAA 98 08/13/2017   CALCIUM 8.9 08/13/2017   AST 25 08/13/2017   ALT 35 (H) 08/13/2017   The 10-year ASCVD risk score Mikey Bussing DC Jr., et al., 2013) is: 32.1%  ----------------------------------------------------------------------------------------- She also requests prescription for estrogen for menopausal sx which she started taking while she was in the [place].   She also requests refill for Symbicort and Voltaren gel  Past Medical History:  Diagnosis Date  . Bipolar disorder ([place])   . Chronic gastritis with bleeding   . Depression   . Diabetes mellitus without complication ([place])   . Headache(784.0)   . Hypertension   . Palpitations    Holter monitor in 2008 showed PVCs  . Peptic ulcer disease   . PTSD (post-traumatic stress disorder)    Past Surgical History:  Procedure Laterality Date  .  BREAST BIOPSY     Left, benign  . CESAREAN SECTION    . KNEE SURGERY     Left  . SHOULDER SURGERY     Left  . TONSILLECTOMY         Medications: Outpatient Medications Prior to Visit  Medication Sig  . Accu-Chek FastClix Lancets MISC CHECK BLOOD SUGAR EVERY DAY  . ACCU-CHEK SMARTVIEW test strip CHECK BLOOD SUGAR EVERY DAY  . albuterol  (VENTOLIN HFA) 108 (90 Base) MCG/ACT inhaler Inhale 2 puffs into the lungs every 6 (six) hours as needed.  Marland Kitchen aspirin 81 MG EC tablet Take 81 mg by mouth daily.    Marland Kitchen atorvastatin (LIPITOR) 40 MG tablet TAKE 1 TABLET EVERY DAY  . b complex vitamins capsule Take 1 capsule by mouth Once PRN.    . clonazePAM (KLONOPIN) 1 MG tablet Take 1-2 tablets daily and 2 tablets at bedtime.  Marland Kitchen dexlansoprazole (DEXILANT) 60 MG capsule Take 1 capsule (60 mg total) daily by mouth.  . diclofenac (VOLTAREN) 75 MG EC tablet Take 1 tablet (75 mg total) by mouth 2 (two) times daily as needed.  . diclofenac sodium (VOLTAREN) 1 % GEL APPLY TO AFFECTED AREA FOUR TIMES A DAY AS NEEDED  . escitalopram (LEXAPRO) 20 MG tablet TAKE 1 TABLET EVERY DAY (NEED MD APPOINTMENT)  . HYDROcodone-acetaminophen (NORCO/VICODIN) 5-325 MG tablet Take 1-2 tablets by mouth every 6 (six) hours as needed for moderate pain.  Marland Kitchen losartan-hydrochlorothiazide (HYZAAR) 50-12.5 MG tablet TAKE 1 TABLET EVERY DAY  . metFORMIN (GLUCOPHAGE-XR) 500 MG 24 hr tablet TAKE 2 TABLETS  DAILY WITH BREAKFAST.  . montelukast (SINGULAIR) 10 MG tablet TAKE 1 TABLET (10 MG TOTAL) BY MOUTH DAILY.  Marland Kitchen Omega 3-6-9 Fatty Acids (OMEGA 3-6-9 COMPLEX) CAPS Take 1 capsule by mouth daily.  . Probiotic Product (PROBIOTIC PO) Take 3 tablets by mouth daily.  . ranitidine (ZANTAC) 150 MG capsule Take 1 capsule (150 mg total) by mouth 2 (two) times daily.  . sucralfate (CARAFATE) 1 g tablet TAKE 1 TABLET TWICE DAILY  . traMADol-acetaminophen (ULTRACET) 37.5-325 MG tablet Take 1 tablet by mouth every 6 (six) hours as needed.  . typhoid (VIVOTIF) DR capsule Take 1 capsule by mouth every other day.  . fluticasone (FLONASE) 50 MCG/ACT nasal spray Place 2 sprays into both nostrils daily.  Marland Kitchen loratadine (CLARITIN) 10 MG tablet Take 1 tablet by mouth daily.  . nitroGLYCERIN (NITROSTAT) 0.4 MG SL tablet Place 1 tablet (0.4 mg total) under the tongue every 5 (five) minutes as needed for chest  pain (Up to 3 tablets).  . valACYclovir (VALTREX) 1000 MG tablet Take 1 tablet (1,000 mg total) by mouth 3 (three) times daily as needed. Every 8 hours for Misty days as needed  . vitamin C (ASCORBIC ACID) 250 MG tablet Take 4 tablets by mouth daily.  . [DISCONTINUED] Fluticasone-Umeclidin-Vilant (TRELEGY ELLIPTA) 100-62.5-25 MCG/INH AEPB Inhale 1 puff into the lungs daily.   No facility-administered medications prior to visit.    Review of Systems  Constitutional: Negative.   Respiratory: Negative.   Cardiovascular: Negative.   Musculoskeletal: Negative.        Objective    BP (!) 158/79 (BP Location: Right Arm, Patient Position: Sitting, Cuff Size: Large)   Pulse (!) 59   Temp (!) 96 F (35.6 C) (Temporal)   Ht 5\' 7"  (1.702 m)   Wt 238 lb 12.8 oz (108.3 kg)   BMI 37.40 kg/m    Physical Exam   General: Appearance:  Obese female in no acute distress  Eyes:    PERRL, conjunctiva/corneas clear, EOM's intact       Lungs:     Clear to auscultation bilaterally, respirations unlabored  Heart:    Bradycardic. Normal rhythm. No murmurs, rubs, or gallops.   MS:   All extremities are intact.   Neurologic:   Awake, alert, oriented x 3. No apparent focal neurological           defect.        No results found for any visits on 07/06/19.   Assessment & Plan    1. Type 2 diabetes mellitus without complication, without long-term current use of insulin (HCC)  - Hemoglobin A1c; Future  2. Benign essential HTN  - Comprehensive metabolic panel; Future - TSH; Future - CBC with Differential/Platelet; Future  3. Hyperlipidemia, unspecified hyperlipidemia type She is tolerating atorvastatin well with no adverse effects.   - Lipid panel; Future  4. Breast cancer screening by mammogram  - MM 3D Screening Breast Bilateral - [place]; Future  5. Colon cancer screening  - Cologuard  6. Bipolar disorder, in partial remission, most recent episode mixed ([place]) Stable  on current medication regiment.   7. Aortic atherosclerosis (HCC) Asymptomatic. Compliant with medication.  Continue aggressive risk factor modification.   8. Chronic bronchitis, unspecified chronic bronchitis type ([place]) She has occasion flares which resolve with azithromycin which is refilled today.  - azithromycin (ZITHROMAX) 250 MG tablet; 2 by mouth today, then 1 daily for 4 days  Dispense: 6 tablet; Refill: 0  9. Non-alcoholic fatty liver disease Check lfts and platelets  10. Depression, recurrent ([place]) Continue current antidepressants.   11. Anxiety Stable on current medications.   12. Numbness and tingling She requests referral to evaluate - Vitamin B12 - Ambulatory referral to Neurology  13. Foot pain, bilateral She requests referral to evaluate - Ambulatory referral to Podiatry  14. Degeneration of lumbar or lumbosacral intervertebral disc refill - HYDROcodone-acetaminophen (NORCO/VICODIN) 5-325 MG tablet; Take 1-2 tablets by mouth every 6 (six) hours as needed for moderate pain.  Dispense: 120 tablet; Refill: 0  15. Chronic fatigue syndrome   16. Chronic shoulder pain, unspecified laterality refill - diclofenac Sodium (VOLTAREN) 1 % GEL; Apply 1 application topically 4 (four) times daily as needed.  Dispense: 300 g; Refill: 4  17. Smoking greater than 30 pack years Advised to stop smoking   18. Menopausal symptom Has been on estrogen replacement [place] and requests prescription since she is now living in [place] again.  - estradiol (ESTRACE) 0.5 MG tablet; Take 1 tablet (0.5 mg total) by mouth daily.  Dispense: 30 tablet; Refill: 3     Addressed extensive list of chronic and acute medical problems today requiring 50 minutes reviewing her medical record, counseling patient regarding her conditions and coordination of care.     The entirety of the information documented in the History of Present Illness, Review of Systems and Physical Exam were  personally obtained by me. Portions of this information were initially documented by the CMA and reviewed by me for thoroughness and accuracy.     Lelon Huh, MD  [facility] [phone] (phone) [phone] (fax)  [place]

## 2019-07-06 ENCOUNTER — Ambulatory Visit (INDEPENDENT_AMBULATORY_CARE_PROVIDER_SITE_OTHER): Payer: Medicare HMO | Admitting: Family Medicine

## 2019-07-06 ENCOUNTER — Other Ambulatory Visit: Payer: Self-pay

## 2019-07-06 ENCOUNTER — Encounter: Payer: Self-pay | Admitting: Family Medicine

## 2019-07-06 VITALS — BP 158/79 | HR 59 | Temp 96.0°F | Ht 67.0 in | Wt 238.8 lb

## 2019-07-06 DIAGNOSIS — E785 Hyperlipidemia, unspecified: Secondary | ICD-10-CM

## 2019-07-06 DIAGNOSIS — N951 Menopausal and female climacteric states: Secondary | ICD-10-CM

## 2019-07-06 DIAGNOSIS — J42 Unspecified chronic bronchitis: Secondary | ICD-10-CM

## 2019-07-06 DIAGNOSIS — I10 Essential (primary) hypertension: Secondary | ICD-10-CM

## 2019-07-06 DIAGNOSIS — F419 Anxiety disorder, unspecified: Secondary | ICD-10-CM

## 2019-07-06 DIAGNOSIS — F3177 Bipolar disorder, in partial remission, most recent episode mixed: Secondary | ICD-10-CM | POA: Diagnosis not present

## 2019-07-06 DIAGNOSIS — R200 Anesthesia of skin: Secondary | ICD-10-CM

## 2019-07-06 DIAGNOSIS — K760 Fatty (change of) liver, not elsewhere classified: Secondary | ICD-10-CM

## 2019-07-06 DIAGNOSIS — R202 Paresthesia of skin: Secondary | ICD-10-CM

## 2019-07-06 DIAGNOSIS — F339 Major depressive disorder, recurrent, unspecified: Secondary | ICD-10-CM

## 2019-07-06 DIAGNOSIS — E119 Type 2 diabetes mellitus without complications: Secondary | ICD-10-CM | POA: Diagnosis not present

## 2019-07-06 DIAGNOSIS — G9332 Myalgic encephalomyelitis/chronic fatigue syndrome: Secondary | ICD-10-CM

## 2019-07-06 DIAGNOSIS — R5382 Chronic fatigue, unspecified: Secondary | ICD-10-CM

## 2019-07-06 DIAGNOSIS — M5137 Other intervertebral disc degeneration, lumbosacral region: Secondary | ICD-10-CM

## 2019-07-06 DIAGNOSIS — F17210 Nicotine dependence, cigarettes, uncomplicated: Secondary | ICD-10-CM

## 2019-07-06 DIAGNOSIS — M79672 Pain in left foot: Secondary | ICD-10-CM

## 2019-07-06 DIAGNOSIS — Z1231 Encounter for screening mammogram for malignant neoplasm of breast: Secondary | ICD-10-CM

## 2019-07-06 DIAGNOSIS — Z1211 Encounter for screening for malignant neoplasm of colon: Secondary | ICD-10-CM

## 2019-07-06 DIAGNOSIS — I700 Atherosclerosis of aorta: Secondary | ICD-10-CM

## 2019-07-06 DIAGNOSIS — G8929 Other chronic pain: Secondary | ICD-10-CM

## 2019-07-06 DIAGNOSIS — M25519 Pain in unspecified shoulder: Secondary | ICD-10-CM

## 2019-07-06 DIAGNOSIS — M51379 Other intervertebral disc degeneration, lumbosacral region without mention of lumbar back pain or lower extremity pain: Secondary | ICD-10-CM

## 2019-07-06 DIAGNOSIS — M79671 Pain in right foot: Secondary | ICD-10-CM

## 2019-07-06 NOTE — Patient Instructions (Signed)
Please call the [facility] at [facility] at [phone] to schedule your mammogram.  

## 2019-07-08 MED ORDER — HYDROCODONE-ACETAMINOPHEN 5-325 MG PO TABS: 1.0000 | ORAL_TABLET | Freq: Four times a day (QID) | ORAL | 0 refills | Status: DC | PRN

## 2019-07-08 MED ORDER — DICLOFENAC SODIUM 1 % EX GEL: 1.0000 "application " | Freq: Four times a day (QID) | CUTANEOUS | 4 refills | Status: DC | PRN

## 2019-07-09 ENCOUNTER — Telehealth: Payer: Self-pay

## 2019-07-09 MED ORDER — ESTRADIOL 0.5 MG PO TABS: 0.5000 mg | ORAL_TABLET | Freq: Every day | ORAL | 3 refills | Status: DC

## 2019-07-09 MED ORDER — AZITHROMYCIN 250 MG PO TABS: ORAL_TABLET | ORAL | 0 refills | Status: AC

## 2019-07-09 NOTE — Telephone Encounter (Signed)
Copied from [place] [id number]. Topic: General - Other >> Jul 09, 2019  4:33 PM Leward Quan A wrote: Reason for CRM: Patient called to inquire of of Dr Caryn Section about medication a Zpak and an Estrogen Compound to her Pharmacy. She is asking if Dr can please send Rx to the pharmacy

## 2019-07-12 ENCOUNTER — Telehealth: Payer: Self-pay | Admitting: Family Medicine

## 2019-07-12 DIAGNOSIS — E785 Hyperlipidemia, unspecified: Secondary | ICD-10-CM | POA: Diagnosis not present

## 2019-07-12 DIAGNOSIS — E119 Type 2 diabetes mellitus without complications: Secondary | ICD-10-CM | POA: Diagnosis not present

## 2019-07-12 DIAGNOSIS — I10 Essential (primary) hypertension: Secondary | ICD-10-CM | POA: Diagnosis not present

## 2019-07-12 NOTE — Telephone Encounter (Signed)
Patient needs referral to Dermatologist for places on face and also the Hydrocodone that was sent to [facility] needs to be sent to [facility] In pharmacy instead of [place].

## 2019-07-12 NOTE — Telephone Encounter (Signed)
Tried calling patient to find out if this is something she discussed with provider.  Unable to get call to go though.  Only get busy signal.

## 2019-07-13 ENCOUNTER — Other Ambulatory Visit: Payer: Self-pay | Admitting: Family Medicine

## 2019-07-13 DIAGNOSIS — M5137 Other intervertebral disc degeneration, lumbosacral region: Secondary | ICD-10-CM

## 2019-07-13 LAB — CBC WITH DIFFERENTIAL/PLATELET
Basophils Absolute: 0.1 10*3/uL (ref 0.0–0.2)
Basos: 1 %
EOS (ABSOLUTE): 0.2 10*3/uL (ref 0.0–0.4)
Eos: 3 %
Hematocrit: 45.7 % (ref 34.0–46.6)
Hemoglobin: 15.8 g/dL (ref 11.1–15.9)
Immature Grans (Abs): 0 10*3/uL (ref 0.0–0.1)
Immature Granulocytes: 0 %
Lymphocytes Absolute: 2.4 10*3/uL (ref 0.7–3.1)
Lymphs: 35 %
MCH: 32.9 pg (ref 26.6–33.0)
MCHC: 34.6 g/dL (ref 31.5–35.7)
MCV: 95 fL (ref 79–97)
Monocytes Absolute: 0.5 10*3/uL (ref 0.1–0.9)
Monocytes: 8 %
Neutrophils Absolute: 3.7 10*3/uL (ref 1.4–7.0)
Neutrophils: 53 %
Platelets: 187 10*3/uL (ref 150–450)
RBC: 4.8 x10E6/uL (ref 3.77–5.28)
RDW: 11.6 % — ABNORMAL LOW (ref 11.7–15.4)
WBC: 6.9 10*3/uL (ref 3.4–10.8)

## 2019-07-13 LAB — COMPREHENSIVE METABOLIC PANEL
ALT: 23 IU/L (ref 0–32)
AST: 15 IU/L (ref 0–40)
Albumin/Globulin Ratio: 1.7 (ref 1.2–2.2)
Albumin: 4.3 g/dL (ref 3.8–4.8)
Alkaline Phosphatase: 60 IU/L (ref 39–117)
BUN/Creatinine Ratio: 17 (ref 12–28)
BUN: 13 mg/dL (ref 8–27)
Bilirubin Total: 0.5 mg/dL (ref 0.0–1.2)
CO2: 23 mmol/L (ref 20–29)
Calcium: 9.2 mg/dL (ref 8.7–10.3)
Chloride: 103 mmol/L (ref 96–106)
Creatinine, Ser: 0.77 mg/dL (ref 0.57–1.00)
GFR calc Af Amer: 95 mL/min/{1.73_m2} (ref >59)
GFR calc non Af Amer: 82 mL/min/{1.73_m2} (ref >59)
Globulin, Total: 2.5 g/dL (ref 1.5–4.5)
Glucose: 149 mg/dL — ABNORMAL HIGH (ref 65–99)
Potassium: 4.1 mmol/L (ref 3.5–5.2)
Sodium: 142 mmol/L (ref 134–144)
Total Protein: 6.8 g/dL (ref 6.0–8.5)

## 2019-07-13 LAB — LIPID PANEL
Chol/HDL Ratio: 4.6 ratio — ABNORMAL HIGH (ref 0.0–4.4)
Cholesterol, Total: 193 mg/dL (ref 100–199)
HDL: 42 mg/dL (ref >39)
LDL Chol Calc (NIH): 123 mg/dL — ABNORMAL HIGH (ref 0–99)
Triglycerides: 156 mg/dL — ABNORMAL HIGH (ref 0–149)
VLDL Cholesterol Cal: 28 mg/dL (ref 5–40)

## 2019-07-13 LAB — HEMOGLOBIN A1C
Est. average glucose Bld gHb Est-mCnc: 117 mg/dL
Hgb A1c MFr Bld: 5.7 % — ABNORMAL HIGH (ref 4.8–5.6)

## 2019-07-13 LAB — TSH: TSH: 2.28 u[IU]/mL (ref 0.450–4.500)

## 2019-07-13 NOTE — Telephone Encounter (Signed)
Patient was seen last week and has been taking estrogen, but estrogen alone causes uterine cancer in women that have not had a hysterectomy. I don't see anything in her medical history about having had a hysterectomy. If this is the case then she also needs to take Provera (medroxyprogesterone) 2.5mg  once tablet daily, #30, rf x3. To prevent uterine cancer.  Please check with patient to verify whether or not she has had a hysterectomy and order the Provera if not.

## 2019-07-14 NOTE — Telephone Encounter (Signed)
LMTCB, [place] Triage Nurse may give patient results  Please note there are 2 messages for this patient

## 2019-07-14 NOTE — Telephone Encounter (Signed)
LMTCB, [place] Triage Nurse may give patient results   Please note there are 2 messages on this patient

## 2019-07-15 ENCOUNTER — Ambulatory Visit: Payer: Medicare HMO | Attending: Internal Medicine

## 2019-07-15 DIAGNOSIS — Z20822 Contact with and (suspected) exposure to covid-19: Secondary | ICD-10-CM | POA: Diagnosis not present

## 2019-07-15 NOTE — Telephone Encounter (Signed)
Attempted to call pt.  VM full on home phone.  Left vm on mobile phone to return call to office.

## 2019-07-15 NOTE — Telephone Encounter (Signed)
Attempted to call pt.  Left vm on mobile phone to return call to office.

## 2019-07-16 LAB — NOVEL CORONAVIRUS, NAA: SARS-CoV-2, NAA: NOT DETECTED

## 2019-07-16 LAB — SARS-COV-2, NAA 2 DAY TAT

## 2019-07-16 NOTE — Telephone Encounter (Signed)
LMTCB, okay for PEC to advise patient. There are 2 messages for this patient.

## 2019-07-19 NOTE — Telephone Encounter (Signed)
Attempted to call pt.  Left vm. To return call to office.  (4 attempts have been made to reach pt.)

## 2019-07-19 NOTE — Telephone Encounter (Signed)
Attempted to call pt.  Left vm to return call to office. (4 attempts have been made to reach pt. )

## 2019-07-20 ENCOUNTER — Other Ambulatory Visit: Payer: Self-pay

## 2019-07-20 ENCOUNTER — Ambulatory Visit: Payer: Medicare HMO | Admitting: Podiatry

## 2019-07-20 ENCOUNTER — Ambulatory Visit (INDEPENDENT_AMBULATORY_CARE_PROVIDER_SITE_OTHER): Payer: Medicare HMO

## 2019-07-20 ENCOUNTER — Encounter: Payer: Self-pay | Admitting: Podiatry

## 2019-07-20 ENCOUNTER — Ambulatory Visit: Payer: Self-pay | Admitting: *Deleted

## 2019-07-20 DIAGNOSIS — M7662 Achilles tendinitis, left leg: Secondary | ICD-10-CM

## 2019-07-20 DIAGNOSIS — M722 Plantar fascial fibromatosis: Secondary | ICD-10-CM

## 2019-07-20 DIAGNOSIS — M7731 Calcaneal spur, right foot: Secondary | ICD-10-CM

## 2019-07-20 MED ORDER — MELOXICAM 15 MG PO TABS
15.0000 mg | ORAL_TABLET | Freq: Every day | ORAL | 1 refills | Status: DC
Start: 2019-07-20 — End: 2020-08-15

## 2019-07-20 NOTE — Addendum Note (Signed)
Addended by: Althea Charon D on: 07/20/2019 10:10 AM   Modules accepted: Orders

## 2019-07-20 NOTE — Telephone Encounter (Addendum)
Pt returned call.   Her phone is broken so she is having to depend on her daughter's  phone which her daughter does not always answer.   The land line is [phone].  The cell phone is [phone].   This is what's listed in her chart.   She does not know what her sister's numbers are so she could not verify they were correct.  She is requesting to use MyChart.   That way she gets all the messages and can communicate with Dr. Caryn Section and staff also.    I could not locate a note from Dr. Caryn Section with her lab result interpretation.   "I saw them on MyChart and they all look normal".    I let her know I would request Dr. Caryn Section send her a message on MyChart regarding her lab results.  "That's the best way to communicate with me".  In response to Dr. Maralyn Sago note regarding the Provera and having a hysterectomy or not.   She has NOT had a hysterectomy and is ok with taking the Provera. She is requesting a 3 month supply.  Please send the Rx to [facility] in Pharmacy.  There has been a note sent earlier this morning in regards to her Norco/Vicodan Rx.   She needs it sent to the [facility] in Pharmacy also.    In regards to the dermatology referral she does want that and would prefer to have it ASAP.   Any physician in [place] is fine.   Again she requests you communicate with her via MyChart so she gets the messages since her daughter does not always answer her phone.   Thanks.  I sent this information to Dr. Maralyn Sago office.

## 2019-07-20 NOTE — Telephone Encounter (Signed)
Pt needs her Rx for HYDROcodone-acetaminophen (NORCO/VICODIN) 5-325 MG tablet To be sent to  [place], [place] Phone:  [phone]  Fax:  [phone]     They were sent to [place] and the Pt hasnt used [facility] for a while /please advise

## 2019-07-21 MED ORDER — HYDROCODONE-ACETAMINOPHEN 5-325 MG PO TABS: 1.0000 | ORAL_TABLET | Freq: Four times a day (QID) | ORAL | 0 refills | Status: DC | PRN

## 2019-07-21 NOTE — Addendum Note (Signed)
Addended by: Lelon Huh E on: 07/21/2019 02:07 PM   Modules accepted: Orders

## 2019-07-26 MED ORDER — MEDROXYPROGESTERONE ACETATE 2.5 MG PO TABS
2.5000 mg | ORAL_TABLET | Freq: Every day | ORAL | 3 refills | Status: DC
Start: 2019-07-26 — End: 2020-05-15

## 2019-07-26 NOTE — Progress Notes (Signed)
   HPI: 64 y.o. female presenting today as a new patient with a chief complaint of intermittent stabbing pain to the bilateral heels and Achilles that began a few months ago. She states the left foot hurts worse than the right and it feels like she has a bad bruise. She reports an associated knot on the posterior left heel. She states the pain is worse in the morning when she first gets out of bed or when she stands after being seated for a long period of time. She has been using Voltaren gel, massaging the area and using ice/heat therapies. Patient is here for further evaluation and treatment.   Past Medical History:  Diagnosis Date  . Bipolar disorder ([place])   . Chronic gastritis with bleeding   . Depression   . Diabetes mellitus without complication ([place])   . Headache(784.0)   . Hypertension   . Palpitations    Holter monitor in 2008 showed PVCs  . Peptic ulcer disease   . PTSD (post-traumatic stress disorder)       Physical Exam: General: The patient is alert and oriented x3 in no acute distress.  Dermatology: Skin is warm, dry and supple bilateral lower extremities. Negative for open lesions or macerations.  Vascular: Palpable pedal pulses bilaterally. No edema or erythema noted. Capillary refill within normal limits.  Neurological: Epicritic and protective threshold grossly intact bilaterally.   Musculoskeletal Exam: Pain on palpation noted to the posterior tubercle of the left calcaneus at the insertion of the Achilles tendon consistent with retrocalcaneal bursitis. Range of motion within normal limits. Muscle strength 5/5 in all muscle groups bilateral lower extremities.  Radiographic Exam:  Posterior calcaneal spur noted to the respective calcaneus on lateral view. No fracture or dislocation noted. Normal osseous mineralization noted.     Assessment: 1. Insertional Achilles tendinitis left 2. Posterior heel spur left   Plan of Care:  1. Patient was evaluated.  Radiographs were reviewed today. 2. Injection of 0.5 mL Celestone Soluspan injected into the retrocalcaneal bursa. Care was taken to avoid direct injection into the Achilles tendon. 3. Prescription for Meloxicam provided to patient. 4. Recommended OTC arch supports.  5. Return to clinic in 4 weeks.    [name], DPM [facility]  Dr. [name], [address]                                        [place], [place] [address]                Office [phone]  Fax [phone]

## 2019-07-26 NOTE — Addendum Note (Signed)
Addended by: Birdie Sons on: 07/26/2019 03:19 PM   Modules accepted: Orders

## 2019-07-27 ENCOUNTER — Ambulatory Visit: Payer: Medicare HMO | Attending: Internal Medicine

## 2019-07-27 ENCOUNTER — Telehealth: Payer: Self-pay | Admitting: *Deleted

## 2019-07-27 DIAGNOSIS — Z23 Encounter for immunization: Secondary | ICD-10-CM

## 2019-07-27 NOTE — Progress Notes (Signed)
   Covid-19 Vaccination Clinic  Name:  Misty Bishop    MRN: [id number] DOB: 1955/11/21  07/27/2019  Misty Bishop was observed post Covid-19 immunization for 30 minutes based on pre-vaccination screening .  During the observation period, she experienced an adverse reaction with the following symptoms:  dizziness, nausea and leg cramps, dyspnea, sweating.  Assessment : Time of assessment Alert and oriented and skin dry.  Actions taken:  Vitals sign taken  VAERS form obtained and completed by RN.  At 1029 VS- BP-168/98, P- 60, O2- 97 %, given water and crackers, at 1044 VS- P- 58, O2- 98%, complains of feeling anxious, and dyspnea, 1046 feeling better, no dizziness or cramping, 1050- patient reports breathing better, complains of feeling hot and cold with chills, O2- 97-98%, P- 54=55. 1103- denies all S&S, Vs - BP_ 146/83, P- 59, O2- 99%.    Medications administered: Loratadine (Claritin) 10 mg  by mouth. Time: 1036. Administered by RN.  Disposition: Reports no further symptoms of adverse reaction after observation for 60 minutes. Discharged home.   Immunizations Administered    Name Date Dose VIS Date Route   Pfizer COVID-19 Vaccine 07/27/2019  9:57 AM 0.3 mL 05/12/2018 Intramuscular   Manufacturer: [place]   Lot: [id number]   [place]: [id number]

## 2019-07-27 NOTE — Telephone Encounter (Signed)
Attempted to call patient in regards to her second COVID vaccine that she received today. Spoke with a family member and asked to have the patient give me a return call just so I could check on her and see how she is doing. Will follow up.

## 2019-07-27 NOTE — Progress Notes (Signed)
   Covid-19 Vaccination Clinic  Name:  Misty Bishop    MRN: [id number] DOB: [birth year]  07/27/2019  [name] was observed post Covid-19 immunization for 30 minutes based on pre-vaccination screening without incident. She was provided with Vaccine Information Sheet and instruction to access the V-Safe system.   [name] was instructed to call 911 with any severe reactions post vaccine: [place] Difficulty breathing  . Swelling of face and throat  . A fast heartbeat  . A bad rash all over body  . Dizziness and weakness   Immunizations Administered    Name Date Dose VIS Date Route   Pfizer COVID-19 Vaccine 07/27/2019  9:57 AM 0.3 mL 05/12/2018 Intramuscular   Manufacturer: [place]   Lot: [id number]   [place]: [id number]

## 2019-07-28 ENCOUNTER — Encounter: Payer: Medicare HMO | Admitting: Obstetrics and Gynecology

## 2019-08-20 ENCOUNTER — Ambulatory Visit: Payer: Medicare HMO | Admitting: Podiatry

## 2019-08-23 ENCOUNTER — Telehealth: Payer: Self-pay | Admitting: Family Medicine

## 2019-08-23 ENCOUNTER — Other Ambulatory Visit: Payer: Self-pay

## 2019-08-23 DIAGNOSIS — N951 Menopausal and female climacteric states: Secondary | ICD-10-CM

## 2019-08-23 MED ORDER — ESTRADIOL 0.5 MG PO TABS: 0.5000 mg | ORAL_TABLET | Freq: Every day | ORAL | 0 refills | Status: DC

## 2019-08-23 NOTE — Telephone Encounter (Signed)
[facility] faxed refill request for the following medications:  Estradiol tablet   Please advise.  Thanks, [employer]

## 2019-08-23 NOTE — Telephone Encounter (Signed)
Sent #[phone] to [facility].  CM

## 2019-08-24 ENCOUNTER — Ambulatory Visit: Payer: Medicare HMO

## 2019-08-26 ENCOUNTER — Other Ambulatory Visit: Payer: Self-pay | Admitting: Family Medicine

## 2019-08-26 DIAGNOSIS — E119 Type 2 diabetes mellitus without complications: Secondary | ICD-10-CM

## 2019-08-30 ENCOUNTER — Other Ambulatory Visit: Payer: Self-pay | Admitting: Family Medicine

## 2019-08-30 ENCOUNTER — Encounter: Payer: Medicare HMO | Admitting: Family Medicine

## 2019-08-30 MED ORDER — CLONAZEPAM 1 MG PO TABS: ORAL_TABLET | ORAL | 1 refills | Status: DC

## 2019-08-30 NOTE — Telephone Encounter (Signed)
[facility] faxed refill request for the following medications:  clonazePAM (KLONOPIN) 1 MG tablet   Please advise.

## 2019-09-03 ENCOUNTER — Other Ambulatory Visit: Payer: Self-pay | Admitting: Family Medicine

## 2019-09-03 MED ORDER — CLONAZEPAM 1 MG PO TABS: ORAL_TABLET | ORAL | 0 refills | Status: DC

## 2019-09-23 ENCOUNTER — Telehealth: Payer: Self-pay | Admitting: Family Medicine

## 2019-09-23 NOTE — Telephone Encounter (Signed)
Copied from [place] [id number]. Topic: Medicare AWV >> Sep 23, 2019 10:00 AM Cher Nakai R wrote: Reason for CRM:  Left message for patient to call back and schedule Medicare Annual Wellness Visit (AWV) either virtually or in office.  No hx of AWVI eligible per [facility] as of 03/18/2009  please schedule at anytime with [facility] Health Advisor.

## 2019-10-18 ENCOUNTER — Other Ambulatory Visit: Payer: Self-pay | Admitting: Family Medicine

## 2019-10-18 DIAGNOSIS — N951 Menopausal and female climacteric states: Secondary | ICD-10-CM

## 2019-10-18 DIAGNOSIS — I10 Essential (primary) hypertension: Secondary | ICD-10-CM

## 2019-10-22 ENCOUNTER — Other Ambulatory Visit: Payer: Self-pay | Admitting: Family Medicine

## 2019-10-22 MED ORDER — CLONAZEPAM 1 MG PO TABS: ORAL_TABLET | ORAL | 1 refills | Status: AC

## 2019-10-22 NOTE — Progress Notes (Signed)
Faxed refill request from [employer] 

## 2019-11-24 ENCOUNTER — Telehealth: Payer: Self-pay | Admitting: Family Medicine

## 2019-11-24 NOTE — Telephone Encounter (Signed)
Copied from [place] [phone]. Topic: Medicare AWV >> Nov 24, 2019 12:25 PM Cher Nakai R wrote: Reason for CRM:  Left message for patient to call back and schedule Medicare Annual Wellness Visit (AWV) either virtually or in office.  No hx of AWV eligible as of 03/18/2009  Please schedule at anytime with [facility] Health Advisor.

## 2019-12-24 ENCOUNTER — Telehealth: Payer: Self-pay | Admitting: *Deleted

## 2019-12-24 NOTE — Chronic Care Management (AMB) (Signed)
  Chronic Care Management   Outreach Note  12/24/2019 Name: Misty Bishop [place] MRN: [id number] DOB: 31-Aug-1955  Misty Bishop [place] is a 64 y.o. year old female who is a primary care patient of Caryn Section, Kirstie Peri, MD. I reached out to [facility] by phone today in response to a referral sent by Misty Bishop's health plan.     An unsuccessful telephone outreach was attempted today. The patient was referred to the case management team for assistance with care management and care coordination.   Follow Up Plan: The care management team will reach out to the patient again over the next 7 days.  If patient returns call to provider office, please advise to call [place] at [phone].  [place] Management

## 2020-01-02 ENCOUNTER — Other Ambulatory Visit: Payer: Self-pay | Admitting: Family Medicine

## 2020-01-02 DIAGNOSIS — E119 Type 2 diabetes mellitus without complications: Secondary | ICD-10-CM

## 2020-01-03 ENCOUNTER — Telehealth: Payer: Self-pay | Admitting: Family Medicine

## 2020-01-03 DIAGNOSIS — N951 Menopausal and female climacteric states: Secondary | ICD-10-CM

## 2020-01-03 MED ORDER — ESTRADIOL 0.5 MG PO TABS: 0.5000 mg | ORAL_TABLET | Freq: Every day | ORAL | 1 refills | Status: DC

## 2020-01-03 NOTE — Telephone Encounter (Signed)
[facility] faxed refill request for the following medications:  estradiol (Estrogens-oral) Azithromycin   Please advise.  Thanks, [employer]

## 2020-01-04 NOTE — Chronic Care Management (AMB) (Signed)
  Chronic Care Management   Outreach Note  01/04/2020 Name: Misty Bishop MRN: [id number] DOB: [birth year]  Misty Bishop [place] is a 64 y.o. year old female who is a primary care patient of Caryn Section, Kirstie Peri, MD. I reached out to [facility] by phone today in response to a referral sent by Misty Bishop's health plan.     A second unsuccessful telephone outreach was attempted today. The patient was referred to the case management team for assistance with care management and care coordination.   Follow Up Plan: The care management team will reach out to the patient again over the next 7 days.  If patient returns call to provider office, please advise to call [place] at [phone].  [place] Management

## 2020-01-11 NOTE — Chronic Care Management (AMB) (Signed)
  Chronic Care Management   Outreach Note  01/11/2020 Name: Misty Bishop MRN: [id number] DOB: February 15, 1956  Misty Bishop is a 64 y.o. year old female who is a primary care patient of Caryn Section, Kirstie Peri, MD. I reached out to [facility] by phone today in response to a referral sent by Misty Bishop's health plan.     Third unsuccessful telephone outreach was attempted today. The patient was referred to the case management team for assistance with care management and care coordination. The patient's primary care provider has been notified of our unsuccessful attempts to make or maintain contact with the patient. The care management team is pleased to engage with this patient at any time in the future should he/she be interested in assistance from the care management team.   [place] Management  Direct Dial: [phone]

## 2020-01-13 ENCOUNTER — Other Ambulatory Visit: Payer: Self-pay | Admitting: Family Medicine

## 2020-01-13 DIAGNOSIS — M5137 Other intervertebral disc degeneration, lumbosacral region: Secondary | ICD-10-CM

## 2020-01-13 DIAGNOSIS — G8929 Other chronic pain: Secondary | ICD-10-CM

## 2020-01-13 MED ORDER — DICLOFENAC SODIUM 1 % EX GEL: 1.0000 "application " | Freq: Four times a day (QID) | CUTANEOUS | 4 refills | Status: DC | PRN

## 2020-01-13 MED ORDER — HYDROCODONE-ACETAMINOPHEN 5-325 MG PO TABS: 1.0000 | ORAL_TABLET | Freq: Four times a day (QID) | ORAL | 0 refills | Status: DC | PRN

## 2020-01-13 NOTE — Telephone Encounter (Signed)
Requested medication (s) are due for refill today:  yes  Requested medication (s) are on the active medication list:  yes  Future visit scheduled: no  Notes to clinic: Overdue for 6 month follow up    Requested Prescriptions  Pending Prescriptions Disp Refills   HYDROcodone-acetaminophen (NORCO/VICODIN) 5-325 MG tablet 120 tablet 0    Sig: Take 1-2 tablets by mouth every 6 (six) hours as needed for moderate pain.      Not Delegated - Analgesics:  Opioid Agonist Combinations Failed - 01/13/2020 12:34 PM      Failed - This refill cannot be delegated      Failed - Urine Drug Screen completed in last 360 days.      Failed - Valid encounter within last 6 months    Recent Outpatient Visits           6 months ago Type 2 diabetes mellitus without complication, without long-term current use of insulin [facility])   [facility] Birdie Sons, MD   2 years ago Exposure to measles virus   [place], Kirstie Peri, MD   2 years ago Chest pain, unspecified type   [facility] Birdie Sons, MD   2 years ago Annual physical exam   [facility] Birdie Sons, MD   3 years ago Type 2 diabetes mellitus without complication, without long-term current use of insulin [facility])   [facility] Birdie Sons, MD                diclofenac Sodium (VOLTAREN) 1 % GEL 300 g 4    Sig: Apply 1 application topically 4 (four) times daily as needed.      Analgesics:  Topicals Passed - 01/13/2020 12:34 PM      Passed - Valid encounter within last 12 months    Recent Outpatient Visits           6 months ago Type 2 diabetes mellitus without complication, without long-term current use of insulin [facility])   [facility] Birdie Sons, MD   2 years ago Exposure to measles virus   [facility] Birdie Sons, MD   2 years ago Chest pain, unspecified type   [facility] Birdie Sons, MD   2 years ago Annual physical exam   [facility] Birdie Sons, MD   3 years ago Type 2 diabetes mellitus without complication, without long-term current use of insulin [facility])   [facility] Birdie Sons, MD

## 2020-01-13 NOTE — Telephone Encounter (Signed)
Medication Refill - Medication: HYDROcodone-acetaminophen (NORCO/VICODIN) 5-325 MG tablet,diclofenac Sodium (VOLTAREN) 1 % GEL     Has the patient contacted their pharmacy? yes (Agent: If no, request that the patient contact the pharmacy for the refill.) (Agent: If yes, when and what did the pharmacy advise?)Contact PCP  Preferred Pharmacy (with phone number or street name):  [place], [place] Phone:  [phone]  Fax:  [phone]       Agent: Please be advised that RX refills may take up to 3 business days. We ask that you follow-up with your pharmacy.

## 2020-01-25 ENCOUNTER — Other Ambulatory Visit: Payer: Self-pay

## 2020-01-25 ENCOUNTER — Encounter: Payer: Self-pay | Admitting: Family Medicine

## 2020-01-25 ENCOUNTER — Ambulatory Visit (INDEPENDENT_AMBULATORY_CARE_PROVIDER_SITE_OTHER): Payer: Medicare HMO | Admitting: Family Medicine

## 2020-01-25 VITALS — BP 154/86 | HR 64 | Temp 98.4°F | Resp 16 | Wt 242.0 lb

## 2020-01-25 DIAGNOSIS — F32A Depression, unspecified: Secondary | ICD-10-CM | POA: Diagnosis not present

## 2020-01-25 DIAGNOSIS — Z23 Encounter for immunization: Secondary | ICD-10-CM | POA: Diagnosis not present

## 2020-01-25 DIAGNOSIS — I10 Essential (primary) hypertension: Secondary | ICD-10-CM | POA: Diagnosis not present

## 2020-01-25 DIAGNOSIS — F419 Anxiety disorder, unspecified: Secondary | ICD-10-CM | POA: Diagnosis not present

## 2020-01-25 DIAGNOSIS — E119 Type 2 diabetes mellitus without complications: Secondary | ICD-10-CM

## 2020-01-25 LAB — POCT GLYCOSYLATED HEMOGLOBIN (HGB A1C)
Est. average glucose Bld gHb Est-mCnc: 120
Hemoglobin A1C: 5.8 % — AB (ref 4.0–5.6)

## 2020-01-25 MED ORDER — VALACYCLOVIR HCL 1 G PO TABS
1000.0000 mg | ORAL_TABLET | Freq: Three times a day (TID) | ORAL | 3 refills | Status: DC | PRN
Start: 2020-01-25 — End: 2020-07-18

## 2020-01-25 MED ORDER — LOSARTAN POTASSIUM-HCTZ 100-25 MG PO TABS: 1.0000 | ORAL_TABLET | Freq: Every day | ORAL | 3 refills | Status: DC

## 2020-01-25 MED ORDER — ESCITALOPRAM OXALATE 20 MG PO TABS: ORAL_TABLET | ORAL | 2 refills | Status: DC

## 2020-01-25 NOTE — Progress Notes (Signed)
Established patient visit   Patient: Misty Bishop   DOB: 09/09/1955   64 y.o. Female  MRN: [id number] Visit Date: 01/25/2020  Today's healthcare provider: Lelon Huh, MD   Chief Complaint  Patient presents with  . Diabetes  . Hypertension  . Pain Management  . Anxiety   Subjective    HPI  Diabetes Mellitus Type II, Follow-up  Lab Results  Component Value Date   HGBA1C 5.8 (A) 01/25/2020   HGBA1C 5.7 (H) 07/12/2019   HGBA1C 7.0 (H) 08/13/2017   Wt Readings from Last 3 Encounters:  01/25/20 242 lb (109.8 kg)  07/06/19 238 lb 12.8 oz (108.3 kg)  09/05/17 244 lb 9.6 oz (110.9 kg)   Last seen for diabetes 07/06/2019.  Management since then includes continuing same medication. She reports good compliance with treatment. She is not having side effects.  Symptoms: No fatigue No foot ulcerations  No appetite changes No nausea  No paresthesia of the feet  No polydipsia  No polyuria No visual disturbances   No vomiting     Home blood sugar records: blood sugars are not checked  Episodes of hypoglycemia? No    Current insulin regiment: none Most Recent Eye Exam: not UTD Current exercise: none Current diet habits: in general, an "unhealthy" diet  Pertinent Labs: Lab Results  Component Value Date   CHOL 193 07/12/2019   HDL 42 07/12/2019   LDLCALC 123 (H) 07/12/2019   TRIG 156 (H) 07/12/2019   CHOLHDL 4.6 (H) 07/12/2019   Lab Results  Component Value Date   NA 142 07/12/2019   K 4.1 07/12/2019   CREATININE 0.77 07/12/2019   GFRNONAA 82 07/12/2019   GFRAA 95 07/12/2019   GLUCOSE 149 (H) 07/12/2019     ---------------------------------------------------------------------------------------------------  Hypertension, follow-up  BP Readings from Last 3 Encounters:  01/25/20 (!) 154/86  07/06/19 (!) 158/79  09/05/17 128/72   Wt Readings from Last 3 Encounters:  01/25/20 242 lb (109.8 kg)  07/06/19 238 lb 12.8 oz (108.3 kg)  09/05/17 244  lb 9.6 oz (110.9 kg)     She was last seen for hypertension 07/06/2019.  BP at that visit was 158/79. Management since that visit includes continue same medication.  She reports good compliance with treatment. She is not having side effects.  She is following a Regular diet. She is not exercising. She does smoke.  Use of agents associated with hypertension: NSAIDS.   Outside blood pressures are checked occasionally. . Symptoms: No chest pain No chest pressure  No palpitations No syncope  No dyspnea No orthopnea  No paroxysmal nocturnal dyspnea No lower extremity edema   Pertinent labs: Lab Results  Component Value Date   CHOL 193 07/12/2019   HDL 42 07/12/2019   LDLCALC 123 (H) 07/12/2019   TRIG 156 (H) 07/12/2019   CHOLHDL 4.6 (H) 07/12/2019   Lab Results  Component Value Date   NA 142 07/12/2019   K 4.1 07/12/2019   CREATININE 0.77 07/12/2019   GFRNONAA 82 07/12/2019   GFRAA 95 07/12/2019   GLUCOSE 149 (H) 07/12/2019     The 10-year ASCVD risk score Mikey Bussing DC Jr., et al., 2013) is: 32.2%   ---------------------------------------------------------------------------------------------------  Anxiety and depression, Follow-up  She was last seen for anxiety 6 months ago. Changes made at last visit include non; continue same medications.   She reports good compliance with treatment. She reports good tolerance of treatment. She is not having side effects.   She feels her  anxiety is moderate and Unchanged since last visit.  Symptoms: No chest pain No difficulty concentrating  No dizziness No fatigue  No feelings of losing control No insomnia  No irritable No palpitations  No panic attacks No racing thoughts  No shortness of breath No sweating  No tremors/shakes    GAD-7 Results No flowsheet data found.  PHQ-9 Scores PHQ9 SCORE ONLY 01/25/2020 07/06/2019 02/07/2017  PHQ-9 Total Score 14 14 16      ---------------------------------------------------------------------------------------------------  Follow up for chronic pain:  The patient was last seen for this 6 months ago. Changes made at last visit include none; continue same medication.  She reports good compliance with treatment. She feels that condition is Unchanged. She is not having side effects.   -----------------------------------------------------------------------------------------     Medications: Outpatient Medications Prior to Visit  Medication Sig  . Accu-Chek FastClix Lancets MISC CHECK BLOOD SUGAR EVERY DAY  . ACCU-CHEK SMARTVIEW test strip CHECK BLOOD SUGAR EVERY DAY  . albuterol (VENTOLIN HFA) 108 (90 Base) MCG/ACT inhaler Inhale 2 puffs into the lungs every 6 (six) hours as needed.  Marland Kitchen aspirin 81 MG EC tablet Take 81 mg by mouth daily.    Marland Kitchen atorvastatin (LIPITOR) 40 MG tablet TAKE 1 TABLET EVERY DAY  . b complex vitamins capsule Take 1 capsule by mouth Once PRN.    . clonazePAM (KLONOPIN) 1 MG tablet Take 1-2 tablets daily and 2 tablets at bedtime.  Marland Kitchen dexlansoprazole (DEXILANT) 60 MG capsule Take 1 capsule (60 mg total) daily by mouth.  . diclofenac (VOLTAREN) 75 MG EC tablet Take 1 tablet (75 mg total) by mouth 2 (two) times daily as needed.  . diclofenac Sodium (VOLTAREN) 1 % GEL Apply 1 application topically 4 (four) times daily as needed.  Marland Kitchen escitalopram (LEXAPRO) 20 MG tablet TAKE 1 TABLET EVERY DAY (NEED MD APPOINTMENT)  . estradiol (ESTRACE) 0.5 MG tablet Take 1 tablet (0.5 mg total) by mouth daily.  . fluticasone (FLONASE) 50 MCG/ACT nasal spray Place 2 sprays into both nostrils daily.  Marland Kitchen HYDROcodone-acetaminophen (NORCO/VICODIN) 5-325 MG tablet Take 1-2 tablets by mouth every 6 (six) hours as needed for moderate pain.  Marland Kitchen loratadine (CLARITIN) 10 MG tablet Take 1 tablet by mouth daily.  Marland Kitchen losartan-hydrochlorothiazide (HYZAAR) 50-12.5 MG tablet TAKE 1 TABLET EVERY DAY  . medroxyPROGESTERone  (PROVERA) 2.5 MG tablet Take 1 tablet (2.5 mg total) by mouth daily.  . meloxicam (MOBIC) 15 MG tablet Take 1 tablet (15 mg total) by mouth daily.  . metFORMIN (GLUCOPHAGE-XR) 500 MG 24 hr tablet TAKE 2 TABLETS DAILY WITH BREAKFAST.  . montelukast (SINGULAIR) 10 MG tablet TAKE 1 TABLET EVERY DAY  . nitroGLYCERIN (NITROSTAT) 0.4 MG SL tablet Place 1 tablet (0.4 mg total) under the tongue every 5 (five) minutes as needed for chest pain (Up to 3 tablets).  . Omega 3-6-9 Fatty Acids (OMEGA 3-6-9 COMPLEX) CAPS Take 1 capsule by mouth daily.  . Probiotic Product (PROBIOTIC PO) Take 3 tablets by mouth daily.  . ranitidine (ZANTAC) 150 MG capsule Take 1 capsule (150 mg total) by mouth 2 (two) times daily.  . sucralfate (CARAFATE) 1 g tablet TAKE 1 TABLET TWICE DAILY  . traMADol-acetaminophen (ULTRACET) 37.5-325 MG tablet Take 1 tablet by mouth every 6 (six) hours as needed.  . typhoid (VIVOTIF) DR capsule Take 1 capsule by mouth every other day.  . valACYclovir (VALTREX) 1000 MG tablet Take 1 tablet (1,000 mg total) by mouth 3 (three) times daily as needed. Every 8 hours for seven  days as needed  . vitamin C (ASCORBIC ACID) 250 MG tablet Take 4 tablets by mouth daily.   No facility-administered medications prior to visit.    Review of Systems  Constitutional: Negative for appetite change, chills, fatigue and fever.  Respiratory: Negative for chest tightness and shortness of breath.   Cardiovascular: Negative for chest pain and palpitations.  Gastrointestinal: Negative for abdominal pain, nausea and vomiting.  Neurological: Negative for dizziness and weakness.     Objective    BP (!) 154/86 (BP Location: Right Arm, Patient Position: Sitting, Cuff Size: Large)   Pulse 64   Temp 98.4 F (36.9 C) (Oral)   Resp 16   Wt 242 lb (109.8 kg)   BMI 37.90 kg/m    Physical Exam   General appearance: Obese female, cooperative and in no acute distress Head: Normocephalic, without obvious  abnormality, atraumatic Respiratory: Respirations even and unlabored, normal respiratory rate Extremities: All extremities are intact.  Skin: Skin color, texture, turgor normal. No rashes seen  Psych: Appropriate mood and affect. Neurologic: Mental status: Alert, oriented to person, place, and time, thought content appropriate.   Results for orders placed or performed in visit on 01/25/20  POCT HgB A1C  Result Value Ref Range   Hemoglobin A1C 5.8 (A) 4.0 - 5.6 %   Est. average glucose Bld gHb Est-mCnc 120     Assessment & Plan     1. Type 2 diabetes mellitus without complication, without long-term current use of insulin (HCC) Very well controlled.   2. Need for influenza vaccination  - Flu Vaccine QUAD 36+ mos IM (Fluarix/Fluzone)  3. Anxiety Doing very well current medication, continue- escitalopram (LEXAPRO) 20 MG tablet; Take one tablet dialy  Dispense: 90 tablet; Refill: 2  4. Depression, unspecified depression type continue- escitalopram (LEXAPRO) 20 MG tablet; Take one tablet dialy  Dispense: 90 tablet; Refill: 2  5. Benign essential HTN SBP somewhat elevated today. Need to exercise to work on weight loss and avoid sodium in diet. Continue current medications.    6. Encounter for immunization  - Pfizer SARS-COV-2 Vaccine  Counseled is due for colon cancer screening but she declined for the time being. Also advised due for cervical cancer screening.   refill - valACYclovir (VALTREX) 1000 MG tablet; Take 1 tablet (1,000 mg total) by mouth 3 (three) times daily as needed. Every 8 hours for seven days as needed  Dispense: 90 tablet; Refill: 3 - losartan-hydrochlorothiazide (HYZAAR) 100-25 MG tablet; Take 1 tablet by mouth daily.  Dispense: 90 tablet; Refill: 3   Future Appointments  Date Time Provider [place]  07/25/2020 10:20 AM Birdie Sons, MD [facility]         The entirety of the information documented in the History of Present Illness, Review of  Systems and Physical Exam were personally obtained by me. Portions of this information were initially documented by the CMA and reviewed by me for thoroughness and accuracy.      Lelon Huh, MD  [facility] [phone] (phone) [phone] (fax)  [place]

## 2020-02-24 ENCOUNTER — Telehealth: Payer: Self-pay

## 2020-02-24 NOTE — Telephone Encounter (Signed)
mychart message sent to patient

## 2020-03-20 ENCOUNTER — Other Ambulatory Visit: Payer: Self-pay | Admitting: Family Medicine

## 2020-03-20 DIAGNOSIS — M25519 Pain in unspecified shoulder: Secondary | ICD-10-CM

## 2020-03-20 DIAGNOSIS — G8929 Other chronic pain: Secondary | ICD-10-CM

## 2020-03-20 NOTE — Telephone Encounter (Signed)
Please review. Thanks!  

## 2020-03-20 NOTE — Telephone Encounter (Signed)
[facility] faxed refill request for the following medications:  diclofenac (VOLTAREN) 75 MG EC tablet  Please advise.

## 2020-03-21 MED ORDER — DICLOFENAC SODIUM 75 MG PO TBEC: 75.0000 mg | DELAYED_RELEASE_TABLET | Freq: Two times a day (BID) | ORAL | 3 refills | Status: DC | PRN

## 2020-03-30 ENCOUNTER — Telehealth: Payer: Self-pay | Admitting: Family Medicine

## 2020-03-30 NOTE — Telephone Encounter (Signed)
Copied from [place] [id number]. Topic: Medicare AWV >> Mar 30, 2020 11:08 AM Cher Nakai R wrote: Reason for CRM:   No answer unable to leave message for patient to call back and schedule Medicare Annual Wellness Visit (AWV) in office.   If not able to come in office, please offer to do virtually.   No hx of AWV - AWV-I eligible as of 03/18/2009 awvi  Please schedule at anytime with [facility] Health Advisor.   40 minute appointment  Any questions, please contact me at [phone]

## 2020-04-26 ENCOUNTER — Telehealth: Payer: Self-pay | Admitting: Family Medicine

## 2020-04-26 NOTE — Telephone Encounter (Signed)
Copied from [place] [id number]. Topic: Medicare AWV >> Apr 26, 2020 11:41 AM Cher Nakai R wrote: Reason for CRM:  Left message for patient to call back and schedule Medicare Annual Wellness Visit (AWV) in office.   If not able to come in the office, please offer to do virtually or by telephone.   No hx of AWV - AWV-I eligible as of 03/18/2009  Please schedule at anytime with [facility] Health Advisor.   40 minute appointment  Any questions, please contact me at [phone]

## 2020-05-15 ENCOUNTER — Other Ambulatory Visit: Payer: Self-pay | Admitting: Family Medicine

## 2020-06-16 ENCOUNTER — Other Ambulatory Visit: Payer: Self-pay | Admitting: Family Medicine

## 2020-06-16 DIAGNOSIS — E119 Type 2 diabetes mellitus without complications: Secondary | ICD-10-CM

## 2020-07-18 ENCOUNTER — Other Ambulatory Visit: Payer: Self-pay | Admitting: Family Medicine

## 2020-07-18 DIAGNOSIS — N951 Menopausal and female climacteric states: Secondary | ICD-10-CM

## 2020-07-18 DIAGNOSIS — F419 Anxiety disorder, unspecified: Secondary | ICD-10-CM

## 2020-07-18 DIAGNOSIS — M5137 Other intervertebral disc degeneration, lumbosacral region: Secondary | ICD-10-CM

## 2020-07-18 DIAGNOSIS — F32A Depression, unspecified: Secondary | ICD-10-CM

## 2020-07-18 MED ORDER — ESCITALOPRAM OXALATE 20 MG PO TABS: ORAL_TABLET | ORAL | 0 refills | Status: DC

## 2020-07-18 MED ORDER — VALACYCLOVIR HCL 1 G PO TABS
1000.0000 mg | ORAL_TABLET | Freq: Three times a day (TID) | ORAL | 1 refills | Status: DC | PRN
Start: 2020-07-18 — End: 2021-05-14

## 2020-07-18 MED ORDER — ESTRADIOL 0.5 MG PO TABS: 0.5000 mg | ORAL_TABLET | Freq: Every day | ORAL | 1 refills | Status: DC

## 2020-07-18 NOTE — Telephone Encounter (Signed)
Requested Prescriptions  Pending Prescriptions Disp Refills  . valACYclovir (VALTREX) 1000 MG tablet 90 tablet 1    Sig: Take 1 tablet (1,000 mg total) by mouth 3 (three) times daily as needed. Every 8 hours for seven days as needed     Antimicrobials:  Antiviral Agents - Anti-Herpetic Passed - 07/18/2020  6:02 PM      Passed - Valid encounter within last 12 months    Recent Outpatient Visits          5 months ago Type 2 diabetes mellitus without complication, without long-term current use of insulin [facility])   [facility] Birdie Sons, MD   1 year ago Type 2 diabetes mellitus without complication, without long-term current use of insulin [facility])   [facility] Birdie Sons, MD   2 years ago Exposure to measles virus   [facility] Birdie Sons, MD   2 years ago Chest pain, unspecified type   [facility] Birdie Sons, MD   3 years ago Annual physical exam   [facility] Birdie Sons, MD      Future Appointments            In 4 weeks Fisher, Kirstie Peri, MD [facility], [place]           . HYDROcodone-acetaminophen (NORCO/VICODIN) 5-325 MG tablet 120 tablet 0    Sig: Take 1-2 tablets by mouth every 6 (six) hours as needed for moderate pain.     Not Delegated - Analgesics:  Opioid Agonist Combinations Failed - 07/18/2020  6:02 PM      Failed - This refill cannot be delegated      Failed - Urine Drug Screen completed in last 360 days      Passed - Valid encounter within last 6 months    Recent Outpatient Visits          5 months ago Type 2 diabetes mellitus without complication, without long-term current use of insulin ([address])   [facility] Birdie Sons, MD   1 year ago Type 2 diabetes mellitus without complication, without long-term current use of insulin [facility])   [facility] Birdie Sons, MD   2 years ago Exposure to measles virus   [place], Kirstie Peri, MD   2 years ago Chest pain, unspecified type   [facility] Birdie Sons, MD   3 years ago Annual physical exam   [place], MD      Future Appointments            In 4 weeks Fisher, Kirstie Peri, MD [facility], [facility]           . estradiol (ESTRACE) 0.5 MG tablet 90 tablet 1    Sig: Take 1 tablet (0.5 mg total) by mouth daily.     OB/GYN:  Estrogens Failed - 07/18/2020  6:02 PM      Failed - Mammogram is up-to-date per Health Maintenance      Failed - Last BP in normal range    BP Readings from Last 1 Encounters:  01/25/20 (!) 154/86         Passed - Valid encounter within last 12 months    Recent Outpatient Visits          5 months ago Type 2 diabetes mellitus without complication, without long-term current use of insulin ([place])   [facility] Birdie Sons, MD   1 year ago Type 2 diabetes mellitus without complication, without long-term current use of insulin [facility])   [facility] Birdie Sons, MD   2 years ago Exposure to measles virus   [place], Kirstie Peri, MD   2 years ago Chest pain, unspecified type   [facility] Birdie Sons, MD   3 years ago Annual physical exam   [facility] Birdie Sons, MD      Future Appointments            In 4 weeks Fisher, Kirstie Peri, MD [facility], [facility]           . escitalopram (LEXAPRO) 20 MG tablet 90 tablet 2    Sig: Take one tablet dialy     Psychiatry:  Antidepressants - SSRI Passed - 07/18/2020  6:02 PM      Passed - Completed PHQ-2 or PHQ-9 in the last 360 days      Passed - Valid encounter within last 6 months    Recent Outpatient Visits          5 months ago Type 2 diabetes mellitus without complication, without long-term current use of insulin ([place])   [facility] Birdie Sons, MD   1 year ago  Type 2 diabetes mellitus without complication, without long-term current use of insulin [facility])   [facility] Birdie Sons, MD   2 years ago Exposure to measles virus   [place], Kirstie Peri, MD   2 years ago Chest pain, unspecified type   [facility] Birdie Sons, MD   3 years ago Annual physical exam   [facility] Birdie Sons, MD      Future Appointments            In 4 weeks Fisher, Kirstie Peri, MD [facility], [place]

## 2020-07-18 NOTE — Telephone Encounter (Signed)
Requested medications are due for refill today.  yes  Requested medications are on the active medications list.  yes  Last refill. 01/13/2020  Future visit scheduled.   yes  Notes to clinic.  Medication not delegated.

## 2020-07-18 NOTE — Telephone Encounter (Signed)
Also requesting estradiol (ESTRACE) 0.5 MG tablet  Pt needs new Rx

## 2020-07-18 NOTE — Telephone Encounter (Signed)
Medication Refill - Medication: HYDROcodone-acetaminophen (NORCO/VICODIN) 5-325 MG tablet valACYclovir (VALTREX) 1000 MG tablet escitalopram (LEXAPRO) 20 MG tablet   Requesting 3 month supply for meds that allow   Has the patient contacted their pharmacy? Yes.   (Agent: If no, request that the patient contact the pharmacy for the refill.) (Agent: If yes, when and what did the pharmacy advise?)  Preferred Pharmacy (with phone number or street name):  [facility], [address]  [place] [address]  Phone: [phone] Fax: [phone]    Agent: Please be advised that RX refills may take up to 3 business days. We ask that you follow-up with your pharmacy.

## 2020-07-19 MED ORDER — HYDROCODONE-ACETAMINOPHEN 5-325 MG PO TABS: 1.0000 | ORAL_TABLET | Freq: Four times a day (QID) | ORAL | 0 refills | Status: DC | PRN

## 2020-07-21 ENCOUNTER — Telehealth: Payer: Self-pay | Admitting: Family Medicine

## 2020-07-21 DIAGNOSIS — M5137 Other intervertebral disc degeneration, lumbosacral region: Secondary | ICD-10-CM

## 2020-07-21 NOTE — Telephone Encounter (Signed)
Please advise 

## 2020-07-21 NOTE — Telephone Encounter (Signed)
Bri called from [facility] requesting 56 for a 7 day supply   HYDROcodone-acetaminophen (NORCO/VICODIN) 5-325 MG tablet  Due to insurance purposes, pt is apparently considered "Opoid naive"   Best contact: [phone]  Reference: [id number]

## 2020-07-24 MED ORDER — HYDROCODONE-ACETAMINOPHEN 5-325 MG PO TABS: 1.0000 | ORAL_TABLET | Freq: Four times a day (QID) | ORAL | 0 refills | Status: DC | PRN

## 2020-07-25 ENCOUNTER — Ambulatory Visit: Payer: Self-pay | Admitting: Family Medicine

## 2020-07-28 ENCOUNTER — Other Ambulatory Visit: Payer: Self-pay | Admitting: Family Medicine

## 2020-08-07 ENCOUNTER — Telehealth: Payer: Self-pay | Admitting: Family Medicine

## 2020-08-07 DIAGNOSIS — M5137 Other intervertebral disc degeneration, lumbosacral region: Secondary | ICD-10-CM

## 2020-08-07 NOTE — Telephone Encounter (Signed)
We sent refill for 120 on 07-21-2020, [employer] called and said insurance would only cover 79.

## 2020-08-07 NOTE — Telephone Encounter (Signed)
Pt is calling and only received hydrocodone #56 for 7 days supply. Pt would like to know the reason for getting #56 she normal get 120 for 1 month supply. [employer] mail order pharm. Pt will need another refill

## 2020-08-08 NOTE — Telephone Encounter (Signed)
Does she want this prescription sent to [facility] or a local pharmacy.

## 2020-08-08 NOTE — Telephone Encounter (Signed)
Pt is calling checking on the status of refill 

## 2020-08-08 NOTE — Telephone Encounter (Signed)
Spoke with patients daughter who is listed on DPR and advised her of message below, she states that patient has been in tremendous and states that she just got her in bed after she has been up complaining of pain, she wants to know if another prescription can be sent in since she has already finished her other tablets? KW  CB# [phone]

## 2020-08-09 MED ORDER — HYDROCODONE-ACETAMINOPHEN 5-325 MG PO TABS: 1.0000 | ORAL_TABLET | Freq: Four times a day (QID) | ORAL | 0 refills | Status: DC | PRN

## 2020-08-09 NOTE — Telephone Encounter (Signed)
Pt would like to use [employer].    Thanks,   -Mickel Baas

## 2020-08-15 ENCOUNTER — Encounter: Payer: Self-pay | Admitting: Family Medicine

## 2020-08-15 ENCOUNTER — Ambulatory Visit (INDEPENDENT_AMBULATORY_CARE_PROVIDER_SITE_OTHER): Payer: Medicare HMO | Admitting: Family Medicine

## 2020-08-15 ENCOUNTER — Other Ambulatory Visit: Payer: Self-pay

## 2020-08-15 VITALS — BP 136/83 | HR 60 | Temp 97.2°F | Resp 16 | Wt 236.0 lb

## 2020-08-15 DIAGNOSIS — I700 Atherosclerosis of aorta: Secondary | ICD-10-CM

## 2020-08-15 DIAGNOSIS — J42 Unspecified chronic bronchitis: Secondary | ICD-10-CM

## 2020-08-15 DIAGNOSIS — M25519 Pain in unspecified shoulder: Secondary | ICD-10-CM

## 2020-08-15 DIAGNOSIS — Z1211 Encounter for screening for malignant neoplasm of colon: Secondary | ICD-10-CM | POA: Diagnosis not present

## 2020-08-15 DIAGNOSIS — E119 Type 2 diabetes mellitus without complications: Secondary | ICD-10-CM

## 2020-08-15 DIAGNOSIS — G8929 Other chronic pain: Secondary | ICD-10-CM

## 2020-08-15 DIAGNOSIS — R079 Chest pain, unspecified: Secondary | ICD-10-CM

## 2020-08-15 DIAGNOSIS — F339 Major depressive disorder, recurrent, unspecified: Secondary | ICD-10-CM | POA: Diagnosis not present

## 2020-08-15 LAB — POCT GLYCOSYLATED HEMOGLOBIN (HGB A1C)
Est. average glucose Bld gHb Est-mCnc: 117
Hemoglobin A1C: 5.7 % — AB (ref 4.0–5.6)

## 2020-08-15 MED ORDER — MELOXICAM 15 MG PO TABS: 15.0000 mg | ORAL_TABLET | Freq: Every day | ORAL | 1 refills | Status: DC

## 2020-08-15 NOTE — Progress Notes (Signed)
Established patient visit   Patient: Misty Bishop   DOB: Feb 09, 1956   65 y.o. Female  MRN: [id number] Visit Date: 08/15/2020  Today's healthcare provider: Lelon Huh, MD   Chief Complaint  Patient presents with  . Diabetes  . Anxiety  . Hypertension   Subjective    HPI  Diabetes Mellitus Type II, Follow-up  Lab Results  Component Value Date   HGBA1C 5.8 (A) 01/25/2020   HGBA1C 5.7 (H) 07/12/2019   HGBA1C 7.0 (H) 08/13/2017   Wt Readings from Last 3 Encounters:  08/15/20 236 lb (107 kg)  01/25/20 242 lb (109.8 kg)  07/06/19 238 lb 12.8 oz (108.3 kg)   Last seen for diabetes 6 months ago.  Management since then includes continuing same medication. She reports good compliance with treatment. She is not having side effects.  Symptoms: No fatigue No foot ulcerations  No appetite changes No nausea  No paresthesia of the feet  No polydipsia  No polyuria No visual disturbances   No vomiting     Home blood sugar records: blood sugars are not checked  Episodes of hypoglycemia? No    Current insulin regiment: none Most Recent Eye Exam: not UTD Current exercise: none Current diet habits: in general, an "unhealthy" diet  Pertinent Labs: Lab Results  Component Value Date   CHOL 193 07/12/2019   HDL 42 07/12/2019   LDLCALC 123 (H) 07/12/2019   TRIG 156 (H) 07/12/2019   CHOLHDL 4.6 (H) 07/12/2019   Lab Results  Component Value Date   NA 142 07/12/2019   K 4.1 07/12/2019   CREATININE 0.77 07/12/2019   GFRNONAA 82 07/12/2019   GFRAA 95 07/12/2019   GLUCOSE 149 (H) 07/12/2019     ---------------------------------------------------------------------------------------------------  Anxiety AND Depression, Follow-up  She was last seen for anxiety 6 months ago. Changes made at last visit include none; continue same dose of Lexapro.   She reports good compliance with treatment. She reports good tolerance of treatment. She is not having side  effects.   She feels her anxiety is moderate and Unchanged since last visit.  Symptoms: Yes chest pain No difficulty concentrating  No dizziness Yes fatigue  No feelings of losing control No insomnia  No irritable No palpitations  No panic attacks No racing thoughts  No shortness of breath No sweating  No tremors/shakes    GAD-7 Results No flowsheet data found.  PHQ-9 Scores PHQ9 SCORE ONLY 08/15/2020 01/25/2020 07/06/2019  PHQ-9 Total Score 14 14 14     ---------------------------------------------------------------------------------------------------  Hypertension, follow-up  BP Readings from Last 3 Encounters:  08/15/20 136/83  01/25/20 (!) 154/86  07/06/19 (!) 158/79   Wt Readings from Last 3 Encounters:  08/15/20 236 lb (107 kg)  01/25/20 242 lb (109.8 kg)  07/06/19 238 lb 12.8 oz (108.3 kg)     She was last seen for hypertension 6 months ago.  BP at that visit was 154/86. Management since that visit includes advising patient to work on exercise, weight loss and avoiding sodium in diet. Continue current medications.    She reports fair compliance with treatment. She is not having side effects.  She is following a Regular diet. She is not exercising. She does smoke.  Use of agents associated with hypertension: NSAIDS.   Outside blood pressures are not checked. Symptoms: No chest pain No chest pressure  No palpitations No syncope  No dyspnea No orthopnea  No paroxysmal nocturnal dyspnea No lower extremity edema   Pertinent labs:  Lab Results  Component Value Date   CHOL 193 07/12/2019   HDL 42 07/12/2019   LDLCALC 123 (H) 07/12/2019   TRIG 156 (H) 07/12/2019   CHOLHDL 4.6 (H) 07/12/2019   Lab Results  Component Value Date   NA 142 07/12/2019   K 4.1 07/12/2019   CREATININE 0.77 07/12/2019   GFRNONAA 82 07/12/2019   GFRAA 95 07/12/2019   GLUCOSE 149 (H) 07/12/2019     The 10-year ASCVD risk score Mikey Bussing DC Jr., et al., 2013) is: 27.7%    ---------------------------------------------------------------------------------------------------  Follow up for chronic pain:  The patient was last seen for this 6 months ago. Changes made at last visit include none.  She reports good compliance with treatment. She feels that condition is Unchanged. She is not having side effects.   -----------------------------------------------------------------------------------------      Medications: Outpatient Medications Prior to Visit  Medication Sig  . Accu-Chek FastClix Lancets MISC CHECK BLOOD SUGAR EVERY DAY  . ACCU-CHEK SMARTVIEW test strip CHECK BLOOD SUGAR EVERY DAY  . albuterol (VENTOLIN HFA) 108 (90 Base) MCG/ACT inhaler Inhale 2 puffs into the lungs every 6 (six) hours as needed.  Marland Kitchen aspirin 81 MG EC tablet Take 81 mg by mouth daily.  Marland Kitchen atorvastatin (LIPITOR) 40 MG tablet TAKE 1 TABLET EVERY DAY  . b complex vitamins capsule Take 1 capsule by mouth Once PRN.  . clonazePAM (KLONOPIN) 1 MG tablet Take 1-2 tablets daily and 2 tablets at bedtime.  Marland Kitchen dexlansoprazole (DEXILANT) 60 MG capsule Take 1 capsule (60 mg total) daily by mouth.  . diclofenac (VOLTAREN) 75 MG EC tablet Take 1 tablet (75 mg total) by mouth 2 (two) times daily as needed.  . diclofenac Sodium (VOLTAREN) 1 % GEL Apply 1 application topically 4 (four) times daily as needed.  Marland Kitchen escitalopram (LEXAPRO) 20 MG tablet Take one tablet dialy  . estradiol (ESTRACE) 0.5 MG tablet Take 1 tablet (0.5 mg total) by mouth daily.  . fluticasone (FLONASE) 50 MCG/ACT nasal spray Place 2 sprays into both nostrils daily.  Marland Kitchen HYDROcodone-acetaminophen (NORCO/VICODIN) 5-325 MG tablet Take 1-2 tablets by mouth every 6 (six) hours as needed for moderate pain.  Marland Kitchen loratadine (CLARITIN) 10 MG tablet Take 1 tablet by mouth daily.  Marland Kitchen losartan-hydrochlorothiazide (HYZAAR) 100-25 MG tablet Take 1 tablet by mouth daily.  . medroxyPROGESTERone (PROVERA) 2.5 MG tablet TAKE 1 TABLET EVERY DAY  .  metFORMIN (GLUCOPHAGE-XR) 500 MG 24 hr tablet TAKE 2 TABLETS EVERY DAY WITH BREAKFAST  . montelukast (SINGULAIR) 10 MG tablet TAKE 1 TABLET EVERY DAY  . nitroGLYCERIN (NITROSTAT) 0.4 MG SL tablet Place 1 tablet (0.4 mg total) under the tongue every 5 (five) minutes as needed for chest pain (Up to 3 tablets).  . Omega 3-6-9 Fatty Acids (OMEGA 3-6-9 COMPLEX) CAPS Take 1 capsule by mouth daily.  . Probiotic Product (PROBIOTIC PO) Take 3 tablets by mouth daily.  . ranitidine (ZANTAC) 150 MG capsule Take 1 capsule (150 mg total) by mouth 2 (two) times daily.  . sucralfate (CARAFATE) 1 g tablet TAKE 1 TABLET TWICE DAILY  . traMADol-acetaminophen (ULTRACET) 37.5-325 MG tablet Take 1 tablet by mouth every 6 (six) hours as needed.  . valACYclovir (VALTREX) 1000 MG tablet Take 1 tablet (1,000 mg total) by mouth 3 (three) times daily as needed. Every 8 hours for seven days as needed  . vitamin C (ASCORBIC ACID) 250 MG tablet Take 4 tablets by mouth daily.  . meloxicam (MOBIC) 15 MG tablet Take 1 tablet (15  mg total) by mouth daily. (Patient not taking: Reported on 08/15/2020)  . [DISCONTINUED] typhoid (VIVOTIF) DR capsule Take 1 capsule by mouth every other day. (Patient not taking: Reported on 08/15/2020)   No facility-administered medications prior to visit.    Review of Systems  Constitutional: Positive for fatigue. Negative for appetite change, chills and fever.  Respiratory: Negative for chest tightness and shortness of breath.   Cardiovascular: Negative for chest pain (occasional chect pain once a month) and palpitations.  Gastrointestinal: Negative for abdominal pain, nausea and vomiting.  Musculoskeletal: Positive for myalgias (left arm).  Neurological: Negative for dizziness and weakness.  Psychiatric/Behavioral: Positive for dysphoric mood.       Objective    BP 136/83 (BP Location: Left Arm, Patient Position: Sitting, Cuff Size: Large)   Pulse 60   Temp (!) 97.2 F (36.2 C) (Temporal)    Resp 16   Wt 236 lb (107 kg)   BMI 36.96 kg/m     Physical Exam    General: Appearance:    Obese female in no acute distress  Eyes:    PERRL, conjunctiva/corneas clear, EOM's intact       Lungs:     Clear to auscultation bilaterally, respirations unlabored  Heart:    Normal heart rate. Normal rhythm. No murmurs, rubs, or gallops.   MS:   All extremities are intact.   Neurologic:   Awake, alert, oriented x 3. No apparent focal neurological           defect.        Results for orders placed or performed in visit on 08/15/20  POCT HgB A1C  Result Value Ref Range   Hemoglobin A1C 5.7 (A) 4.0 - 5.6 %   Est. average glucose Bld gHb Est-mCnc 117       Assessment & Plan     1. Type 2 diabetes mellitus without complication, without long-term current use of insulin (HCC) Very well controlled. She is having some side effects from 1000mg  mg daily metformin. Will change next refill to 75- - Comprehensive metabolic panel - CBC  2. Aortic atherosclerosis ([place]) She is tolerating atorvastatin well with no adverse effects.   - Lipid panel  3. Chronic bronchitis, unspecified chronic bronchitis type ([place]) Doing well with prn albutaerol.   4. Depression, recurrent (HCC) Continue current dose of escitalopram.   5. Colon cancer screening  - Cologuard  - meloxicam (MOBIC) 15 MG tablet; Take 1 tablet (15 mg total) by mouth daily.  Dispense: 30 tablet; Refill: 1         The entirety of the information documented in the History of Present Illness, Review of Systems and Physical Exam were personally obtained by me. Portions of this information were initially documented by the CMA and reviewed by me for thoroughness and accuracy.      Lelon Huh, MD  [facility] [phone] (phone) [phone] (fax)  [place]

## 2020-08-15 NOTE — Patient Instructions (Addendum)
   Let me know when you are ready for prescriptions to be refilled and sent to [facility].   Please call the [facility] at [facility] at [phone] to schedule your mammogram.  Please go to the lab draw station in [address] on the second floor of [facility]  when you are fasting for 8 hours Please contact your eyecare professional to schedule a routine eye exam  . . Normal hours are 8:00am to 11:30am and 1:00pm to 4:00pm Monday through Friday

## 2020-08-16 ENCOUNTER — Other Ambulatory Visit: Payer: Self-pay | Admitting: Family Medicine

## 2020-08-16 DIAGNOSIS — G8929 Other chronic pain: Secondary | ICD-10-CM

## 2020-08-16 DIAGNOSIS — E119 Type 2 diabetes mellitus without complications: Secondary | ICD-10-CM

## 2020-08-16 DIAGNOSIS — L918 Other hypertrophic disorders of the skin: Secondary | ICD-10-CM

## 2020-08-16 DIAGNOSIS — M25519 Pain in unspecified shoulder: Secondary | ICD-10-CM

## 2020-08-16 DIAGNOSIS — R079 Chest pain, unspecified: Secondary | ICD-10-CM

## 2020-08-16 MED ORDER — NITROGLYCERIN 0.4 MG SL SUBL: 0.4000 mg | SUBLINGUAL_TABLET | SUBLINGUAL | 2 refills | Status: DC | PRN

## 2020-08-16 MED ORDER — DICLOFENAC SODIUM 1 % EX GEL: 1.0000 "application " | Freq: Four times a day (QID) | CUTANEOUS | 4 refills | Status: DC | PRN

## 2020-08-16 MED ORDER — METFORMIN HCL ER 500 MG PO TB24: ORAL_TABLET | ORAL | 0 refills | Status: DC

## 2020-08-16 MED ORDER — RANITIDINE HCL 150 MG PO CAPS: 150.0000 mg | ORAL_CAPSULE | Freq: Two times a day (BID) | ORAL | 4 refills | Status: DC

## 2020-08-16 NOTE — Telephone Encounter (Signed)
Medication Refill - Medication: diclofenac Sodium (VOLTAREN) 1 % GEL nitroGLYCERIN (NITROSTAT) 0.4 MG SL tablet metFORMIN (GLUCOPHAGE-XR) 500 MG 24 hr tablet ranitidine (ZANTAC) 150 MG capsule    Preferred Pharmacy (with phone number or street name): [place], [address]  Agent: Please be advised that RX refills may take up to 3 business days. We ask that you follow-up with your pharmacy.

## 2020-08-16 NOTE — Telephone Encounter (Signed)
Notes to clinic: Review for new script Medication requested have expired  Patient also would like the  Voltaren Gel    Requested Prescriptions  Pending Prescriptions Disp Refills   ranitidine (ZANTAC) 150 MG capsule 180 capsule 4    Sig: Take 1 capsule (150 mg total) by mouth 2 (two) times daily.      Off-Protocol Failed - 08/16/2020  2:22 PM      Failed - Medication not assigned to a protocol, review manually.      Passed - Valid encounter within last 12 months    Recent Outpatient Visits           Yesterday Type 2 diabetes mellitus without complication, without long-term current use of insulin [facility])   [facility] Birdie Sons, MD   6 months ago Type 2 diabetes mellitus without complication, without long-term current use of insulin ([place])   [facility] Birdie Sons, MD   1 year ago Type 2 diabetes mellitus without complication, without long-term current use of insulin [facility])   [facility] Birdie Sons, MD   2 years ago Exposure to measles virus   [facility] Birdie Sons, MD   3 years ago Chest pain, unspecified type   [facility] Birdie Sons, MD       Future Appointments             In 4 months Fisher, Kirstie Peri, MD [facility]               nitroGLYCERIN (NITROSTAT) 0.4 MG SL tablet 30 tablet 2    Sig: Place 1 tablet (0.4 mg total) under the tongue every 5 (five) minutes as needed for chest pain (Up to 3 tablets).      Cardiovascular:  Nitrates Passed - 08/16/2020  2:22 PM      Passed - Last BP in normal range    BP Readings from Last 1 Encounters:  08/15/20 136/83          Passed - Last Heart Rate in normal range    Pulse Readings from Last 1 Encounters:  08/15/20 60          Passed - Valid encounter within last 12 months    Recent Outpatient Visits           Yesterday Type 2 diabetes mellitus without complication, without long-term  current use of insulin ([place])   [facility] Birdie Sons, MD   6 months ago Type 2 diabetes mellitus without complication, without long-term current use of insulin ([place])   [facility] Birdie Sons, MD   1 year ago Type 2 diabetes mellitus without complication, without long-term current use of insulin ([place])   [facility] Birdie Sons, MD   2 years ago Exposure to measles virus   [place], Kirstie Peri, MD   3 years ago Chest pain, unspecified type   [facility] Birdie Sons, MD       Future Appointments             In 4 months Fisher, Kirstie Peri, MD [facility]              Signed Prescriptions Disp Refills   metFORMIN (GLUCOPHAGE-XR) 500 MG 24 hr tablet 180 tablet 0    Sig: TAKE 2 TABLETS EVERY DAY WITH BREAKFAST      Endocrinology:  Diabetes -  Biguanides Failed - 08/16/2020  2:22 PM      Failed - Cr in normal range and within 360 days    Creatinine  Date Value Ref Range Status  09/02/2011 1.01 0.60 - 1.30 mg/dL Final   Creatinine, Ser  Date Value Ref Range Status  07/12/2019 0.77 0.57 - 1.00 mg/dL Final          Failed - eGFR in normal range and within 360 days    EGFR (African American)  Date Value Ref Range Status  09/02/2011 >60  Final   GFR calc Af Amer  Date Value Ref Range Status  07/12/2019 95 >59 mL/min/1.73 Final    Comment:    **[facility] currently reports eGFR in compliance with the current**   recommendations of the [employer]. [facility] will   update reporting as new guidelines are published from the NKF-ASN   Task force.    EGFR (Non-African Amer.)  Date Value Ref Range Status  09/02/2011 >60  Final    Comment:    eGFR values <62m/min/1.73 m2 may be an indication of chronic kidney disease (CKD). Calculated eGFR is useful in patients with stable renal function. The eGFR calculation will not be reliable in acutely ill  patients when serum creatinine is changing rapidly. It is not useful in  patients on dialysis. The eGFR calculation may not be applicable to patients at the low and high extremes of body sizes, pregnant women, and vegetarians.    GFR calc non Af Amer  Date Value Ref Range Status  07/12/2019 82 >59 mL/min/1.73 Final          Passed - HBA1C is between 0 and 7.9 and within 180 days    Hemoglobin A1C  Date Value Ref Range Status  08/15/2020 5.7 (A) 4.0 - 5.6 % Final  09/02/2011 5.8 4.2 - 6.3 % Final    Comment:    The American Diabetes Association recommends that a primary goal of therapy should be <7% and that physicians should reevaluate the treatment regimen in patients with HbA1c values consistently >8%.    Hgb A1c MFr Bld  Date Value Ref Range Status  07/12/2019 5.7 (H) 4.8 - 5.6 % Final    Comment:             Prediabetes: 5.7 - 6.4          Diabetes: >6.4          Glycemic control for adults with diabetes: <7.0           Passed - Valid encounter within last 6 months    Recent Outpatient Visits           Yesterday Type 2 diabetes mellitus without complication, without long-term current use of insulin ([facility]   6 months ago Type 2 diabetes mellitus without complication, without long-term current use of insulin ([place]   [facility]   1 year ago Type 2 diabetes mellitus without complication, without long-term current use of insulin ([facility]   [facility]   2 years ago Exposure to measles virus   [facility]   3 years ago Chest pain, unspecified type   [facility]       Future Appointments             In 4 months Fisher, D[facility]irstie Peri [facility]

## 2020-08-16 NOTE — Telephone Encounter (Signed)
Please review. Patient is wanting Rx for voltaren gel. Thanks!

## 2020-08-25 ENCOUNTER — Other Ambulatory Visit: Payer: Self-pay | Admitting: Family Medicine

## 2020-08-25 NOTE — Telephone Encounter (Signed)
Medication Refill - Medication: meloxicam (MOBIC) 15 MG tablet  per insurance the Statin is preferred   Has the patient contacted their pharmacy? Yes.   (Agent: If no, request that the patient contact the pharmacy for the refill.) (Agent: If yes, when and what did the pharmacy advise?)  Preferred Pharmacy (with phone number or street name):  [place], [address]  [place] [address]  Phone: [phone] Fax: [phone]    Agent: Please be advised that RX refills may take up to 3 business days. We ask that you follow-up with your pharmacy.

## 2020-08-26 MED ORDER — MELOXICAM 15 MG PO TABS: 15.0000 mg | ORAL_TABLET | Freq: Every day | ORAL | 1 refills | Status: DC

## 2020-08-26 NOTE — Telephone Encounter (Signed)
Change of pharmacy

## 2020-08-31 ENCOUNTER — Other Ambulatory Visit: Payer: Self-pay | Admitting: Family Medicine

## 2020-08-31 DIAGNOSIS — E119 Type 2 diabetes mellitus without complications: Secondary | ICD-10-CM

## 2020-09-01 NOTE — Telephone Encounter (Signed)
Erroneous encounter

## 2020-09-06 ENCOUNTER — Other Ambulatory Visit: Payer: Self-pay | Admitting: Family Medicine

## 2020-09-06 DIAGNOSIS — F419 Anxiety disorder, unspecified: Secondary | ICD-10-CM

## 2020-09-06 DIAGNOSIS — F32A Depression, unspecified: Secondary | ICD-10-CM

## 2020-09-06 NOTE — Telephone Encounter (Signed)
Requested Prescriptions  Pending Prescriptions Disp Refills  . escitalopram (LEXAPRO) 20 MG tablet [Pharmacy Med Name: ESCITALOPRAM OXALATE 20 MG Tablet] 90 tablet 0    Sig: TAKE 1 TABLET EVERY DAY     Psychiatry:  Antidepressants - SSRI Passed - 09/06/2020  6:31 AM      Passed - Completed PHQ-2 or PHQ-9 in the last 360 days      Passed - Valid encounter within last 6 months    Recent Outpatient Visits          3 weeks ago Type 2 diabetes mellitus without complication, without long-term current use of insulin ([place])   [facility] Birdie Sons, MD   7 months ago Type 2 diabetes mellitus without complication, without long-term current use of insulin ([place])   [facility] Birdie Sons, MD   1 year ago Type 2 diabetes mellitus without complication, without long-term current use of insulin [facility])   [facility] Birdie Sons, MD   3 years ago Exposure to measles virus   [place], Kirstie Peri, MD   3 years ago Chest pain, unspecified type   [facility] Birdie Sons, MD      Future Appointments            In 3 months Fisher, Kirstie Peri, MD [facility], [place]

## 2020-11-09 ENCOUNTER — Other Ambulatory Visit: Payer: Self-pay | Admitting: Family Medicine

## 2020-11-09 DIAGNOSIS — M5137 Other intervertebral disc degeneration, lumbosacral region: Secondary | ICD-10-CM

## 2020-11-09 NOTE — Telephone Encounter (Signed)
Requested medication (s) are due for refill today - ye  Requested medication (s) are on the active medication list -yes  Future visit scheduled -yes  Last refill: 08/09/20 #120  Notes to clinic: Request RF: non delegated Rx  Requested Prescriptions  Pending Prescriptions Disp Refills   HYDROcodone-acetaminophen (NORCO/VICODIN) 5-325 MG tablet 120 tablet 0    Sig: Take 1-2 tablets by mouth every 6 (six) hours as needed for moderate pain.     Not Delegated - Analgesics:  Opioid Agonist Combinations Failed - 11/09/2020  1:11 PM      Failed - This refill cannot be delegated      Failed - Urine Drug Screen completed in last 360 days      Passed - Valid encounter within last 6 months    Recent Outpatient Visits           2 months ago Type 2 diabetes mellitus without complication, without long-term current use of insulin ([place])   [facility] Birdie Sons, MD   9 months ago Type 2 diabetes mellitus without complication, without long-term current use of insulin ([place])   [facility] Birdie Sons, MD   1 year ago Type 2 diabetes mellitus without complication, without long-term current use of insulin ([place])   [facility] Birdie Sons, MD   3 years ago Exposure to measles virus   [place], Kirstie Peri, MD   3 years ago Chest pain, unspecified type   [facility] Birdie Sons, MD       Future Appointments             In 1 month Fisher, Kirstie Peri, MD [facility]               Requested Prescriptions  Pending Prescriptions Disp Refills   HYDROcodone-acetaminophen (NORCO/VICODIN) 5-325 MG tablet 120 tablet 0    Sig: Take 1-2 tablets by mouth every 6 (six) hours as needed for moderate pain.     Not Delegated - Analgesics:  Opioid Agonist Combinations Failed - 11/09/2020  1:11 PM      Failed - This refill cannot be delegated      Failed - Urine Drug Screen completed  in last 360 days      Passed - Valid encounter within last 6 months    Recent Outpatient Visits           2 months ago Type 2 diabetes mellitus without complication, without long-term current use of insulin ([place])   [facility] Birdie Sons, MD   9 months ago Type 2 diabetes mellitus without complication, without long-term current use of insulin ([place])   [facility] Birdie Sons, MD   1 year ago Type 2 diabetes mellitus without complication, without long-term current use of insulin [facility])   [facility] Birdie Sons, MD   3 years ago Exposure to measles virus   [place], Kirstie Peri, MD   3 years ago Chest pain, unspecified type   [facility] Birdie Sons, MD       Future Appointments             In 1 month Fisher, Kirstie Peri, MD [facility] [place]

## 2020-11-09 NOTE — Telephone Encounter (Signed)
Copied from [place] [id number]. Topic: Quick Communication - Rx Refill/Question >> Nov 09, 2020  1:07 PM Tessa Lerner A wrote: Medication: HYDROcodone-acetaminophen (NORCO/VICODIN) 5-325 MG tablet [id number]   Has the patient contacted their pharmacy? Yes.   (Agent: If no, request that the patient contact the pharmacy for the refill.) (Agent: If yes, when and what did the pharmacy advise?)  Preferred Pharmacy (with phone number or street name): [facility] - [place], [place]  Phone:  [phone] Fax:  [phone]  Agent: Please be advised that RX refills may take up to 3 business days. We ask that you follow-up with your pharmacy.

## 2020-11-12 MED ORDER — HYDROCODONE-ACETAMINOPHEN 5-325 MG PO TABS: 1.0000 | ORAL_TABLET | Freq: Four times a day (QID) | ORAL | 0 refills | Status: DC | PRN

## 2020-11-18 ENCOUNTER — Other Ambulatory Visit: Payer: Self-pay | Admitting: Family Medicine

## 2020-12-13 ENCOUNTER — Other Ambulatory Visit: Payer: Self-pay | Admitting: Family Medicine

## 2020-12-13 DIAGNOSIS — M5137 Other intervertebral disc degeneration, lumbosacral region: Secondary | ICD-10-CM

## 2020-12-13 DIAGNOSIS — N951 Menopausal and female climacteric states: Secondary | ICD-10-CM

## 2020-12-13 NOTE — Telephone Encounter (Signed)
Medication Refill - Medication: HYDROcodone-acetaminophen (NORCO/VICODIN) 5-325 MG tablet  Has the patient contacted their pharmacy? yes (Agent: If no, request that the patient contact the pharmacy for the refill.) (Agent: If yes, when and what did the pharmacy advise?)contact pcp  Preferred Pharmacy (with phone number or street name):  [place], [address]  [place] [address]  Phone: [phone] Fax: [phone]   Has the patient been seen for an appointment in the last year OR does the patient have an upcoming appointment? yes  Agent: Please be advised that RX refills may take up to 3 business days. We ask that you follow-up with your pharmacy.

## 2020-12-13 NOTE — Telephone Encounter (Incomplete)
Medication Refill - Medication: HYDROcodone-acetaminophen (NORCO/VICODIN) 5-325 MG tablet Has the patient contacted their pharmacy? yes (Agent: If no, request that the patient contact the pharmacy for the refill.) (Agent: If yes, when and what did the pharmacy advise?)contact pcp  Preferred Pharmacy (with phone number or street name): *** Has the patient been seen for an appointment in the last year OR does the patient have an upcoming appointment? yes  Agent: Please be advised that RX refills may take up to 3 business days. We ask that you follow-up with your pharmacy.

## 2020-12-13 NOTE — Telephone Encounter (Signed)
Requested medication (s) are due for refill today:yes  Requested medication (s) are on the active medication list:yes  Last refill: 11/12/20  #120  0 refills  Future visit scheduled:  Yes 12/22/20  Notes to clinic: not delegated  Requested Prescriptions  Pending Prescriptions Disp Refills   HYDROcodone-acetaminophen (NORCO/VICODIN) 5-325 MG tablet 120 tablet 0    Sig: Take 1-2 tablets by mouth every 6 (six) hours as needed for moderate pain.     Not Delegated - Analgesics:  Opioid Agonist Combinations Failed - 12/13/2020  5:04 PM      Failed - This refill cannot be delegated      Failed - Urine Drug Screen completed in last 360 days      Passed - Valid encounter within last 6 months    Recent Outpatient Visits           4 months ago Type 2 diabetes mellitus without complication, without long-term current use of insulin ([place])   [facility] Birdie Sons, MD   10 months ago Type 2 diabetes mellitus without complication, without long-term current use of insulin [facility])   [facility] Birdie Sons, MD   1 year ago Type 2 diabetes mellitus without complication, without long-term current use of insulin [facility])   [facility] Birdie Sons, MD   3 years ago Exposure to measles virus   [place], Kirstie Peri, MD   3 years ago Chest pain, unspecified type   [facility] Birdie Sons, MD       Future Appointments             In 1 week Fisher, Kirstie Peri, MD [facility], [place]

## 2020-12-13 NOTE — Telephone Encounter (Signed)
Requested medications are due for refill today.  yes  Requested medications are on the active medications list.  yes  Last refill. 07/18/2020  Future visit scheduled.   yes  Notes to clinic.  Failed protocol.

## 2020-12-14 ENCOUNTER — Other Ambulatory Visit: Payer: Self-pay | Admitting: Family Medicine

## 2020-12-14 NOTE — Telephone Encounter (Signed)
Requested medication (s) are due for refill today Yes  Last ordered 08/26/20 #30 with one refill.  Requested medication (s) are on the active medication list Yes  Future visit scheduled Yes on 12/22/20  Note to clinic-labs do not meet protocol requirements. Routing to office for review   Requested Prescriptions  Pending Prescriptions Disp Refills   meloxicam (MOBIC) 15 MG tablet [Pharmacy Med Name: MELOXICAM 15 MG Tablet] 60 tablet     Sig: TAKE 1 TABLET EVERY DAY     Analgesics:  COX2 Inhibitors Failed - 12/14/2020  8:05 AM      Failed - HGB in normal range and within 360 days    Hemoglobin  Date Value Ref Range Status  07/12/2019 15.8 11.1 - 15.9 g/dL Final          Failed - Cr in normal range and within 360 days    Creatinine  Date Value Ref Range Status  09/02/2011 1.01 0.60 - 1.30 mg/dL Final   Creatinine, Ser  Date Value Ref Range Status  07/12/2019 0.77 0.57 - 1.00 mg/dL Final          Passed - Patient is not pregnant      Passed - Valid encounter within last 12 months    Recent Outpatient Visits           4 months ago Type 2 diabetes mellitus without complication, without long-term current use of insulin ([place])   [facility] Birdie Sons, MD   10 months ago Type 2 diabetes mellitus without complication, without long-term current use of insulin ([place])   [facility] Birdie Sons, MD   1 year ago Type 2 diabetes mellitus without complication, without long-term current use of insulin [facility])   [facility] Birdie Sons, MD   3 years ago Exposure to measles virus   [place], Kirstie Peri, MD   3 years ago Chest pain, unspecified type   [facility] Birdie Sons, MD       Future Appointments             In 1 week Fisher, Kirstie Peri, MD [facility], [place]

## 2020-12-14 NOTE — Telephone Encounter (Signed)
LOV: 08/15/2020 NOV: 12/22/2020   Last Refill: 08/26/2020 #30 1 Refill

## 2020-12-14 NOTE — Telephone Encounter (Signed)
LOV:  08/15/2020 NOV: 12/22/2020  Last Refill: 07/18/2020

## 2020-12-15 MED ORDER — HYDROCODONE-ACETAMINOPHEN 5-325 MG PO TABS: 1.0000 | ORAL_TABLET | Freq: Four times a day (QID) | ORAL | 0 refills | Status: DC | PRN

## 2020-12-15 MED ORDER — MEDROXYPROGESTERONE ACETATE 2.5 MG PO TABS: 2.5000 mg | ORAL_TABLET | Freq: Every day | ORAL | 3 refills | Status: DC

## 2020-12-22 ENCOUNTER — Ambulatory Visit: Payer: Self-pay | Admitting: Family Medicine

## 2020-12-26 ENCOUNTER — Telehealth: Payer: Self-pay | Admitting: Family Medicine

## 2020-12-26 NOTE — Telephone Encounter (Signed)
[facility] faxed refill request for the following medications:  meloxicam (MOBIC) 15 MG tablet    Please advise.

## 2021-04-04 ENCOUNTER — Other Ambulatory Visit: Payer: Self-pay | Admitting: Family Medicine

## 2021-04-04 DIAGNOSIS — F32A Depression, unspecified: Secondary | ICD-10-CM

## 2021-04-04 DIAGNOSIS — E119 Type 2 diabetes mellitus without complications: Secondary | ICD-10-CM

## 2021-04-04 DIAGNOSIS — F419 Anxiety disorder, unspecified: Secondary | ICD-10-CM

## 2021-04-04 NOTE — Telephone Encounter (Signed)
Attempted to call patient- left message to call office for appointment. Courtesy 30 day Rx given. Requested Prescriptions  Pending Prescriptions Disp Refills   escitalopram (LEXAPRO) 20 MG tablet [Pharmacy Med Name: ESCITALOPRAM OXALATE 20 MG Tablet] 90 tablet 0    Sig: TAKE 1 TABLET EVERY DAY     Psychiatry:  Antidepressants - SSRI Failed - 04/04/2021  8:25 AM      Failed - Valid encounter within last 6 months    Recent Outpatient Visits          7 months ago Type 2 diabetes mellitus without complication, without long-term current use of insulin ([place])   [facility] Birdie Sons, MD   1 year ago Type 2 diabetes mellitus without complication, without long-term current use of insulin ([place])   [facility] Birdie Sons, MD   1 year ago Type 2 diabetes mellitus without complication, without long-term current use of insulin ([place])   [facility] Birdie Sons, MD   3 years ago Exposure to measles virus   [facility], Kirstie Peri, MD   3 years ago Chest pain, unspecified type   [facility] Birdie Sons, MD             Passed - Completed PHQ-2 or PHQ-9 in the last 360 days       metFORMIN (GLUCOPHAGE-XR) 500 MG 24 hr tablet [Pharmacy Med Name: METFORMIN HYDROCHLORIDE ER 500 MG Tablet Extended Release 24 Hour] 180 tablet 0    Sig: TAKE 2 [place]     Endocrinology:  Diabetes - Biguanides Failed - 04/04/2021  8:25 AM      Failed - Cr in normal range and within 360 days    Creatinine  Date Value Ref Range Status  09/02/2011 1.01 0.60 - 1.30 mg/dL Final   Creatinine, Ser  Date Value Ref Range Status  07/12/2019 0.77 0.57 - 1.00 mg/dL Final         Failed - HBA1C is between 0 and 7.9 and within 180 days    Hemoglobin A1C  Date Value Ref Range Status  08/15/2020 5.7 (A) 4.0 - 5.6 % Final  09/02/2011 5.8 4.2 - 6.3 % Final    Comment:    The American Diabetes  Association recommends that a primary goal of therapy should be <7% and that physicians should reevaluate the treatment regimen in patients with HbA1c values consistently >8%.    Hgb A1c MFr Bld  Date Value Ref Range Status  07/12/2019 5.7 (H) 4.8 - 5.6 % Final    Comment:             Prediabetes: 5.7 - 6.4          Diabetes: >6.4          Glycemic control for adults with diabetes: <7.0          Failed - eGFR in normal range and within 360 days    EGFR (African American)  Date Value Ref Range Status  09/02/2011 >60  Final   GFR calc Af Amer  Date Value Ref Range Status  07/12/2019 95 >59 mL/min/1.73 Final    Comment:    **[facility] currently reports eGFR in compliance with the current**   recommendations of the [employer]. [facility] will   update reporting as new guidelines are published from the NKF-ASN   Task force.    EGFR (Non-African Amer.)  Date Value Ref Range Status  09/02/2011 >60  Final    Comment:    eGFR values <20m/min/1.73 m2 may be an indication of chronic kidney disease (CKD). Calculated eGFR is useful in patients with stable renal function. The eGFR calculation will not be reliable in acutely ill patients when serum creatinine is changing rapidly. It is not useful in  patients on dialysis. The eGFR calculation may not be applicable to patients at the low and high extremes of body sizes, pregnant women, and vegetarians.    GFR calc non Af Amer  Date Value Ref Range Status  07/12/2019 82 >59 mL/min/1.73 Final         Failed - Valid encounter within last 6 months    Recent Outpatient Visits          7 months ago Type 2 diabetes mellitus without complication, without long-term current use of insulin [place]   [facility]   1 year ago Type 2 diabetes mellitus without complication, without long-term current use of insulin ([facility]   1 year ago Type 2  diabetes mellitus without complication, without long-term current use of insulin ([facility]   [facility]   3 years ago Exposure to measles virus   [place]   3 years ago Chest pain, unspecified type   [facility]

## 2021-04-16 ENCOUNTER — Other Ambulatory Visit: Payer: Self-pay | Admitting: Family Medicine

## 2021-04-16 NOTE — Telephone Encounter (Signed)
Courtesy refill. Overdue encounter. Called patient to schedule future appt no answer unable to leave message VM full.  Requested Prescriptions  Pending Prescriptions Disp Refills   losartan-hydrochlorothiazide (HYZAAR) 100-25 MG tablet [Pharmacy Med Name: LOSARTAN POTASSIUM/HYDROCHLOROTHIAZIDE 100-25 MG Tablet] 30 tablet 0    Sig: TAKE 1 TABLET EVERY DAY     Cardiovascular: ARB + Diuretic Combos Failed - 04/16/2021  5:05 AM      Failed - K in normal range and within 180 days    Potassium  Date Value Ref Range Status  07/12/2019 4.1 3.5 - 5.2 mmol/L Final  09/02/2011 4.0 3.5 - 5.1 mmol/L Final         Failed - Na in normal range and within 180 days    Sodium  Date Value Ref Range Status  07/12/2019 142 134 - 144 mmol/L Final  09/02/2011 141 136 - 145 mmol/L Final         Failed - Cr in normal range and within 180 days    Creatinine  Date Value Ref Range Status  09/02/2011 1.01 0.60 - 1.30 mg/dL Final   Creatinine, Ser  Date Value Ref Range Status  07/12/2019 0.77 0.57 - 1.00 mg/dL Final         Failed - Ca in normal range and within 180 days    Calcium  Date Value Ref Range Status  07/12/2019 9.2 8.7 - 10.3 mg/dL Final   Calcium, Total  Date Value Ref Range Status  09/02/2011 8.7 8.5 - 10.1 mg/dL Final         Failed - Valid encounter within last 6 months    Recent Outpatient Visits          8 months ago Type 2 diabetes mellitus without complication, without long-term current use of insulin ([place])   [facility] Birdie Sons, MD   1 year ago Type 2 diabetes mellitus without complication, without long-term current use of insulin ([place])   [facility] Birdie Sons, MD   1 year ago Type 2 diabetes mellitus without complication, without long-term current use of insulin [facility])   [facility] Birdie Sons, MD   3 years ago Exposure to measles virus   [facility] Birdie Sons, MD   3 years ago Chest  pain, unspecified type   [facility] Birdie Sons, MD             Passed - Patient is not pregnant      Passed - Last BP in normal range    BP Readings from Last 1 Encounters:  08/15/20 136/83

## 2021-05-01 ENCOUNTER — Other Ambulatory Visit: Payer: Self-pay | Admitting: Family Medicine

## 2021-05-01 DIAGNOSIS — M5137 Other intervertebral disc degeneration, lumbosacral region: Secondary | ICD-10-CM

## 2021-05-01 DIAGNOSIS — L918 Other hypertrophic disorders of the skin: Secondary | ICD-10-CM

## 2021-05-01 NOTE — Telephone Encounter (Signed)
Medication Refill - Medication: HYDROcodone-acetaminophen (NORCO/VICODIN) 5-325 MG tablet  ranitidine (ZANTAC) 150 MG capsule  Has the patient contacted their pharmacy? Yes.   (Agent: If no, request that the patient contact the pharmacy for the refill. If patient does not wish to contact the pharmacy document the reason why and proceed with request.) (Agent: If yes, when and what did the pharmacy advise?)  Preferred Pharmacy (with phone number or street name):  [facility], [address]  [place] [address]  Phone: [phone] Fax: [phone]   Has the patient been seen for an appointment in the last year OR does the patient have an upcoming appointment? Yes.    Agent: Please be advised that RX refills may take up to 3 business days. We ask that you follow-up with your pharmacy.

## 2021-05-02 ENCOUNTER — Telehealth: Payer: Self-pay

## 2021-05-02 NOTE — Telephone Encounter (Signed)
Copied from [place] [phone]. Topic: Appointment Scheduling - Scheduling Inquiry for Clinic >> May 01, 2021  4:41 PM Erick Blinks wrote: Reason for CRM: Pt wants to have her shingles vaccine during upcoming appt

## 2021-05-02 NOTE — Telephone Encounter (Signed)
Requested medications are due for refill today.  yes  Requested medications are on the active medications list.  Yes - both  Last refill. Zantac 08/16/2020 #180 4 refills,  Norco/Vicodin 12/15/2020 #120 0 refills  Future visit scheduled.   yes  Notes to clinic.  Zantac not assigned a protocol. Norco not delegated.    Requested Prescriptions  Pending Prescriptions Disp Refills   ranitidine (ZANTAC) 150 MG capsule 180 capsule 4    Sig: Take 1 capsule (150 mg total) by mouth 2 (two) times daily.     Off-Protocol Failed - 05/01/2021  4:48 PM      Failed - Medication not assigned to a protocol, review manually.      Passed - Valid encounter within last 12 months    Recent Outpatient Visits           8 months ago Type 2 diabetes mellitus without complication, without long-term current use of insulin ([place])   [facility] Birdie Sons, MD   1 year ago Type 2 diabetes mellitus without complication, without long-term current use of insulin ([place])   [facility] Birdie Sons, MD   1 year ago Type 2 diabetes mellitus without complication, without long-term current use of insulin [facility])   [facility] Birdie Sons, MD   3 years ago Exposure to measles virus   [place], Kirstie Peri, MD   3 years ago Chest pain, unspecified type   [facility] Birdie Sons, MD       Future Appointments             In 1 week Fisher, Kirstie Peri, MD [facility]             HYDROcodone-acetaminophen (NORCO/VICODIN) 5-325 MG tablet 120 tablet 0    Sig: Take 1-2 tablets by mouth every 6 (six) hours as needed for moderate pain.     Not Delegated - Analgesics:  Opioid Agonist Combinations Failed - 05/01/2021  4:48 PM      Failed - This refill cannot be delegated      Failed - Urine Drug Screen completed in last 360 days      Failed - Valid encounter within last 3 months    Recent Outpatient  Visits           8 months ago Type 2 diabetes mellitus without complication, without long-term current use of insulin ([place])   [facility] Birdie Sons, MD   1 year ago Type 2 diabetes mellitus without complication, without long-term current use of insulin [facility])   [facility] Birdie Sons, MD   1 year ago Type 2 diabetes mellitus without complication, without long-term current use of insulin [facility])   [facility] Birdie Sons, MD   3 years ago Exposure to measles virus   [place], Kirstie Peri, MD   3 years ago Chest pain, unspecified type   [facility] Birdie Sons, MD       Future Appointments             In 1 week Fisher, Kirstie Peri, MD [facility], [place]

## 2021-05-02 NOTE — Telephone Encounter (Signed)
Copied from [place] [phone]. Topic: Referral - Request for Referral >> May 01, 2021  4:32 PM Erick Blinks wrote: Has patient seen PCP for this complaint? Yes.   *If NO, is insurance requiring patient see PCP for this issue before PCP can refer them? Referral for which specialty: Dermatology  Preferred provider/office: [facility] recommended  Reason for referral: Has growths on her face    Also is requesting a Hearing Test   Also is requesting a referral for Podiatry

## 2021-05-02 NOTE — Telephone Encounter (Signed)
Please advise on referral request. Patient has an upcoming appointment on 05/14/2021. Would you prefer to discuss referral requests at that appointment.

## 2021-05-03 MED ORDER — HYDROCODONE-ACETAMINOPHEN 5-325 MG PO TABS: 1.0000 | ORAL_TABLET | Freq: Four times a day (QID) | ORAL | 0 refills | Status: DC | PRN

## 2021-05-03 MED ORDER — RANITIDINE HCL 150 MG PO CAPS: 150.0000 mg | ORAL_CAPSULE | Freq: Two times a day (BID) | ORAL | 4 refills | Status: AC

## 2021-05-06 ENCOUNTER — Other Ambulatory Visit: Payer: Self-pay | Admitting: Family Medicine

## 2021-05-07 NOTE — Telephone Encounter (Signed)
Requested medication (s) are due for refill today: yes  Requested medication (s) are on the active medication list: yes  Last refill:  12/15/20 #90 3 refills   Future visit scheduled: yes in 1 week  Notes to clinic:  do you want to refill Rx ? Last BP 136/86 on 08/15/20 and patient is smoker .      Requested Prescriptions  Pending Prescriptions Disp Refills   medroxyPROGESTERone (PROVERA) 2.5 MG tablet [Pharmacy Med Name: MEDROXYPROGESTERONE ACETATE 2.5 MG Tablet] 90 tablet 3    Sig: TAKE 1 TABLET EVERY DAY     OB/GYN:  Progestins Failed - 05/06/2021  6:10 AM      Failed - Patient is not a smoker      Passed - Last BP in normal range    BP Readings from Last 1 Encounters:  08/15/20 136/83          Passed - Valid encounter within last 12 months    Recent Outpatient Visits           8 months ago Type 2 diabetes mellitus without complication, without long-term current use of insulin ([place])   [facility] Birdie Sons, MD   1 year ago Type 2 diabetes mellitus without complication, without long-term current use of insulin ([place])   [facility] Birdie Sons, MD   1 year ago Type 2 diabetes mellitus without complication, without long-term current use of insulin [facility])   [facility] Birdie [facility], MD   3 years ago Exposure to measles virus   [place], Kirstie Peri, MD   3 years ago Chest pain, unspecified type   [facility] Birdie Sons, MD       Future Appointments             In 1 week Fisher, Kirstie Peri, MD [facility], [place]

## 2021-05-08 ENCOUNTER — Telehealth: Payer: Self-pay

## 2021-05-08 NOTE — Telephone Encounter (Signed)
Patient reports that per the pharmacy they are needing extra information. And to reach the pharmacy to see what information they are needing.

## 2021-05-08 NOTE — Telephone Encounter (Signed)
Copied from [place] [phone]. Topic: General - Other >> May 08, 2021 10:12 AM Valere Dross wrote: Reason for CRM: Pt called in stating she needed to speak with someone about her HYDROcodone-acetaminophen (NORCO/VICODIN) 5-325 MG tablet medication. Please advise.

## 2021-05-08 NOTE — Telephone Encounter (Signed)
[facility] and spoke with [facility] pharmacist tech. She transferred me to [facility] one of the pharmacist. Per Zahid the patient prescription is under opoid naive-patient who has not used opioids for more than seven consecutive days during the previous 30 days. Last filled was 12/15/20 and they just want the provider to be aware that it may go like a new prescription and she probably would only get 7 day supply. But would like to know if in case it goes through that the provider is ok with filling qty:120 Please advise.  Pharmacist: Zahid Direct line: [phone]

## 2021-05-09 DIAGNOSIS — E119 Type 2 diabetes mellitus without complications: Secondary | ICD-10-CM | POA: Diagnosis not present

## 2021-05-09 DIAGNOSIS — Z0100 Encounter for examination of eyes and vision without abnormal findings: Secondary | ICD-10-CM | POA: Diagnosis not present

## 2021-05-09 DIAGNOSIS — H5212 Myopia, left eye: Secondary | ICD-10-CM | POA: Diagnosis not present

## 2021-05-14 ENCOUNTER — Ambulatory Visit: Payer: Self-pay | Admitting: *Deleted

## 2021-05-14 ENCOUNTER — Encounter: Payer: Self-pay | Admitting: Family Medicine

## 2021-05-14 ENCOUNTER — Telehealth: Payer: Self-pay

## 2021-05-14 ENCOUNTER — Other Ambulatory Visit (HOSPITAL_COMMUNITY)
Admission: RE | Admit: 2021-05-14 | Discharge: 2021-05-14 | Disposition: A | Discharge status: Home / Self Care | Payer: Medicare HMO | Source: Ambulatory Visit | Attending: Family Medicine | Admitting: Family Medicine

## 2021-05-14 ENCOUNTER — Other Ambulatory Visit: Payer: Self-pay

## 2021-05-14 ENCOUNTER — Ambulatory Visit (INDEPENDENT_AMBULATORY_CARE_PROVIDER_SITE_OTHER): Payer: Medicare HMO | Admitting: Family Medicine

## 2021-05-14 VITALS — BP 163/91 | HR 60 | Temp 98.5°F | Resp 16 | Wt 239.0 lb

## 2021-05-14 DIAGNOSIS — Z7251 High risk heterosexual behavior: Secondary | ICD-10-CM | POA: Insufficient documentation

## 2021-05-14 DIAGNOSIS — K760 Fatty (change of) liver, not elsewhere classified: Secondary | ICD-10-CM

## 2021-05-14 DIAGNOSIS — I10 Essential (primary) hypertension: Secondary | ICD-10-CM

## 2021-05-14 DIAGNOSIS — E1151 Type 2 diabetes mellitus with diabetic peripheral angiopathy without gangrene: Secondary | ICD-10-CM | POA: Diagnosis not present

## 2021-05-14 DIAGNOSIS — E119 Type 2 diabetes mellitus without complications: Secondary | ICD-10-CM

## 2021-05-14 DIAGNOSIS — R350 Frequency of micturition: Secondary | ICD-10-CM

## 2021-05-14 DIAGNOSIS — H9193 Unspecified hearing loss, bilateral: Secondary | ICD-10-CM

## 2021-05-14 DIAGNOSIS — B3731 Acute candidiasis of vulva and vagina: Secondary | ICD-10-CM | POA: Insufficient documentation

## 2021-05-14 DIAGNOSIS — M67971 Unspecified disorder of synovium and tendon, right ankle and foot: Secondary | ICD-10-CM

## 2021-05-14 DIAGNOSIS — L858 Other specified epidermal thickening: Secondary | ICD-10-CM

## 2021-05-14 DIAGNOSIS — N390 Urinary tract infection, site not specified: Secondary | ICD-10-CM

## 2021-05-14 DIAGNOSIS — E785 Hyperlipidemia, unspecified: Secondary | ICD-10-CM

## 2021-05-14 DIAGNOSIS — Z124 Encounter for screening for malignant neoplasm of cervix: Secondary | ICD-10-CM

## 2021-05-14 DIAGNOSIS — I700 Atherosclerosis of aorta: Secondary | ICD-10-CM | POA: Diagnosis not present

## 2021-05-14 LAB — POCT URINALYSIS DIPSTICK
Glucose, UA: NEGATIVE
Ketones, UA: NEGATIVE
Nitrite, UA: POSITIVE
Protein, UA: POSITIVE — AB
Spec Grav, UA: 1.015 (ref 1.010–1.025)
Urobilinogen, UA: 0.2 E.U./dL
pH, UA: 6.5 (ref 5.0–8.0)

## 2021-05-14 MED ORDER — VALACYCLOVIR HCL 1 G PO TABS: 1000.0000 mg | ORAL_TABLET | Freq: Two times a day (BID) | ORAL | 0 refills | Status: DC

## 2021-05-14 MED ORDER — CEFDINIR 300 MG PO CAPS: 600.0000 mg | ORAL_CAPSULE | Freq: Every day | ORAL | 0 refills | Status: AC

## 2021-05-14 MED ORDER — CEFDINIR 300 MG PO CAPS: 600.0000 mg | ORAL_CAPSULE | Freq: Every day | ORAL | 0 refills | Status: DC

## 2021-05-14 NOTE — Telephone Encounter (Signed)
Patient is awaiting alternative Rx- they do not have prescribed antibiotic in stock. Please call patient when alternative is sent so she can start as soon as possible.

## 2021-05-14 NOTE — Telephone Encounter (Signed)
Prescription is in stock at [facility] in [place]. I resent prescription to [facility] and advised patient.

## 2021-05-14 NOTE — Addendum Note (Signed)
Addended by: Randal Buba on: 05/14/2021 05:20 PM   Modules accepted: Orders

## 2021-05-14 NOTE — Telephone Encounter (Signed)
Marcie Bal with [facility] [phone] in [place] at [phone] called.  The system reflects pt having an allergy to penicillin and PCP has ordered cefdinir (Omnicef) 300 mg caplets therefore there may be a drug interaction.  Please call pharmacy. Reason for Disposition  [1] Caller has URGENT medicine question about med that PCP or specialist prescribed AND [2] triager unable to answer question  Answer Assessment - Initial Assessment Questions 1. NAME of MEDICATION: "What medicine are you calling about?"     Cefdinir (Omnicef) 300 mg 2. QUESTION: "What is your question?" (e.g., double dose of medicine, side effect)     Pharmacy is calling in regard to a possible medication allergy to penicillin and cefdinir has been ordered. See documentation notes. 3. PRESCRIBING HCP: "Who prescribed it?" Reason: if prescribed by specialist, call should be referred to that group.     *No Answer* 4. SYMPTOMS: "Do you have any symptoms?"     *No Answer* 5. SEVERITY: If symptoms are present, ask "Are they mild, moderate or severe?"     *No Answer* 6. PREGNANCY:  "Is there any chance that you are pregnant?" "When was your last menstrual period?"     *No Answer*  Protocols used: Medication Question Call-A-AH

## 2021-05-14 NOTE — Telephone Encounter (Signed)
I discussed this with the patient. She has taken this in the past without any problems.

## 2021-05-14 NOTE — Telephone Encounter (Signed)
Reason for Disposition  [1] Caller has URGENT medicine question about med that PCP or specialist prescribed AND [2] triager unable to answer question  Answer Assessment - Initial Assessment Questions 1. NAME of MEDICATION: "What medicine are you calling about?"     Patient states the pharmacy did not have the [facility]- so they are requesting an alternative. Patient is waiting for it to be addressed. 2. QUESTION: "What is your question?" (e.g., double dose of medicine, side effect)     Change in Rx- antibiotic 3. PRESCRIBING HCP: "Who prescribed it?" Reason: if prescribed by specialist, call should be referred to that group.     PCP 4. SYMPTOMS: "Do you have any symptoms?"     Urinary symptoms Patient would like to be notified when Rx has been sent to pharmacy so she can pick it up. [facility]  Protocols used: Medication Question Call-A-AH

## 2021-05-14 NOTE — Progress Notes (Signed)
I,Roshena L Chambers,acting as a scribe for Lelon Huh, MD.,have documented all relevant documentation on the behalf of Lelon Huh, MD,as directed by  Lelon Huh, MD while in the presence of Lelon Huh, MD.    Established patient visit   Patient: Misty Bishop   DOB: [birth year]   66 y.o. Female  MRN: [id number] Visit Date: 05/14/2021  Today's healthcare provider: Lelon Huh, MD   Chief Complaint  Patient presents with   Diabetes   Hypertension   Subjective    HPI  Diabetes Mellitus Type II, Follow-up  Lab Results  Component Value Date   HGBA1C 5.7 (A) 08/15/2020   HGBA1C 5.8 (A) 01/25/2020   HGBA1C 5.7 (H) 07/12/2019   Wt Readings from Last 3 Encounters:  05/14/21 239 lb (108.4 kg)  08/15/20 236 lb (107 kg)  01/25/20 242 lb (109.8 kg)   Last seen for diabetes 9 months ago.  Management since then includes continuing same medication. She reports good compliance with treatment. She is not having side effects.  Symptoms: No fatigue No foot ulcerations  No appetite changes No nausea  No paresthesia of the feet  No polydipsia  No polyuria No visual disturbances   No vomiting     Home blood sugar records:  blood sugars are not checked  Episodes of hypoglycemia? No    Current insulin regiment: none Most Recent Eye Exam: >1 year ago Current exercise: none Current diet habits: in general, an "unhealthy" diet  Pertinent Labs: Lab Results  Component Value Date   CHOL 193 07/12/2019   HDL 42 07/12/2019   LDLCALC 123 (H) 07/12/2019   TRIG 156 (H) 07/12/2019   CHOLHDL 4.6 (H) 07/12/2019   Lab Results  Component Value Date   NA 142 07/12/2019   K 4.1 07/12/2019   CREATININE 0.77 07/12/2019   GFRNONAA 82 07/12/2019     ---------------------------------------------------------------------------------------------------  Hypertension, follow-up  BP Readings from Last 3 Encounters:  05/14/21 (!) 163/91  08/15/20 136/83  01/25/20 (!)  154/86   Wt Readings from Last 3 Encounters:  05/14/21 239 lb (108.4 kg)  08/15/20 236 lb (107 kg)  01/25/20 242 lb (109.8 kg)     She was last seen for hypertension 1  year  ago.  BP at that visit was 154/86. Management since that visit includes advising patient to exercise to work on weight loss and avoid sodium in diet.  She reports good compliance with treatment. She is not having side effects.  She is following a Regular diet. She is not exercising. She does smoke.  Outside blood pressures are note being checked. . Symptoms: No chest pain No chest pressure  No palpitations No syncope  No dyspnea No orthopnea  No paroxysmal nocturnal dyspnea No lower extremity edema   Pertinent labs: Lab Results  Component Value Date   CHOL 193 07/12/2019   HDL 42 07/12/2019   LDLCALC 123 (H) 07/12/2019   TRIG 156 (H) 07/12/2019   CHOLHDL 4.6 (H) 07/12/2019   Lab Results  Component Value Date   NA 142 07/12/2019   K 4.1 07/12/2019   CREATININE 0.77 07/12/2019   GFRNONAA 82 07/12/2019   GLUCOSE 149 (H) 07/12/2019   TSH 2.280 07/12/2019     The 10-year ASCVD risk score (Arnett DK, et al., 2019) is: 39.5%   ---------------------------------------------------------------------------------------------------   She has a number of other issues she wants addressed. She has lesion on her left upper cheek that has been there a year or  two that appears to be a cutaneous horn.   She has some type of vaginal lesion which she thinks may be  a herpes lesion. She was diagnosed with HSV  a few years ago and was treated with Valtrex, which she is now out of.   She has been sexually active with a single partner for several months without protection and requests complete STD screening. She is also overdue for gyn screening and requests referral to gyn.   She also reports pain and swelling around her right ankle for the last few years. She states she was diagnosed with achilles tendonitis a few  years ago by Hollie Salk (DPM), but pain never resolved. She wants referral to orthopedist for evaluation and treatment.    She also reports having more trouble hearing and would like to see an audiologist.   Medications: Outpatient Medications Prior to Visit  Medication Sig   Accu-Chek FastClix Lancets MISC CHECK BLOOD SUGAR EVERY DAY   ACCU-CHEK SMARTVIEW test strip CHECK BLOOD SUGAR EVERY DAY   albuterol (VENTOLIN HFA) 108 (90 Base) MCG/ACT inhaler Inhale 2 puffs into the lungs every 6 (six) hours as needed.   aspirin 81 MG EC tablet Take 81 mg by mouth daily.   atorvastatin (LIPITOR) 40 MG tablet TAKE 1 TABLET EVERY DAY   b complex vitamins capsule Take 1 capsule by mouth Once PRN.   clonazePAM (KLONOPIN) 1 MG tablet Take 1-2 tablets daily and 2 tablets at bedtime.   dexlansoprazole (DEXILANT) 60 MG capsule Take 1 capsule (60 mg total) daily by mouth.   diclofenac (VOLTAREN) 75 MG EC tablet Take 1 tablet (75 mg total) by mouth 2 (two) times daily as needed.   diclofenac Sodium (VOLTAREN) 1 % GEL Apply 1 application topically 4 (four) times daily as needed.   escitalopram (LEXAPRO) 20 MG tablet TAKE 1 TABLET EVERY DAY   estradiol (ESTRACE) 0.5 MG tablet TAKE 1 TABLET EVERY DAY   fluticasone (FLONASE) 50 MCG/ACT nasal spray Place 2 sprays into both nostrils daily.   HYDROcodone-acetaminophen (NORCO/VICODIN) 5-325 MG tablet Take 1-2 tablets by mouth every 6 (six) hours as needed for moderate pain.   loratadine (CLARITIN) 10 MG tablet Take 1 tablet by mouth daily.   losartan-hydrochlorothiazide (HYZAAR) 100-25 MG tablet TAKE 1 TABLET EVERY DAY   medroxyPROGESTERone (PROVERA) 2.5 MG tablet TAKE 1 TABLET EVERY DAY   meloxicam (MOBIC) 15 MG tablet Take 1 tablet (15 mg total) by mouth daily as needed.   metFORMIN (GLUCOPHAGE-XR) 500 MG 24 hr tablet TAKE 2 TABLETS EVERY DAY WITH BREAKFAST   montelukast (SINGULAIR) 10 MG tablet TAKE 1 TABLET EVERY DAY   nitroGLYCERIN (NITROSTAT) 0.4 MG SL tablet  Place 1 tablet (0.4 mg total) under the tongue every 5 (five) minutes as needed for chest pain (Up to 3 tablets).   Omega 3-6-9 Fatty Acids (OMEGA 3-6-9 COMPLEX) CAPS Take 1 capsule by mouth daily.   Probiotic Product (PROBIOTIC PO) Take 3 tablets by mouth daily.   ranitidine (ZANTAC) 150 MG capsule Take 1 capsule (150 mg total) by mouth 2 (two) times daily.   sucralfate (CARAFATE) 1 g tablet TAKE 1 TABLET TWICE DAILY   traMADol-acetaminophen (ULTRACET) 37.5-325 MG tablet Take 1 tablet by mouth every 6 (six) hours as needed.   valACYclovir (VALTREX) 1000 MG tablet Take 1 tablet (1,000 mg total) by mouth 3 (three) times daily as needed. Every 8 hours for seven days as needed   vitamin C (ASCORBIC ACID) 250 MG tablet Take 4  tablets by mouth daily.   No facility-administered medications prior to visit.    Review of Systems  Constitutional:  Negative for appetite change, chills, fatigue and fever.  Respiratory:  Negative for chest tightness and shortness of breath.   Cardiovascular:  Negative for chest pain and palpitations.  Gastrointestinal:  Positive for abdominal pain (right side x 3 days). Negative for nausea and vomiting.       Lower abdominal pressure  Neurological:  Negative for dizziness and weakness.      Objective    BP (!) 163/91 (BP Location: Left Arm, Patient Position: Sitting, Cuff Size: Large)    Pulse 60    Temp 98.5 F (36.9 C) (Oral)    Resp 16    Wt 239 lb (108.4 kg)    SpO2 98% Comment: room air   BMI 37.43 kg/m    Physical Exam    General: Appearance:    Mildly obese female in no acute distress  Eyes:    PERRL, conjunctiva/corneas clear, EOM's intact       Lungs:     Clear to auscultation bilaterally, respirations unlabored  Heart:    Normal heart rate. Normal rhythm. No murmurs, rubs, or gallops.    MS:   All extremities are intact.    Neurologic:   Awake, alert, oriented x 3. No apparent focal neurological defect.        Results for orders placed or  performed in visit on 05/14/21  POCT Urinalysis Dipstick  Result Value Ref Range   Color, UA amber    Clarity, UA cloudy    Glucose, UA Negative Negative   Bilirubin, UA small    Ketones, UA negative    Spec Grav, UA 1.015 1.010 - 1.025   Blood, UA moderate (non hemolyzed)    pH, UA 6.5 5.0 - 8.0   Protein, UA Positive (A) Negative   Urobilinogen, UA 0.2 0.2 or 1.0 E.U./dL   Nitrite, UA positive    Leukocytes, UA Moderate (2+) (A) Negative   Appearance     Odor      Assessment & Plan     1. Urinary frequency  - HIV Antibody (routine testing w rflx) - HSV(herpes simplex vrs) 1+2 ab-IgG - valACYclovir (VALTREX) 1000 MG tablet; Take 1 tablet (1,000 mg total) by mouth 2 (two) times daily for 7 days.  Dispense: 14 tablet; Refill: 0  2. Urinary tract infection without hematuria, site unspecified  - Urine Culture - cefdinir (OMNICEF) 300 MG capsule; Take 2 capsules (600 mg total) by mouth daily for 7 days.  Dispense: 14 capsule; Refill: 0  3. Essential hypertension Uncontrolled. Consider adding amlodipine after reviewing labs.   4. Aortic atherosclerosis (HCC) Asymptomatic. Compliant with medication.  Continue aggressive risk factor modification.    5. Non-alcoholic fatty liver disease Check labs today.   6. Type 2 diabetes mellitus without complication, without long-term current use of insulin (HCC)  - Hemoglobin A1c  7. Hyperlipidemia, unspecified hyperlipidemia type She is tolerating atorvastatin well with no adverse effects.   - CBC - Comprehensive metabolic panel - Lipid panel  8. High risk sexual behavior, unspecified type  - Hepatitis C antibody - RPR - Urine cytology ancillary only  9. Cutaneous horn  - Ambulatory referral to Dermatology  10. Hearing problem of both ears She requests referral for hearing tests.  - Ambulatory referral to Audiology  11. Disorder of right Achilles tendon Previously treated by DPM, but she requests referral to orthpedis.   -  Ambulatory referral to Orthopedic Surgery  12. Cervical cancer screening  - Urine cytology ancillary only - Ambulatory referral to Obstetrics / Gynecology      The entirety of the information documented in the History of Present Illness, Review of Systems and Physical Exam were personally obtained by me. Portions of this information were initially documented by the CMA and reviewed by me for thoroughness and accuracy.     Lelon Huh, MD  [facility] [phone] (phone) [phone] (fax)  [place]

## 2021-05-14 NOTE — Telephone Encounter (Signed)
Copied from [place] [id number]. Topic: General - Other >> May 14, 2021  3:41 PM Pawlus, Brayton Layman A wrote: Reason for CRM: Pt called in very rude, stated "she will die while waiting for her lab results" then hung up,  pt was just seen today and just had labs done, pt wanted a call back regarding this and possible medication changes.

## 2021-05-15 ENCOUNTER — Other Ambulatory Visit: Payer: Self-pay | Admitting: Family Medicine

## 2021-05-15 ENCOUNTER — Ambulatory Visit: Payer: Medicare HMO

## 2021-05-15 ENCOUNTER — Other Ambulatory Visit: Payer: Self-pay

## 2021-05-15 DIAGNOSIS — R350 Frequency of micturition: Secondary | ICD-10-CM

## 2021-05-15 DIAGNOSIS — I10 Essential (primary) hypertension: Secondary | ICD-10-CM

## 2021-05-15 DIAGNOSIS — B3731 Acute candidiasis of vulva and vagina: Secondary | ICD-10-CM

## 2021-05-15 LAB — COMPREHENSIVE METABOLIC PANEL
ALT: 20 IU/L (ref 0–32)
AST: 14 IU/L (ref 0–40)
Albumin/Globulin Ratio: 1.6 (ref 1.2–2.2)
Albumin: 4.4 g/dL (ref 3.8–4.8)
Alkaline Phosphatase: 49 IU/L (ref 44–121)
BUN/Creatinine Ratio: 20 (ref 12–28)
BUN: 16 mg/dL (ref 8–27)
Bilirubin Total: 0.7 mg/dL (ref 0.0–1.2)
CO2: 24 mmol/L (ref 20–29)
Calcium: 9.1 mg/dL (ref 8.7–10.3)
Chloride: 103 mmol/L (ref 96–106)
Creatinine, Ser: 0.79 mg/dL (ref 0.57–1.00)
Globulin, Total: 2.8 g/dL (ref 1.5–4.5)
Glucose: 128 mg/dL — ABNORMAL HIGH (ref 70–99)
Potassium: 3.9 mmol/L (ref 3.5–5.2)
Sodium: 142 mmol/L (ref 134–144)
Total Protein: 7.2 g/dL (ref 6.0–8.5)
eGFR: 83 mL/min/{1.73_m2} (ref >59)

## 2021-05-15 LAB — URINE CYTOLOGY ANCILLARY ONLY
Bacterial Vaginitis-Urine: NEGATIVE
Candida Urine: NEGATIVE — AB
Candida Urine: POSITIVE — AB
Chlamydia: NEGATIVE
Comment: NEGATIVE
Comment: NEGATIVE
Comment: NORMAL
Neisseria Gonorrhea: NEGATIVE
Trichomonas: NEGATIVE

## 2021-05-15 LAB — CBC
Hematocrit: 44.4 % (ref 34.0–46.6)
Hemoglobin: 15.9 g/dL (ref 11.1–15.9)
MCH: 33 pg (ref 26.6–33.0)
MCHC: 35.8 g/dL — ABNORMAL HIGH (ref 31.5–35.7)
MCV: 92 fL (ref 79–97)
Platelets: 195 10*3/uL (ref 150–450)
RBC: 4.82 x10E6/uL (ref 3.77–5.28)
RDW: 12 % (ref 11.7–15.4)
WBC: 6.9 10*3/uL (ref 3.4–10.8)

## 2021-05-15 LAB — HEPATITIS C ANTIBODY: Hep C Virus Ab: NONREACTIVE

## 2021-05-15 LAB — HEMOGLOBIN A1C
Est. average glucose Bld gHb Est-mCnc: 128 mg/dL
Hgb A1c MFr Bld: 6.1 % — ABNORMAL HIGH (ref 4.8–5.6)

## 2021-05-15 LAB — LIPID PANEL
Chol/HDL Ratio: 5 ratio — ABNORMAL HIGH (ref 0.0–4.4)
Cholesterol, Total: 205 mg/dL — ABNORMAL HIGH (ref 100–199)
HDL: 41 mg/dL (ref >39)
LDL Chol Calc (NIH): 135 mg/dL — ABNORMAL HIGH (ref 0–99)
Triglycerides: 162 mg/dL — ABNORMAL HIGH (ref 0–149)
VLDL Cholesterol Cal: 29 mg/dL (ref 5–40)

## 2021-05-15 LAB — HIV ANTIBODY (ROUTINE TESTING W REFLEX): HIV Screen 4th Generation wRfx: NONREACTIVE

## 2021-05-15 LAB — RPR: RPR Ser Ql: NONREACTIVE

## 2021-05-15 LAB — HSV(HERPES SIMPLEX VRS) I + II AB-IGG
HSV 1 Glycoprotein G Ab, IgG: 17.3 index — ABNORMAL HIGH (ref 0.00–0.90)
HSV 2 IgG, Type Spec: 8.48 index — ABNORMAL HIGH (ref 0.00–0.90)

## 2021-05-15 MED ORDER — FLUCONAZOLE 150 MG PO TABS: 150.0000 mg | ORAL_TABLET | Freq: Once | ORAL | 0 refills | Status: AC

## 2021-05-15 MED ORDER — AMLODIPINE BESYLATE 2.5 MG PO TABS: 2.5000 mg | ORAL_TABLET | Freq: Every day | ORAL | 1 refills | Status: DC

## 2021-05-15 NOTE — Telephone Encounter (Signed)
I called and spoke with patient yesterday evening. See separate telephone encounter from 05/14/2021.

## 2021-05-15 NOTE — Telephone Encounter (Signed)
Pharmacy advised in separate phone message on 05/14/2021.

## 2021-05-15 NOTE — Telephone Encounter (Signed)
Patient wants a 90 day supply of Valacyclovir sent to [facility]. She says she discussed this with Dr. Caryn Section during her last appointment. She also wants the Amlodipine sent to [place]. She is fine with getting the Diflucan prescription  from [facility].

## 2021-05-16 ENCOUNTER — Other Ambulatory Visit: Payer: Self-pay | Admitting: Family Medicine

## 2021-05-16 DIAGNOSIS — F32A Depression, unspecified: Secondary | ICD-10-CM

## 2021-05-16 DIAGNOSIS — F419 Anxiety disorder, unspecified: Secondary | ICD-10-CM

## 2021-05-16 DIAGNOSIS — E119 Type 2 diabetes mellitus without complications: Secondary | ICD-10-CM

## 2021-05-16 MED ORDER — VALACYCLOVIR HCL 1 G PO TABS: 1000.0000 mg | ORAL_TABLET | Freq: Every day | ORAL | 3 refills | Status: DC

## 2021-05-16 MED ORDER — AMLODIPINE BESYLATE 2.5 MG PO TABS: 2.5000 mg | ORAL_TABLET | Freq: Every day | ORAL | 2 refills | Status: DC

## 2021-05-17 LAB — URINE CULTURE

## 2021-05-18 MED ORDER — METFORMIN HCL ER 500 MG PO TB24: 1000.0000 mg | ORAL_TABLET | Freq: Every day | ORAL | 4 refills | Status: DC

## 2021-05-18 MED ORDER — ESCITALOPRAM OXALATE 20 MG PO TABS: 20.0000 mg | ORAL_TABLET | Freq: Every day | ORAL | 4 refills | Status: DC

## 2021-05-18 MED ORDER — LOSARTAN POTASSIUM-HCTZ 100-25 MG PO TABS: 1.0000 | ORAL_TABLET | Freq: Every day | ORAL | 1 refills | Status: DC

## 2021-05-21 ENCOUNTER — Telehealth: Payer: Self-pay | Admitting: Family Medicine

## 2021-05-21 ENCOUNTER — Telehealth: Payer: Self-pay

## 2021-05-21 DIAGNOSIS — L858 Other specified epidermal thickening: Secondary | ICD-10-CM

## 2021-05-21 DIAGNOSIS — M79671 Pain in right foot: Secondary | ICD-10-CM

## 2021-05-21 DIAGNOSIS — H9193 Unspecified hearing loss, bilateral: Secondary | ICD-10-CM

## 2021-05-21 DIAGNOSIS — M79672 Pain in left foot: Secondary | ICD-10-CM

## 2021-05-21 NOTE — Telephone Encounter (Signed)
Misty Bishop can her referral to Derm be change to Dr. Aubery Lapping instead of [facility] ?

## 2021-05-21 NOTE — Telephone Encounter (Signed)
Copied from [place] [phone]. Topic: Referral - Request for Referral ?>> May 21, 2021 12:55 PM Leward Quan A wrote: ?Has patient seen PCP for this complaint? Yes.   ?*If NO, is insurance requiring patient see PCP for this issue before PCP can refer them? ?Referral for which specialty: Dermatology  ?Preferred provider/office: [facility]  ?Reason for referral: ?

## 2021-05-21 NOTE — Telephone Encounter (Signed)
Please Review and advise 

## 2021-05-21 NOTE — Addendum Note (Signed)
Addended by: Doristine Devoid on: 05/21/2021 04:35 PM ? ? Modules accepted: Orders ? ?

## 2021-05-21 NOTE — Telephone Encounter (Signed)
Per Fara Olden have attached an appointment . Will you put in another referral?" ? ?Referral placed. ?

## 2021-05-21 NOTE — Telephone Encounter (Signed)
Copied from [place]. Topic: Referral - Request for Referral ?>> May 21, 2021 12:58 PM Leward Quan A wrote: ?Has patient seen PCP for this complaint? Yes.   ?*If NO, is insurance requiring patient see PCP for this issue before PCP can refer them? ?Referral for which specialty: ENT  ?Preferred provider/office: none chosen  ?Reason for referral: Audiologic evaluation ?

## 2021-05-21 NOTE — Telephone Encounter (Signed)
Referral for follow up with Podiatrist placed. Patient is scheduled for her appointment tomorrow with Dr. Amalia Hailey at 11:00 am. ? ?Per Andria Frames the GYN offices are booked a while out locally. If she wants to go somewhere else I just need to know where. ?She will not need new referral for gyn unless she is going somewhere else. ?Called patient LMTCB to double check. ?

## 2021-05-21 NOTE — Telephone Encounter (Signed)
Copied from [place] [id number]. Topic: General - Other ?>> May 21, 2021 12:44 PM Leward Quan A wrote: ?Reason for CRM: Patient called in to inform Dr Caryn Section that she have and appointment on 05/22/21 at 11 AM and need a referral sent over today so she does not end up with a bill. Patient also stated that GYN is not able to get her in until august and need a new referral placed. Please advise and call patient at Ph# [phone] ?

## 2021-05-22 ENCOUNTER — Ambulatory Visit (INDEPENDENT_AMBULATORY_CARE_PROVIDER_SITE_OTHER): Payer: Medicare HMO

## 2021-05-22 ENCOUNTER — Ambulatory Visit: Payer: Medicare HMO | Admitting: Podiatry

## 2021-05-22 ENCOUNTER — Other Ambulatory Visit: Payer: Self-pay

## 2021-05-22 DIAGNOSIS — M7662 Achilles tendinitis, left leg: Secondary | ICD-10-CM | POA: Diagnosis not present

## 2021-05-22 DIAGNOSIS — S86012S Strain of left Achilles tendon, sequela: Secondary | ICD-10-CM

## 2021-05-22 NOTE — Telephone Encounter (Signed)
Per Lauris Chroman she does not want appointment at [facility] she will need to cancel it" ?She also left a message for patient to call her back. ?

## 2021-05-22 NOTE — Telephone Encounter (Signed)
Okay to approval referral for ENT? KW ?

## 2021-05-22 NOTE — Telephone Encounter (Signed)
Pt's appointment with GYN is April 2023 ?She wants to go to [facility] Derm[facility] because she can get in sooner. This has been done ?She is also requesting a referral to [facility] ENT for decreased hearing ?

## 2021-05-22 NOTE — Progress Notes (Signed)
? ?HPI: 66 y.o. female presenting today for evaluation of chronic left Achilles pain.  Patient was last seen in the office 07/20/2019.  She says that after the initial visit she was doing very well and feeling good when she went to vacation and she was dancing and she heard a pop in the back of her leg.  She was seen by 2 different orthopedic doctors in the [place] who put her in a boot and did not do any additional imaging or treatment for the patient.  She says that for the last year and a half she has had pain and tenderness to the posterior ankle as well as atrophy of the calf muscle.  She also has balance issues associated to the ruptured Achilles.  She presents for further treatment and evaluation ? ?Past Medical History:  ?Diagnosis Date  ? Bipolar disorder ([place])   ? Chronic gastritis with bleeding   ? Depression   ? Diabetes mellitus without complication ([place])   ? Headache(784.0)   ? Hypertension   ? Palpitations   ? Holter monitor in 2008 showed PVCs  ? Peptic ulcer disease   ? PTSD (post-traumatic stress disorder)   ? ? ?Past Surgical History:  ?Procedure Laterality Date  ? BREAST BIOPSY    ? Left, benign  ? CESAREAN SECTION    ? KNEE SURGERY    ? Left  ? SHOULDER SURGERY    ? Left  ? TONSILLECTOMY    ? ? ?Allergies  ?Allergen Reactions  ? Levofloxacin Anaphylaxis  ? Levaquin  [Levofloxacin In D5w]   ?  lip swelling, SOB.  ? Penicillins   ?  Pt tolerates Cefdinir ?Tolerated cefdinir so unsure if she is allergic  ? Sulfa Antibiotics Other (See Comments) and Itching  ? ?  ?Physical Exam: ?General: The patient is alert and oriented x3 in no acute distress. ? ?Dermatology: Skin is warm, dry and supple bilateral lower extremities. Negative for open lesions or macerations. ? ?Vascular: Palpable pedal pulses bilaterally. Capillary refill within normal limits.  Negative for any significant edema or erythema ? ?Neurological: Light touch and protective threshold grossly intact ? ?Musculoskeletal Exam: Loss of  plantarflexion strength noted left lower extremity.  Calf atrophy also noted.  There is pain on palpation along the midportion of the Achilles tendon about 5 cm proximal to the insertion with a palpable dell. ? ?Radiographic Exam:  ?Normal osseous mineralization.  Lateral view does indicate disruption of the Achilles tendon about 1.5 cm approximately 5 cm proximal to the insertion of the Achilles into the calcaneus. ? ?Assessment: ?1.  Achilles tendon rupture; chronic, left with retraction ? ? ?Plan of Care:  ?1. Patient evaluated. X-Rays reviewed.  ?2.  The injury is about 18.66 years old.  We are going to order an MRI of the left ankle to determine the extent of retraction.  We did discuss possible surgery to reapproximate the Achilles tendon and repair even though it has been 66 years old. ?3.  Return to clinic after MRI to review results and discuss different treatment options both surgical and conservative ? ?  ?  ?[name], DPM ?[place] ? ?Dr. [name], DPM  ?  ?[address]                                        ?[place],  [address]                ?  Office [phone]  ?Fax [phone] ? ? ? ? ?

## 2021-05-23 ENCOUNTER — Telehealth: Payer: Self-pay | Admitting: Family Medicine

## 2021-05-23 DIAGNOSIS — G8929 Other chronic pain: Secondary | ICD-10-CM

## 2021-05-23 MED ORDER — DICLOFENAC SODIUM 75 MG PO TBEC: 75.0000 mg | DELAYED_RELEASE_TABLET | Freq: Two times a day (BID) | ORAL | 0 refills | Status: DC | PRN

## 2021-05-23 NOTE — Telephone Encounter (Signed)
Pt says that she is still awaiting her Rx (diclofenac (VOLTAREN) 75 MG EC tablet ) via mail order. Pt would like to know if provider could supply her with another supply until she receives her Rx?  ? ?Pharmacy:  ?[facility], [place] [address] AT [place] Phone:  [phone]  ?Fax:  [phone]  ?  ? ? ? ? ?

## 2021-05-24 ENCOUNTER — Telehealth: Payer: Self-pay | Admitting: Family Medicine

## 2021-05-24 NOTE — Telephone Encounter (Signed)
Dr. Caryn Section is out of the office today. Please review and advise on patient request for dosage change for Valtrex.  ?

## 2021-05-24 NOTE — Telephone Encounter (Signed)
Medication Refill - Medication:  ?valACYclovir (VALTREX) 1000 MG tablet ? ?*Pt called in stating the '1000MG'$  is coming out to $80 and its to much for her, until she gets paid on Wednesday, pt is requesting if a '500MG'$  could be sent over so she can afford it, for a 10 day supply* ? ?Has the patient contacted their pharmacy? Yes.   ?Contact PCP ? ?Preferred Pharmacy (with phone number or street name):  ?[facility], [place] [address] AT [place] Phone:  [phone]  ?Fax:  [phone]  ?  ? ? ?Has the patient been seen for an appointment in the last year OR does the patient have an upcoming appointment? Yes.   ? ?Agent: Please be advised that RX refills may take up to 3 business days. We ask that you follow-up with your pharmacy. ?

## 2021-05-25 ENCOUNTER — Telehealth: Payer: Self-pay

## 2021-05-25 ENCOUNTER — Other Ambulatory Visit: Payer: Self-pay | Admitting: Family Medicine

## 2021-05-25 DIAGNOSIS — R350 Frequency of micturition: Secondary | ICD-10-CM

## 2021-05-25 MED ORDER — VALACYCLOVIR HCL 500 MG PO TABS: 1000.0000 mg | ORAL_TABLET | Freq: Two times a day (BID) | ORAL | 0 refills | Status: AC

## 2021-05-25 NOTE — Telephone Encounter (Signed)
Copied from [place]. Topic: General - Other ?>> May 25, 2021  9:15 AM Tessa Lerner A wrote: ?Reason for CRM: The patient has called for an update on their valACYclovir (VALTREX) 1000 MG tablet prescription  ? ?The patient shares that they're continuing to experience their discomfort and potential infection  ? ?Please contact further when possible ?

## 2021-05-25 NOTE — Telephone Encounter (Signed)
Please review and advise on patient's request for dosage change below.  ?

## 2021-05-25 NOTE — Telephone Encounter (Signed)
Patient advised.

## 2021-05-25 NOTE — Telephone Encounter (Signed)
Duplicate message. See phone message from 05/25/2021. ?

## 2021-05-25 NOTE — Telephone Encounter (Signed)
Copied from [place]. Topic: General - Other ?>> May 25, 2021  9:15 AM Tessa Lerner A wrote: ?Reason for CRM: The patient has called for an update on their valACYclovir (VALTREX) 1000 MG tablet prescription  ?  ?The patient shares that they're continuing to experience their discomfort and potential infection  ?  ?Please contact further when possible ?  ?

## 2021-05-28 DIAGNOSIS — L298 Other pruritus: Secondary | ICD-10-CM | POA: Diagnosis not present

## 2021-05-28 DIAGNOSIS — B078 Other viral warts: Secondary | ICD-10-CM | POA: Diagnosis not present

## 2021-05-28 DIAGNOSIS — D485 Neoplasm of uncertain behavior of skin: Secondary | ICD-10-CM | POA: Diagnosis not present

## 2021-05-28 DIAGNOSIS — L821 Other seborrheic keratosis: Secondary | ICD-10-CM | POA: Diagnosis not present

## 2021-05-28 DIAGNOSIS — L918 Other hypertrophic disorders of the skin: Secondary | ICD-10-CM | POA: Diagnosis not present

## 2021-05-28 DIAGNOSIS — L538 Other specified erythematous conditions: Secondary | ICD-10-CM | POA: Diagnosis not present

## 2021-05-30 ENCOUNTER — Telehealth: Payer: Self-pay

## 2021-05-30 NOTE — Telephone Encounter (Signed)
-----   Message from [name], DPM sent at 05/22/2021 11:58 AM EST ----- ?Regarding: MRI LT ankle ?Order placed for an MRI at [facility].  Thanks, Dr. Amalia Hailey ? ?

## 2021-05-30 NOTE — Telephone Encounter (Signed)
MRI approved from 05/30/2021 to 06/29/2021 ?Auth # [id number] ? ?Patient has been notified and will call to schedule appt to her convenience  ?

## 2021-06-01 DIAGNOSIS — S86002A Unspecified injury of left Achilles tendon, initial encounter: Secondary | ICD-10-CM | POA: Diagnosis not present

## 2021-06-10 ENCOUNTER — Ambulatory Visit: Payer: Medicare HMO

## 2021-06-12 ENCOUNTER — Encounter: Payer: Self-pay | Admitting: Obstetrics and Gynecology

## 2021-06-12 ENCOUNTER — Other Ambulatory Visit (HOSPITAL_COMMUNITY)
Admission: RE | Admit: 2021-06-12 | Discharge: 2021-06-12 | Disposition: A | Discharge status: Home / Self Care | Payer: Medicare HMO | Source: Ambulatory Visit | Attending: Obstetrics and Gynecology | Admitting: Obstetrics and Gynecology

## 2021-06-12 ENCOUNTER — Ambulatory Visit (INDEPENDENT_AMBULATORY_CARE_PROVIDER_SITE_OTHER): Payer: Medicare HMO | Admitting: Obstetrics and Gynecology

## 2021-06-12 VITALS — BP 138/82 | HR 62 | Ht 67.0 in | Wt 241.6 lb

## 2021-06-12 DIAGNOSIS — R102 Pelvic and perineal pain: Secondary | ICD-10-CM | POA: Diagnosis not present

## 2021-06-12 DIAGNOSIS — Z7689 Persons encountering health services in other specified circumstances: Secondary | ICD-10-CM | POA: Diagnosis not present

## 2021-06-12 DIAGNOSIS — N898 Other specified noninflammatory disorders of vagina: Secondary | ICD-10-CM | POA: Insufficient documentation

## 2021-06-12 DIAGNOSIS — F319 Bipolar disorder, unspecified: Secondary | ICD-10-CM | POA: Diagnosis not present

## 2021-06-12 DIAGNOSIS — Z113 Encounter for screening for infections with a predominantly sexual mode of transmission: Secondary | ICD-10-CM | POA: Insufficient documentation

## 2021-06-12 LAB — POCT URINALYSIS DIPSTICK
Bilirubin, UA: NEGATIVE
Blood, UA: NEGATIVE
Glucose, UA: NEGATIVE
Ketones, UA: NEGATIVE
Nitrite, UA: NEGATIVE
Protein, UA: NEGATIVE
Spec Grav, UA: 1.015 (ref 1.010–1.025)
Urobilinogen, UA: 0.2 E.U./dL
pH, UA: 6.5 (ref 5.0–8.0)

## 2021-06-12 NOTE — Progress Notes (Signed)
Patient presents today for vaginal irration and pelvic pressure. She states vaginal discharge, irritation and pressure symptoms are present today. Patient states concerns of possible UTI or yeast infection. Patient has no other questions at this time. ?

## 2021-06-12 NOTE — Progress Notes (Signed)
HPI: ?     Misty Bishop is a 66 y.o. G2P1 who LMP was No LMP recorded. Patient is postmenopausal. ? ?Subjective:  ? ?She presents today with multiple ongoing complaints.  In February she noted that she had urinary urgency and vaginal symptoms.  She was subsequently diagnosed with Candida infection in her urine.  She was treated with 1 dose of Diflucan.  She states that it seems to get better but now she is experiencing all of the same symptoms.  She is also experiencing vaginal discharge with odor.  She reports no new sexual partners but is not opposed to being "tested". ?She is currently taking HRT. ? ?  Hx: ?The following portions of the patient's history were reviewed and updated as appropriate: ?            She  has a past medical history of Bipolar disorder ([place]), Chronic gastritis with bleeding, Depression, Diabetes mellitus without complication ([place]), [id number](785.8), Hypertension, Palpitations, Peptic ulcer disease, and PTSD (post-traumatic stress disorder). ?She does not have any pertinent problems on file. ?She  has a past surgical history that includes Cesarean section; Breast biopsy; Tonsillectomy; Knee surgery; and Shoulder surgery. ?Her family history includes COPD in her father and sister; Cancer in her father and maternal aunt; Diabetes in her sister; Heart attack (age of onset: 49) in her mother; Heart disease in her sister; Obesity in her sister; Schizophrenia in her sister. ?She  reports that she has been smoking cigarettes. She has a 51.00 pack-year smoking history. She has never used smokeless tobacco. She reports current alcohol use. She reports current drug use. Drug: Marijuana. ?She has a current medication list which includes the following prescription(s): accu-chek fastclix lancets, accu-chek smartview, albuterol, amlodipine, aspirin, atorvastatin, b complex vitamins, clonazepam, dexlansoprazole, diclofenac, diclofenac sodium, escitalopram, estradiol, fluticasone,  hydrocodone-acetaminophen, loratadine, losartan-hydrochlorothiazide, medroxyprogesterone, meloxicam, metformin, montelukast, nitroglycerin, omega 3-6-9 complex, probiotic product, ranitidine, sucralfate, tramadol-acetaminophen, valacyclovir, and vitamin c. ?She is allergic to levofloxacin, levaquin  [levofloxacin in d5w], penicillins, and sulfa antibiotics. ?      ?Review of Systems:  ?Review of Systems ? ?Constitutional: Denied constitutional symptoms, night sweats, recent illness, fatigue, fever, insomnia and weight loss.  ?Eyes: Denied eye symptoms, eye pain, photophobia, vision change and visual disturbance.  ?Ears/Nose/Throat/Neck: Denied ear, nose, throat or neck symptoms, hearing loss, nasal discharge, sinus congestion and sore throat.  ?Cardiovascular: Denied cardiovascular symptoms, arrhythmia, chest pain/pressure, edema, exercise intolerance, orthopnea and palpitations.  ?Respiratory: Denied pulmonary symptoms, asthma, pleuritic pain, productive sputum, cough, dyspnea and wheezing.  ?Gastrointestinal: Denied, gastro-esophageal reflux, melena, nausea and vomiting.  ?Genitourinary: See HPI for additional information.  ?Musculoskeletal: Denied musculoskeletal symptoms, stiffness, swelling, muscle weakness and myalgia.  ?Dermatologic: Denied dermatology symptoms, rash and scar.  ?Neurologic: Denied neurology symptoms, dizziness, headache, neck pain and syncope.  ?Psychiatric: Denied psychiatric symptoms, anxiety and depression.  ?Endocrine: Denied endocrine symptoms including hot flashes and night sweats.  ? ?Meds: ?  ?Current Outpatient Medications on File Prior to Visit  ?Medication Sig Dispense Refill  ? Accu-Chek FastClix Lancets MISC CHECK BLOOD SUGAR EVERY DAY 102 each 1  ? ACCU-CHEK SMARTVIEW test strip CHECK BLOOD SUGAR EVERY DAY 100 strip 1  ? albuterol (VENTOLIN HFA) 108 (90 Base) MCG/ACT inhaler Inhale 2 puffs into the lungs every 6 (six) hours as needed. 54 g 1  ? amLODipine (NORVASC) 2.5 MG tablet  Take 1 tablet (2.5 mg total) by mouth daily. 90 tablet 2  ? aspirin 81 MG EC tablet Take 81 mg  by mouth daily.    ? atorvastatin (LIPITOR) 40 MG tablet TAKE 1 TABLET EVERY DAY 90 tablet 4  ? b complex vitamins capsule Take 1 capsule by mouth Once PRN.    ? clonazePAM (KLONOPIN) 1 MG tablet Take 1-2 tablets daily and 2 tablets at bedtime. 90 tablet 1  ? dexlansoprazole (DEXILANT) 60 MG capsule Take 1 capsule (60 mg total) daily by mouth. 90 capsule 4  ? diclofenac (VOLTAREN) 75 MG EC tablet Take 1 tablet (75 mg total) by mouth 2 (two) times daily as needed. 180 tablet 0  ? diclofenac Sodium (VOLTAREN) 1 % GEL Apply 1 application topically 4 (four) times daily as needed. 300 g 4  ? escitalopram (LEXAPRO) 20 MG tablet Take 1 tablet (20 mg total) by mouth daily. 90 tablet 4  ? estradiol (ESTRACE) 0.5 MG tablet TAKE 1 TABLET EVERY DAY 90 tablet 1  ? fluticasone (FLONASE) 50 MCG/ACT nasal spray Place 2 sprays into both nostrils daily. 48 g 4  ? HYDROcodone-acetaminophen (NORCO/VICODIN) 5-325 MG tablet Take 1-2 tablets by mouth every 6 (six) hours as needed for moderate pain. 120 tablet 0  ? loratadine (CLARITIN) 10 MG tablet Take 1 tablet by mouth daily.    ? losartan-hydrochlorothiazide (HYZAAR) 100-25 MG tablet Take 1 tablet by mouth daily. 90 tablet 1  ? medroxyPROGESTERone (PROVERA) 2.5 MG tablet TAKE 1 TABLET EVERY DAY 90 tablet 3  ? meloxicam (MOBIC) 15 MG tablet Take 1 tablet (15 mg total) by mouth daily as needed. 90 tablet 3  ? metFORMIN (GLUCOPHAGE-XR) 500 MG 24 hr tablet Take 2 tablets (1,000 mg total) by mouth daily with breakfast. 180 tablet 4  ? montelukast (SINGULAIR) 10 MG tablet TAKE 1 TABLET EVERY DAY 90 tablet 4  ? nitroGLYCERIN (NITROSTAT) 0.4 MG SL tablet Place 1 tablet (0.4 mg total) under the tongue every 5 (five) minutes as needed for chest pain (Up to 3 tablets). 30 tablet 2  ? Omega 3-6-9 Fatty Acids (OMEGA 3-6-9 COMPLEX) CAPS Take 1 capsule by mouth daily.    ? Probiotic Product (PROBIOTIC PO)  Take 3 tablets by mouth daily.    ? ranitidine (ZANTAC) 150 MG capsule Take 1 capsule (150 mg total) by mouth 2 (two) times daily. 180 capsule 4  ? sucralfate (CARAFATE) 1 g tablet TAKE 1 TABLET TWICE DAILY 180 tablet 4  ? traMADol-acetaminophen (ULTRACET) 37.5-325 MG tablet Take 1 tablet by mouth every 6 (six) hours as needed. 180 tablet 0  ? valACYclovir (VALTREX) 1000 MG tablet Take 1 tablet (1,000 mg total) by mouth daily. 90 tablet 3  ? vitamin C (ASCORBIC ACID) 250 MG tablet Take 4 tablets by mouth daily.    ? ?No current facility-administered medications on file prior to visit.  ? ? ? ? ?Objective:  ?  ? ?Vitals:  ? 06/12/21 0918  ?BP: 138/82  ?Pulse: 62  ? ?Filed Weights  ? 06/12/21 0918  ?Weight: 241 lb 9.6 oz (109.6 kg)  ? ?  ?         UA today-negative ?Physical examination ?  Pelvic:   ?Vulva: Normal appearance.  No lesions.  ?Vagina: No lesions or abnormalities noted.  ?Support: Moderate relaxation  ?Urethra No masses tenderness or scarring.  ?Meatus Normal size without lesions or prolapse.  ?Cervix: Normal appearance.  No lesions.  ?Anus: Normal exam.  No lesions.  ?Perineum: Normal exam.  No lesions.  ?      Bimanual   ?Uterus: Normal size.  Non-tender.  Mobile.  AV.  ?Adnexae: No masses.  Non-tender to palpation.  ?Cul-de-sac: Negative for abnormality.  ? ? ?        ? ?Assessment:  ?  ?G2P1 ?Patient Active Problem List  ? Diagnosis Date Noted  ? Aortic atherosclerosis ([place]) 03/03/2017  ? Chronic fatigue syndrome 11/30/2015  ? Hyperlipemia   ? Bipolar disorder ([place])   ? Anxiety 11/08/2014  ? COPD (chronic obstructive pulmonary disease) ([place]) 11/08/2014  ? Degeneration of lumbar or lumbosacral intervertebral disc 11/08/2014  ? Diabetes mellitus, type 2 ([place]) 11/08/2014  ? Difficulty hearing 11/08/2014  ? Symptomatic menopausal or female climacteric states 11/08/2014  ? Sciatica 11/08/2014  ? Chronic sinusitis 11/08/2014  ? Non-alcoholic fatty liver disease 11/08/2014  ? Smoking greater than 30 pack  years 11/08/2014  ? Tremor 11/08/2014  ? Cutaneous skin tags 11/08/2014  ? GERD (gastroesophageal reflux disease) 08/19/2014  ? ANA positive 12/29/2013  ? Arthritis, degenerative 12/29/2013  ? SHORTNESS OF BREATH 04/

## 2021-06-14 ENCOUNTER — Other Ambulatory Visit: Payer: Self-pay

## 2021-06-14 ENCOUNTER — Other Ambulatory Visit: Payer: Self-pay | Admitting: Obstetrics and Gynecology

## 2021-06-14 DIAGNOSIS — B379 Candidiasis, unspecified: Secondary | ICD-10-CM

## 2021-06-14 LAB — CERVICOVAGINAL ANCILLARY ONLY
Bacterial Vaginitis (gardnerella): NEGATIVE
Candida Glabrata: POSITIVE — AB
Candida Vaginitis: NEGATIVE
Chlamydia: NEGATIVE
Comment: NEGATIVE
Comment: NEGATIVE
Comment: NEGATIVE
Comment: NEGATIVE
Comment: NEGATIVE
Comment: NORMAL
Neisseria Gonorrhea: NEGATIVE
Trichomonas: NEGATIVE

## 2021-06-14 MED ORDER — FLUCONAZOLE 150 MG PO TABS: 150.0000 mg | ORAL_TABLET | ORAL | 3 refills | Status: DC

## 2021-06-14 NOTE — Progress Notes (Signed)
Jayli: ?As we suspected at the time of your visit, you still have a monilia/yeast infection.  This is vaginal but I would not be surprised if it was also in your bladder as before. ?I have sent in a prescription for Diflucan and I would like you to take it as instructed for 3 doses this time.

## 2021-06-14 NOTE — Progress Notes (Signed)
Forwarded to patient via MyChart ?

## 2021-06-15 LAB — URINE CULTURE

## 2021-06-19 DIAGNOSIS — R69 Illness, unspecified: Secondary | ICD-10-CM | POA: Diagnosis not present

## 2021-06-20 DIAGNOSIS — R300 Dysuria: Secondary | ICD-10-CM | POA: Diagnosis not present

## 2021-06-20 DIAGNOSIS — E119 Type 2 diabetes mellitus without complications: Secondary | ICD-10-CM | POA: Diagnosis not present

## 2021-06-20 DIAGNOSIS — I10 Essential (primary) hypertension: Secondary | ICD-10-CM | POA: Diagnosis not present

## 2021-06-20 DIAGNOSIS — Z79899 Other long term (current) drug therapy: Secondary | ICD-10-CM | POA: Diagnosis not present

## 2021-06-20 DIAGNOSIS — J449 Chronic obstructive pulmonary disease, unspecified: Secondary | ICD-10-CM | POA: Diagnosis not present

## 2021-06-20 DIAGNOSIS — F17210 Nicotine dependence, cigarettes, uncomplicated: Secondary | ICD-10-CM | POA: Diagnosis not present

## 2021-06-20 DIAGNOSIS — R350 Frequency of micturition: Secondary | ICD-10-CM | POA: Diagnosis not present

## 2021-06-20 DIAGNOSIS — Z7951 Long term (current) use of inhaled steroids: Secondary | ICD-10-CM | POA: Diagnosis not present

## 2021-06-20 DIAGNOSIS — Z7982 Long term (current) use of aspirin: Secondary | ICD-10-CM | POA: Diagnosis not present

## 2021-06-21 ENCOUNTER — Telehealth: Payer: Self-pay | Admitting: Family Medicine

## 2021-06-21 NOTE — Telephone Encounter (Signed)
Copied from [place] [id number]. Topic: General - Other ?>> Jun 21, 2021 12:50 PM Leward Quan A wrote: ?Reason for CRM: Pamella Pert with [facility] called in to inform Dr Caryn Section that they faxed over recommendations for patient to be put on Statin medication since she is diabetic and it is recommended by the [facility] that she be on statin. Any questions please call Pamella Pert at Ph# [phone] ?

## 2021-06-22 ENCOUNTER — Ambulatory Visit: Payer: Medicare HMO | Admitting: Family Medicine

## 2021-06-22 ENCOUNTER — Other Ambulatory Visit: Payer: Self-pay | Admitting: Family Medicine

## 2021-06-22 DIAGNOSIS — I10 Essential (primary) hypertension: Secondary | ICD-10-CM

## 2021-06-27 ENCOUNTER — Encounter: Payer: Medicare HMO | Admitting: Obstetrics and Gynecology

## 2021-07-05 ENCOUNTER — Other Ambulatory Visit: Payer: Self-pay | Admitting: Family Medicine

## 2021-07-05 DIAGNOSIS — M51379 Other intervertebral disc degeneration, lumbosacral region without mention of lumbar back pain or lower extremity pain: Secondary | ICD-10-CM

## 2021-07-05 DIAGNOSIS — M5137 Other intervertebral disc degeneration, lumbosacral region: Secondary | ICD-10-CM

## 2021-07-05 DIAGNOSIS — R079 Chest pain, unspecified: Secondary | ICD-10-CM

## 2021-07-05 MED ORDER — NITROGLYCERIN 0.4 MG SL SUBL: 0.4000 mg | SUBLINGUAL_TABLET | SUBLINGUAL | 2 refills | Status: DC | PRN

## 2021-07-05 MED ORDER — HYDROCODONE-ACETAMINOPHEN 5-325 MG PO TABS: 1.0000 | ORAL_TABLET | Freq: Four times a day (QID) | ORAL | 0 refills | Status: DC | PRN

## 2021-07-05 NOTE — Telephone Encounter (Signed)
Last refill:   ?Hydrocodone 05/03/2021 # 120 tabs with 0 refills.  ?Nitroglycerine  08/16/2020 # 30 with 2 refills ?   ? ?Last office visit: 05/14/2021 ?Next office visit: no future visit scheduled with PCP  ? ?

## 2021-07-05 NOTE — Telephone Encounter (Signed)
Med refill sent into [place], [address] [facility] [address] ? ?HYDROcodone-acetaminophen (NORCO/VICODIN) 5-325 MG tablet ? ?nitroGLYCERIN (NITROSTAT) 0.4 MG SL tablet ? ?

## 2021-07-10 ENCOUNTER — Ambulatory Visit (INDEPENDENT_AMBULATORY_CARE_PROVIDER_SITE_OTHER): Payer: Medicare HMO

## 2021-07-10 VITALS — Wt 238.0 lb

## 2021-07-10 DIAGNOSIS — Z1231 Encounter for screening mammogram for malignant neoplasm of breast: Secondary | ICD-10-CM | POA: Diagnosis not present

## 2021-07-10 DIAGNOSIS — Z0000 Encounter for general adult medical examination without abnormal findings: Secondary | ICD-10-CM

## 2021-07-10 DIAGNOSIS — Z780 Asymptomatic menopausal state: Secondary | ICD-10-CM | POA: Diagnosis not present

## 2021-07-10 DIAGNOSIS — Z1211 Encounter for screening for malignant neoplasm of colon: Secondary | ICD-10-CM

## 2021-07-10 NOTE — Patient Instructions (Signed)
Misty Bishop , ?Thank you for taking time to come for your Medicare Wellness Visit. I appreciate your ongoing commitment to your health goals. Please review the following plan we discussed and let me know if I can assist you in the future.  ? ?Screening recommendations/referrals: ?Colonoscopy: referral sent ?Mammogram: referral sent ?Bone Density: referral sent ?Recommended yearly ophthalmology/optometry visit for glaucoma screening and checkup ?Recommended yearly dental visit for hygiene and checkup ? ?Vaccinations: ?Influenza vaccine: n/d ?Pneumococcal vaccine: 02/07/17, needs second shot ?Tdap vaccine: 01/14/07, due ?Shingles vaccine: n/d   ?Covid-19:07/05/19, 07/27/19, 01/25/20 ? ?Advanced directives: no ? ?Conditions/risks identified: none ? ?Next appointment: Follow up in one year for your annual wellness visit 07/15/22 @ 8:15am by phone ? ? ?Preventive Care 66 Years and Older, Female ?Preventive care refers to lifestyle choices and visits with your health care provider that can promote health and wellness. ?What does preventive care include? ?A yearly physical exam. This is also called an annual well check. ?Dental exams once or twice a year. ?Routine eye exams. Ask your health care provider how often you should have your eyes checked. ?Personal lifestyle choices, including: ?Daily care of your teeth and gums. ?Regular physical activity. ?Eating a healthy diet. ?Avoiding tobacco and drug use. ?Limiting alcohol use. ?Practicing safe sex. ?Taking low-dose aspirin every day. ?Taking vitamin and mineral supplements as recommended by your health care provider. ?What happens during an annual well check? ?The services and screenings done by your health care provider during your annual well check will depend on your age, overall health, lifestyle risk factors, and family history of disease. ?Counseling  ?Your health care provider may ask you questions about your: ?Alcohol use. ?Tobacco use. ?Drug use. ?Emotional  well-being. ?Home and relationship well-being. ?Sexual activity. ?Eating habits. ?History of falls. ?Memory and ability to understand (cognition). ?Work and work Statistician. ?Reproductive health. ?Screening  ?You may have the following tests or measurements: ?Height, weight, and BMI. ?Blood pressure. ?Lipid and cholesterol levels. These may be checked every 5 years, or more frequently if you are over 16 years old. ?Skin check. ?Lung cancer screening. You may have this screening every year starting at age 38 if you have a 30-pack-year history of smoking and currently smoke or have quit within the past 15 years. ?Fecal occult blood test (FOBT) of the stool. You may have this test every year starting at age 61. ?Flexible sigmoidoscopy or colonoscopy. You may have a sigmoidoscopy every 5 years or a colonoscopy every 10 years starting at age 62. ?Hepatitis C blood test. ?Hepatitis B blood test. ?Sexually transmitted disease (STD) testing. ?Diabetes screening. This is done by checking your blood sugar (glucose) after you have not eaten for a while (fasting). You may have this done every 1-3 years. ?Bone density scan. This is done to screen for osteoporosis. You may have this done starting at age 3. ?Mammogram. This may be done every 1-2 years. Talk to your health care provider about how often you should have regular mammograms. ?Talk with your health care provider about your test results, treatment options, and if necessary, the need for more tests. ?Vaccines  ?Your health care provider may recommend certain vaccines, such as: ?Influenza vaccine. This is recommended every year. ?Tetanus, diphtheria, and acellular pertussis (Tdap, Td) vaccine. You may need a Td booster every 10 years. ?Zoster vaccine. You may need this after age 31. ?Pneumococcal 13-valent conjugate (PCV13) vaccine. One dose is recommended after age 71. ?Pneumococcal polysaccharide (PPSV23) vaccine. One dose is recommended after age  49. ?Talk to your  health care provider about which screenings and vaccines you need and how often you need them. ?This information is not intended to replace advice given to you by your health care provider. Make sure you discuss any questions you have with your health care provider. ?Document Released: 03/31/2015 Document Revised: 11/22/2015 Document Reviewed: 01/03/2015 ?Elsevier Interactive Patient Education ? 2017 [place]. ? ?Fall Prevention in the Home ?Falls can cause injuries. They can happen to people of all ages. There are many things you can do to make your home safe and to help prevent falls. ?What can I do on the outside of my home? ?Regularly fix the edges of walkways and driveways and fix any cracks. ?Remove anything that might make you trip as you walk through a door, such as a raised step or threshold. ?Trim any bushes or trees on the path to your home. ?Use bright outdoor lighting. ?Clear any walking paths of anything that might make someone trip, such as rocks or tools. ?Regularly check to see if handrails are loose or broken. Make sure that both sides of any steps have handrails. ?Any raised decks and porches should have guardrails on the edges. ?Have any leaves, snow, or ice cleared regularly. ?Use sand or salt on walking paths during winter. ?Clean up any spills in your garage right away. This includes oil or grease spills. ?What can I do in the bathroom? ?Use night lights. ?Install grab bars by the toilet and in the tub and shower. Do not use towel bars as grab bars. ?Use non-skid mats or decals in the tub or shower. ?If you need to sit down in the shower, use a plastic, non-slip stool. ?Keep the floor dry. Clean up any water that spills on the floor as soon as it happens. ?Remove soap buildup in the tub or shower regularly. ?Attach bath mats securely with double-sided non-slip rug tape. ?Do not have throw rugs and other things on the floor that can make you trip. ?What can I do in the bedroom? ?Use night  lights. ?Make sure that you have a light by your bed that is easy to reach. ?Do not use any sheets or blankets that are too big for your bed. They should not hang down onto the floor. ?Have a firm chair that has side arms. You can use this for support while you get dressed. ?Do not have throw rugs and other things on the floor that can make you trip. ?What can I do in the kitchen? ?Clean up any spills right away. ?Avoid walking on wet floors. ?Keep items that you use a lot in easy-to-reach places. ?If you need to reach something above you, use a strong step stool that has a grab bar. ?Keep electrical cords out of the way. ?Do not use floor polish or wax that makes floors slippery. If you must use wax, use non-skid floor wax. ?Do not have throw rugs and other things on the floor that can make you trip. ?What can I do with my stairs? ?Do not leave any items on the stairs. ?Make sure that there are handrails on both sides of the stairs and use them. Fix handrails that are broken or loose. Make sure that handrails are as long as the stairways. ?Check any carpeting to make sure that it is firmly attached to the stairs. Fix any carpet that is loose or worn. ?Avoid having throw rugs at the top or bottom of the stairs. If you do have  throw rugs, attach them to the floor with carpet tape. ?Make sure that you have a light switch at the top of the stairs and the bottom of the stairs. If you do not have them, ask someone to add them for you. ?What else can I do to help prevent falls? ?Wear shoes that: ?Do not have high heels. ?Have rubber bottoms. ?Are comfortable and fit you well. ?Are closed at the toe. Do not wear sandals. ?If you use a stepladder: ?Make sure that it is fully opened. Do not climb a closed stepladder. ?Make sure that both sides of the stepladder are locked into place. ?Ask someone to hold it for you, if possible. ?Clearly mark and make sure that you can see: ?Any grab bars or handrails. ?First and last  steps. ?Where the edge of each step is. ?Use tools that help you move around (mobility aids) if they are needed. These include: ?Canes. ?Walkers. ?Scooters. ?Crutches. ?Turn on the lights when you go into a dark area. Re

## 2021-07-10 NOTE — Progress Notes (Signed)
?Virtual Visit via Telephone Note ? ?I connected with  [place] on 07/10/21 at  8:15 AM EDT by telephone and verified that I am speaking with the correct person using two identifiers. ? ?Location: ?Patient: home ?Provider: [facility] ?Persons participating in the virtual visit: patient/Nurse Health Advisor ?  ?I discussed the limitations, risks, security and privacy concerns of performing an evaluation and management service by telephone and the availability of in person appointments. The patient expressed understanding and agreed to proceed. ? ?Interactive audio and video telecommunications were attempted between this nurse and patient, however failed, due to patient having technical difficulties OR patient did not have access to video capability.  We continued and completed visit with audio only. ? ?Some vital signs may be absent or patient reported.  ? ?Dionisio David, LPN ? ?Subjective:  ? Misty Bishop is a 66 y.o. female who presents for an Initial Medicare Annual Wellness Visit. ? ?Review of Systems    ? ?  ? ?   ?Objective:  ?  ?There were no vitals filed for this visit. ?There is no height or weight on file to calculate BMI. ? ? ?  07/06/2017  ? 12:12 PM 09/12/2015  ?  4:25 PM  ?Advanced Directives  ?Does Patient Have a Medical Advance Directive? No No  ?Would patient like information on creating a medical advance directive? No - Patient declined   ? ? ?Current Medications (verified) ?Outpatient Encounter Medications as of 07/10/2021  ?Medication Sig  ? Accu-Chek FastClix Lancets MISC CHECK BLOOD SUGAR EVERY DAY  ? ACCU-CHEK SMARTVIEW test strip CHECK BLOOD SUGAR EVERY DAY  ? albuterol (VENTOLIN HFA) 108 (90 Base) MCG/ACT inhaler Inhale 2 puffs into the lungs every 6 (six) hours as needed.  ? amLODipine (NORVASC) 2.5 MG tablet TAKE 1 TABLET(2.5 MG) BY MOUTH DAILY  ? aspirin 81 MG EC tablet Take 81 mg by mouth daily.  ? atorvastatin (LIPITOR) 40 MG tablet TAKE 1 TABLET EVERY DAY  ? b complex  vitamins capsule Take 1 capsule by mouth Once PRN.  ? clonazePAM (KLONOPIN) 1 MG tablet Take 1-2 tablets daily and 2 tablets at bedtime.  ? dexlansoprazole (DEXILANT) 60 MG capsule Take 1 capsule (60 mg total) daily by mouth.  ? diclofenac (VOLTAREN) 75 MG EC tablet Take 1 tablet (75 mg total) by mouth 2 (two) times daily as needed.  ? diclofenac Sodium (VOLTAREN) 1 % GEL Apply 1 application topically 4 (four) times daily as needed.  ? escitalopram (LEXAPRO) 20 MG tablet Take 1 tablet (20 mg total) by mouth daily.  ? estradiol (ESTRACE) 0.5 MG tablet TAKE 1 TABLET EVERY DAY  ? fluconazole (DIFLUCAN) 150 MG tablet Take 1 tablet (150 mg total) by mouth every 3 (three) days. For three doses  ? fluticasone (FLONASE) 50 MCG/ACT nasal spray Place 2 sprays into both nostrils daily.  ? HYDROcodone-acetaminophen (NORCO/VICODIN) 5-325 MG tablet Take 1-2 tablets by mouth every 6 (six) hours as needed for moderate pain.  ? loratadine (CLARITIN) 10 MG tablet Take 1 tablet by mouth daily.  ? losartan-hydrochlorothiazide (HYZAAR) 100-25 MG tablet Take 1 tablet by mouth daily.  ? medroxyPROGESTERone (PROVERA) 2.5 MG tablet TAKE 1 TABLET EVERY DAY  ? meloxicam (MOBIC) 15 MG tablet Take 1 tablet (15 mg total) by mouth daily as needed.  ? metFORMIN (GLUCOPHAGE-XR) 500 MG 24 hr tablet Take 2 tablets (1,000 mg total) by mouth daily with breakfast.  ? montelukast (SINGULAIR) 10 MG tablet TAKE 1 TABLET EVERY DAY  ?  nitroGLYCERIN (NITROSTAT) 0.4 MG SL tablet Place 1 tablet (0.4 mg total) under the tongue every 5 (five) minutes as needed for chest pain (Up to 3 tablets).  ? Omega 3-6-9 Fatty Acids (OMEGA 3-6-9 COMPLEX) CAPS Take 1 capsule by mouth daily.  ? Probiotic Product (PROBIOTIC PO) Take 3 tablets by mouth daily.  ? ranitidine (ZANTAC) 150 MG capsule Take 1 capsule (150 mg total) by mouth 2 (two) times daily.  ? sucralfate (CARAFATE) 1 g tablet TAKE 1 TABLET TWICE DAILY  ? traMADol-acetaminophen (ULTRACET) 37.5-325 MG tablet Take 1  tablet by mouth every 6 (six) hours as needed.  ? valACYclovir (VALTREX) 1000 MG tablet Take 1 tablet (1,000 mg total) by mouth daily.  ? vitamin C (ASCORBIC ACID) 250 MG tablet Take 4 tablets by mouth daily.  ? ?No facility-administered encounter medications on file as of 07/10/2021.  ? ? ?Allergies (verified) ?Levofloxacin, Levaquin  [levofloxacin in d5w], Penicillins, and Sulfa antibiotics  ? ?History: ?Past Medical History:  ?Diagnosis Date  ? Bipolar disorder ([place])   ? Chronic gastritis with bleeding   ? Depression   ? Diabetes mellitus without complication ([place])   ? Headache(784.0)   ? Hypertension   ? Palpitations   ? Holter monitor in 2008 showed PVCs  ? Peptic ulcer disease   ? PTSD (post-traumatic stress disorder)   ? ?Past Surgical History:  ?Procedure Laterality Date  ? BREAST BIOPSY    ? Left, benign  ? CESAREAN SECTION    ? KNEE SURGERY    ? Left  ? SHOULDER SURGERY    ? Left  ? TONSILLECTOMY    ? ?Family History  ?Problem Relation Age of Onset  ? Heart attack Mother 75  ?     MI  ? Cancer Father   ?     skin with mets to prostate and brain  ? COPD Father   ? Heart disease Sister   ?     Palpitations  ? Diabetes Sister   ? Obesity Sister   ? Cancer Maternal Aunt   ?     Leukemia  ? Schizophrenia Sister   ? COPD Sister   ? ?Social History  ? ?Socioeconomic History  ? Marital status: Single  ?  Spouse name: Not on file  ? Number of children: 1  ? Years of education: Not on file  ? Highest education level: Not on file  ?Occupational History  ? Occupation: Disabled   ?  Comment: disabeled due to chronic pain in low back- approved in 08/2005  ?Tobacco Use  ? Smoking status: Every Day  ?  Packs/day: 1.50  ?  Years: 34.00  ?  Pack years: 51.00  ?  Types: Cigarettes  ? Smokeless tobacco: Never  ?Vaping Use  ? Vaping Use: Never used  ?Substance and Sexual Activity  ? Alcohol use: Yes  ?  Alcohol/week: 0.0 standard drinks  ?  Comment: occasional use  ? Drug use: Yes  ?  Types: Marijuana  ? Sexual activity: Yes  ?   Birth control/protection: None  ?Other Topics Concern  ? Not on file  ?Social History Narrative  ? Disabled (back pain)  ? Originally from [place]  ? Has a daughter  ? Does not get regular exercise  ? ?Social Determinants of Health  ? ?Financial Resource Strain: Not on file  ?Food Insecurity: Not on file  ?Transportation Needs: Not on file  ?Physical Activity: Not on file  ?Stress: Not on file  ?Social  Connections: Not on file  ? ? ?Tobacco Counseling ?Ready to quit: Not Answered ?Counseling given: Not Answered ? ? ?Clinical Intake: ? ?Pre-visit preparation completed: Yes ? ?Pain : No/denies pain ? ?  ? ?Nutritional Risks: None ?Diabetes: Yes ?CBG done?: No ?Did pt. bring in CBG monitor from home?: No ? ?How often do you need to have someone help you when you read instructions, pamphlets, or other written materials from your doctor or pharmacy?: 1 - Never ? ?Diabetic?yes ?Nutrition Risk Assessment: ? ?Has the patient had any N/V/D within the last 2 months?  Yes  ?Does the patient have any non-healing wounds?  No  ?Has the patient had any unintentional weight loss or weight gain?  No  ? ?Diabetes: ? ?Is the patient diabetic?  Yes  ?If diabetic, was a CBG obtained today?  No  ?Did the patient bring in their glucometer from home?  No  ?How often do you monitor your CBG's? never.  ? ?Financial Strains and Diabetes Management: ? ?Are you having any financial strains with the device, your supplies or your medication? No .  ?Does the patient want to be seen by Chronic Care Management for management of their diabetes?  No  ?Would the patient like to be referred to a Nutritionist or for Diabetic Management?  No  ? ?Diabetic Exams: ? ?Diabetic Eye Exam: Completed late February. Pt has been advised about the importance in completing this exam.  ? ?Diabetic Foot Exam: Completed early March. Pt has been advised about the importance in completing this exam.   ? ? ?Interpreter Needed?: No ? ?Information entered by :: Kirke Shaggy, LPN ? ? ?Activities of Daily Living ? ?  08/15/2020  ? 11:40 AM  ?In your present state of health, do you have any difficulty performing the following activities:  ?Hearing? 1  ?Vision? 1  ?Difficulty

## 2021-08-25 ENCOUNTER — Other Ambulatory Visit: Payer: Self-pay | Admitting: Family Medicine

## 2021-08-25 DIAGNOSIS — G8929 Other chronic pain: Secondary | ICD-10-CM

## 2021-08-28 ENCOUNTER — Other Ambulatory Visit: Payer: Self-pay | Admitting: Family Medicine

## 2021-08-28 DIAGNOSIS — N951 Menopausal and female climacteric states: Secondary | ICD-10-CM

## 2021-08-28 DIAGNOSIS — G8929 Other chronic pain: Secondary | ICD-10-CM

## 2021-08-28 DIAGNOSIS — M25519 Pain in unspecified shoulder: Secondary | ICD-10-CM

## 2021-08-29 ENCOUNTER — Other Ambulatory Visit: Payer: Self-pay | Admitting: Family Medicine

## 2021-08-29 DIAGNOSIS — M5137 Other intervertebral disc degeneration, lumbosacral region: Secondary | ICD-10-CM

## 2021-08-29 DIAGNOSIS — M51379 Other intervertebral disc degeneration, lumbosacral region without mention of lumbar back pain or lower extremity pain: Secondary | ICD-10-CM

## 2021-08-29 NOTE — Telephone Encounter (Signed)
Requested medication (s) are due for refill today: yes  Requested medication (s) are on the active medication list: yes    Last refill: 07/05/21  #120  0 refills  Future visit scheduled No  Notes to clinic: Not delegated. Please review.  Requested Prescriptions  Pending Prescriptions Disp Refills   HYDROcodone-acetaminophen (NORCO/VICODIN) 5-325 MG tablet 120 tablet 0    Sig: Take 1-2 tablets by mouth every 6 (six) hours as needed for moderate pain.     Not Delegated - Analgesics:  Opioid Agonist Combinations Failed - 08/29/2021  9:27 AM      Failed - This refill cannot be delegated      Failed - Urine Drug Screen completed in last 360 days      Failed - Valid encounter within last 3 months    Recent Outpatient Visits           3 months ago Urinary frequency   [facility] Birdie Sons, MD   1 year ago Type 2 diabetes mellitus without complication, without long-term current use of insulin ([place])   [facility] Birdie Sons, MD   1 year ago Type 2 diabetes mellitus without complication, without long-term current use of insulin [facility])   [facility] Birdie Sons, MD   2 years ago Type 2 diabetes mellitus without complication, without long-term current use of insulin [facility])   [facility] Birdie Sons, MD   3 years ago Exposure to measles virus   [place], Kirstie Peri, MD       Future Appointments             In 3 months Amalia Hailey, Nyoka Lint, MD [facility]

## 2021-08-29 NOTE — Telephone Encounter (Signed)
Pt needs appt. Left VM to call back.

## 2021-08-29 NOTE — Telephone Encounter (Signed)
Copied from [place] [id number]. Topic: General - Other >> Aug 29, 2021  9:05 AM [place] C wrote: Reason for CRM: Medication Refill - Medication: HYDROcodone-acetaminophen (NORCO/VICODIN) 5-325 MG tablet [[id number]]    Has the patient contacted their pharmacy? No. (Agent: If no, request that the patient contact the pharmacy for the refill. If patient does not wish to contact the pharmacy document the reason why and proceed with request.) (Agent: If yes, when and what did the pharmacy advise?)  Preferred Pharmacy (with phone number or street name): [place], [place] [address] Phone: [phone] Fax: [phone] Hours: Not open 24 hours   Has the patient been seen for an appointment in the last year OR does the patient have an upcoming appointment? Yes  Agent: Please be advised that RX refills may take up to 3 business days. We ask that you follow-up with your pharmacy.

## 2021-08-30 MED ORDER — HYDROCODONE-ACETAMINOPHEN 5-325 MG PO TABS: 1.0000 | ORAL_TABLET | Freq: Four times a day (QID) | ORAL | 0 refills | Status: DC | PRN

## 2021-10-12 ENCOUNTER — Telehealth: Payer: Self-pay

## 2021-10-12 NOTE — Telephone Encounter (Signed)
Copied from [place] [phone]. Topic: General - Call Back - No Documentation >> Oct 12, 2021 12:41 PM Oley Balm E wrote: Reason for CRM: Sammy from [place] called regarding a fax that was submitted July 11th regarding the patient needing to be on a statin along with her diabetic management.  Best contact: [phone]

## 2021-10-14 ENCOUNTER — Other Ambulatory Visit: Payer: Self-pay | Admitting: Family Medicine

## 2021-10-15 NOTE — Telephone Encounter (Signed)
Pt is already on Atorvastatin 40 mg daily and [employer] pharmacist informed.

## 2021-10-15 NOTE — Telephone Encounter (Signed)
Patient will need to make an appointment for further refills. Requested Prescriptions  Pending Prescriptions Disp Refills  . losartan-hydrochlorothiazide (HYZAAR) 100-25 MG tablet [Pharmacy Med Name: LOSARTAN POTASSIUM/HYDROCHLOROTHIAZIDE 100-25 MG Tablet] 90 tablet 0    Sig: TAKE 1 TABLET EVERY DAY     Cardiovascular: ARB + Diuretic Combos Passed - 10/14/2021  3:38 AM      Passed - K in normal range and within 180 days    Potassium  Date Value Ref Range Status  05/14/2021 3.9 3.5 - 5.2 mmol/L Final  09/02/2011 4.0 3.5 - 5.1 mmol/L Final         Passed - Na in normal range and within 180 days    Sodium  Date Value Ref Range Status  05/14/2021 142 134 - 144 mmol/L Final  09/02/2011 141 136 - 145 mmol/L Final         Passed - Cr in normal range and within 180 days    Creatinine  Date Value Ref Range Status  09/02/2011 1.01 0.60 - 1.30 mg/dL Final   Creatinine, Ser  Date Value Ref Range Status  05/14/2021 0.79 0.57 - 1.00 mg/dL Final         Passed - eGFR is 10 or above and within 180 days    EGFR (African American)  Date Value Ref Range Status  09/02/2011 >60  Final   GFR calc Af Amer  Date Value Ref Range Status  07/12/2019 95 >59 mL/min/1.73 Final    Comment:    **[facility] currently reports eGFR in compliance with the current**   recommendations of the [employer]. [facility] will   update reporting as new guidelines are published from the NKF-ASN   Task force.    EGFR (Non-African Amer.)  Date Value Ref Range Status  09/02/2011 >60  Final    Comment:    eGFR values <24m/min/1.73 m2 may be an indication of chronic kidney disease (CKD). Calculated eGFR is useful in patients with stable renal function. The eGFR calculation will not be reliable in acutely ill patients when serum creatinine is changing rapidly. It is not useful in  patients on dialysis. The eGFR calculation may not be applicable to patients at the low and high extremes of body sizes,  pregnant women, and vegetarians.    GFR calc non Af Amer  Date Value Ref Range Status  07/12/2019 82 >59 mL/min/1.73 Final   eGFR  Date Value Ref Range Status  05/14/2021 83 >59 mL/min/1.73 Final         Passed - Patient is not pregnant      Passed - Last BP in normal range    BP Readings from Last 1 Encounters:  06/12/21 138/82         Passed - Valid encounter within last 6 months    Recent Outpatient Visits          5 months ago [facility]   [facility]   1 year ago Type 2 diabetes mellitus without complication, without long-term current use of insulin ([facility]   [facility]   1 year ago Type 2 diabetes mellitus without complication, without long-term current use of insulin ([facility]   [facility]   2 years ago Type 2 diabetes mellitus without complication, without long-term current use of insulin ([facility]   [facility]   4 years ago Exposure to measles virus  [place], MD      Future Appointments            In 2 months Amalia Hailey, Nyoka Lint, MD [facility]

## 2021-11-07 ENCOUNTER — Ambulatory Visit: Payer: Medicare HMO | Admitting: Dermatology

## 2021-12-26 ENCOUNTER — Encounter: Payer: Medicare HMO | Admitting: Obstetrics and Gynecology

## 2021-12-26 DIAGNOSIS — Z01419 Encounter for gynecological examination (general) (routine) without abnormal findings: Secondary | ICD-10-CM

## 2021-12-26 DIAGNOSIS — Z124 Encounter for screening for malignant neoplasm of cervix: Secondary | ICD-10-CM

## 2021-12-26 DIAGNOSIS — Z1231 Encounter for screening mammogram for malignant neoplasm of breast: Secondary | ICD-10-CM

## 2022-03-18 ENCOUNTER — Telehealth: Payer: Self-pay | Admitting: Family Medicine

## 2022-03-20 NOTE — Telephone Encounter (Signed)
Requested medication (s) are due for refill today: routing for approval  Requested medication (s) are on the active medication list: yes  Last refill:  10/15/21  Future visit scheduled: no  Notes to clinic:  Unable to refill per protocol, courtesy refill already given, routing for provider approval.      Requested Prescriptions  Pending Prescriptions Disp Refills   losartan-hydrochlorothiazide (HYZAAR) 100-25 MG tablet [Pharmacy Med Name: LOSARTAN POTASSIUM/HYDROCHLOROTHIAZIDE 100-25 MG Tablet] 90 tablet 3    Sig: TAKE 1 TABLET EVERY DAY     Cardiovascular: ARB + Diuretic Combos Failed - 03/18/2022  5:53 PM      Failed - K in normal range and within 180 days    Potassium  Date Value Ref Range Status  05/14/2021 3.9 3.5 - 5.2 mmol/L Final  09/02/2011 4.0 3.5 - 5.1 mmol/L Final         Failed - Na in normal range and within 180 days    Sodium  Date Value Ref Range Status  05/14/2021 142 134 - 144 mmol/L Final  09/02/2011 141 136 - 145 mmol/L Final         Failed - Cr in normal range and within 180 days    Creatinine  Date Value Ref Range Status  09/02/2011 1.01 0.60 - 1.30 mg/dL Final   Creatinine, Ser  Date Value Ref Range Status  05/14/2021 0.79 0.57 - 1.00 mg/dL Final         Failed - eGFR is 10 or above and within 180 days    EGFR (African American)  Date Value Ref Range Status  09/02/2011 >60  Final   GFR calc Af Amer  Date Value Ref Range Status  07/12/2019 95 >59 mL/min/1.73 Final    Comment:    **[facility] currently reports eGFR in compliance with the current**   recommendations of the [employer]. [facility] will   update reporting as new guidelines are published from the NKF-ASN   Task force.    EGFR (Non-African Amer.)  Date Value Ref Range Status  09/02/2011 >60  Final    Comment:    eGFR values <58m/min/1.73 m2 may be an indication of chronic kidney disease (CKD). Calculated eGFR is useful in patients with stable renal function. The  eGFR calculation will not be reliable in acutely ill patients when serum creatinine is changing rapidly. It is not useful in  patients on dialysis. The eGFR calculation may not be applicable to patients at the low and high extremes of body sizes, pregnant women, and vegetarians.    GFR calc non Af Amer  Date Value Ref Range Status  07/12/2019 82 >59 mL/min/1.73 Final   eGFR  Date Value Ref Range Status  05/14/2021 83 >59 mL/min/1.73 Final         Failed - Valid encounter within last 6 months    Recent Outpatient Visits           10 months ago Urinary frequency   [facility]   1 year ago Type 2 diabetes mellitus without complication, without long-term current use of insulin ([facility]   [facility]   2 years ago Type 2 diabetes mellitus without complication, without long-term current use of insulin ([facility]   [facility]   2 years ago Type 2 diabetes mellitus without complication, without long-term current use of insulin ([facility]   [facility]   4  years ago Exposure to measles virus   [place], MD              Passed - Patient is not pregnant      Passed - Last BP in normal range    BP Readings from Last 1 Encounters:  06/12/21 138/82

## 2022-03-22 NOTE — Telephone Encounter (Signed)
LMTCB and schedule prior to next refill

## 2022-03-22 NOTE — Telephone Encounter (Signed)
Have sent 90 day prescription to her mail order. She needs to schedule office before we can approve additional refills

## 2022-05-03 ENCOUNTER — Other Ambulatory Visit: Payer: Self-pay | Admitting: Obstetrics and Gynecology

## 2022-05-03 ENCOUNTER — Other Ambulatory Visit: Payer: Self-pay | Admitting: Family Medicine

## 2022-05-03 DIAGNOSIS — B379 Candidiasis, unspecified: Secondary | ICD-10-CM

## 2022-05-03 DIAGNOSIS — I10 Essential (primary) hypertension: Secondary | ICD-10-CM

## 2022-05-06 NOTE — Telephone Encounter (Signed)
If symptomatic, needs appointment to be seen in office.

## 2022-05-08 ENCOUNTER — Other Ambulatory Visit: Payer: Self-pay | Admitting: Family Medicine

## 2022-06-24 ENCOUNTER — Other Ambulatory Visit: Payer: Self-pay | Admitting: Family Medicine

## 2022-06-24 DIAGNOSIS — F32A Depression, unspecified: Secondary | ICD-10-CM

## 2022-06-24 DIAGNOSIS — E119 Type 2 diabetes mellitus without complications: Secondary | ICD-10-CM

## 2022-06-24 DIAGNOSIS — F419 Anxiety disorder, unspecified: Secondary | ICD-10-CM

## 2022-06-24 NOTE — Telephone Encounter (Signed)
Not  seen in over a year, need o.v. scheduled before prescription can be approved.

## 2022-07-04 NOTE — Progress Notes (Signed)
[facility] Quality Team Note  Name: Misty [place] Date of Birth: May 15, 1955 MRN: [id number] Date: 07/04/2022  [place] Quality Team has reviewed this patient's chart, please see recommendations below:  [facility] Quality Other; (PATIENT HAS AWV SCHEDULED FOR 07/15/2022. PLEASE ADDRESS THE FOLLOWING GAPS- COL (COLON CANCER SCREENING), BCS(BREAST CANCER SCREENING), AND KED(KIDNEY EVALUATION IN DIABETICS, NEED MICROALBUMIN/CREATININE RATIO TEST AND EGFR COMPLETED))

## 2022-07-20 ENCOUNTER — Other Ambulatory Visit: Payer: Self-pay | Admitting: Family Medicine

## 2022-08-07 ENCOUNTER — Ambulatory Visit (INDEPENDENT_AMBULATORY_CARE_PROVIDER_SITE_OTHER): Payer: Medicare HMO

## 2022-08-07 VITALS — Ht 68.0 in | Wt 238.0 lb

## 2022-08-07 DIAGNOSIS — Z13820 Encounter for screening for osteoporosis: Secondary | ICD-10-CM

## 2022-08-07 DIAGNOSIS — Z0000 Encounter for general adult medical examination without abnormal findings: Secondary | ICD-10-CM

## 2022-08-07 DIAGNOSIS — Z1211 Encounter for screening for malignant neoplasm of colon: Secondary | ICD-10-CM

## 2022-08-07 DIAGNOSIS — Z1231 Encounter for screening mammogram for malignant neoplasm of breast: Secondary | ICD-10-CM

## 2022-08-07 NOTE — Patient Instructions (Addendum)
Misty Bishop , Thank you for taking time to come for your Medicare Wellness Visit. I appreciate your ongoing commitment to your health goals. Please review the following plan we discussed and let me know if I can assist you in the future.   These are the goals we discussed:  Goals      DIET - EAT MORE FRUITS AND VEGETABLES        This is a list of the screening recommended for you and due dates:  Health Maintenance  Topic Date Due   Complete foot exam   Never done   Yearly kidney health urinalysis for diabetes  Never done   Zoster (Shingles) Vaccine (1 of 2) Never done   Colon Cancer Screening  Never done   Mammogram  10/05/2016   DTaP/Tdap/Td vaccine (2 - Td or Tdap) 01/13/2017   Eye exam for diabetics  05/17/2017   Pneumonia Vaccine (2 of 2 - PCV) 02/07/2018   Screening for Lung Cancer  02/28/2018   DEXA scan (bone density measurement)  Never done   Hemoglobin A1C  11/11/2021   COVID-19 Vaccine (4 - 2023-24 season) 11/16/2021   Yearly kidney function blood test for diabetes  05/14/2022   Flu Shot  10/17/2022   Medicare Annual Wellness Visit  08/07/2023   Hepatitis C Screening: USPSTF Recommendation to screen - Ages 18-79 yo.  Completed   HPV Vaccine  Aged Out    Advanced directives: no  Conditions/risks identified: moderate falls risk  Next appointment: Follow up in one year for your annual wellness visit 08/13/2023 @ 11:15am telephone   Preventive Care 65 Years and Older, Female Preventive care refers to lifestyle choices and visits with your health care provider that can promote health and wellness. What does preventive care include? A yearly physical exam. This is also called an annual well check. Dental exams once or twice a year. Routine eye exams. Ask your health care provider how often you should have your eyes checked. Personal lifestyle choices, including: Daily care of your teeth and gums. Regular physical activity. Eating a healthy diet. Avoiding tobacco  and drug use. Limiting alcohol use. Practicing safe sex. Taking low-dose aspirin every day. Taking vitamin and mineral supplements as recommended by your health care provider. What happens during an annual well check? The services and screenings done by your health care provider during your annual well check will depend on your age, overall health, lifestyle risk factors, and family history of disease. Counseling  Your health care provider may ask you questions about your: Alcohol use. Tobacco use. Drug use. Emotional well-being. Home and relationship well-being. Sexual activity. Eating habits. History of falls. Memory and ability to understand (cognition). Work and work Astronomer. Reproductive health. Screening  You may have the following tests or measurements: Height, weight, and BMI. Blood pressure. Lipid and cholesterol levels. These may be checked every 5 years, or more frequently if you are over 77 years old. Skin check. Lung cancer screening. You may have this screening every year starting at age 29 if you have a 30-pack-year history of smoking and currently smoke or have quit within the past 15 years. Fecal occult blood test (FOBT) of the stool. You may have this test every year starting at age 56. Flexible sigmoidoscopy or colonoscopy. You may have a sigmoidoscopy every 5 years or a colonoscopy every 10 years starting at age 28. Hepatitis C blood test. Hepatitis B blood test. Sexually transmitted disease (STD) testing. Diabetes screening. This is done by checking  your blood sugar (glucose) after you have not eaten for a while (fasting). You may have this done every 1-3 years. Bone density scan. This is done to screen for osteoporosis. You may have this done starting at age 80. Mammogram. This may be done every 1-2 years. Talk to your health care provider about how often you should have regular mammograms. Talk with your health care provider about your test results,  treatment options, and if necessary, the need for more tests. Vaccines  Your health care provider may recommend certain vaccines, such as: Influenza vaccine. This is recommended every year. Tetanus, diphtheria, and acellular pertussis (Tdap, Td) vaccine. You may need a Td booster every 10 years. Zoster vaccine. You may need this after age 80. Pneumococcal 13-valent conjugate (PCV13) vaccine. One dose is recommended after age 64. Pneumococcal polysaccharide (PPSV23) vaccine. One dose is recommended after age 52. Talk to your health care provider about which screenings and vaccines you need and how often you need them. This information is not intended to replace advice given to you by your health care provider. Make sure you discuss any questions you have with your health care provider. Document Released: 03/31/2015 Document Revised: 11/22/2015 Document Reviewed: 01/03/2015 Elsevier Interactive Patient Education  2017 [employer].  Fall Prevention in the Home Falls can cause injuries. They can happen to people of all ages. There are many things you can do to make your home safe and to help prevent falls. What can I do on the outside of my home? Regularly fix the edges of walkways and driveways and fix any cracks. Remove anything that might make you trip as you walk through a door, such as a raised step or threshold. Trim any bushes or trees on the path to your home. Use bright outdoor lighting. Clear any walking paths of anything that might make someone trip, such as rocks or tools. Regularly check to see if handrails are loose or broken. Make sure that both sides of any steps have handrails. Any raised decks and porches should have guardrails on the edges. Have any leaves, snow, or ice cleared regularly. Use sand or salt on walking paths during winter. Clean up any spills in your garage right away. This includes oil or grease spills. What can I do in the bathroom? Use night  lights. Install grab bars by the toilet and in the tub and shower. Do not use towel bars as grab bars. Use non-skid mats or decals in the tub or shower. If you need to sit down in the shower, use a plastic, non-slip stool. Keep the floor dry. Clean up any water that spills on the floor as soon as it happens. Remove soap buildup in the tub or shower regularly. Attach bath mats securely with double-sided non-slip rug tape. Do not have throw rugs and other things on the floor that can make you trip. What can I do in the bedroom? Use night lights. Make sure that you have a light by your bed that is easy to reach. Do not use any sheets or blankets that are too big for your bed. They should not hang down onto the floor. Have a firm chair that has side arms. You can use this for support while you get dressed. Do not have throw rugs and other things on the floor that can make you trip. What can I do in the kitchen? Clean up any spills right away. Avoid walking on wet floors. Keep items that you use a lot  in easy-to-reach places. If you need to reach something above you, use a strong step stool that has a grab bar. Keep electrical cords out of the way. Do not use floor polish or wax that makes floors slippery. If you must use wax, use non-skid floor wax. Do not have throw rugs and other things on the floor that can make you trip. What can I do with my stairs? Do not leave any items on the stairs. Make sure that there are handrails on both sides of the stairs and use them. Fix handrails that are broken or loose. Make sure that handrails are as long as the stairways. Check any carpeting to make sure that it is firmly attached to the stairs. Fix any carpet that is loose or worn. Avoid having throw rugs at the top or bottom of the stairs. If you do have throw rugs, attach them to the floor with carpet tape. Make sure that you have a light switch at the top of the stairs and the bottom of the stairs. If  you do not have them, ask someone to add them for you. What else can I do to help prevent falls? Wear shoes that: Do not have high heels. Have rubber bottoms. Are comfortable and fit you well. Are closed at the toe. Do not wear sandals. If you use a stepladder: Make sure that it is fully opened. Do not climb a closed stepladder. Make sure that both sides of the stepladder are locked into place. Ask someone to hold it for you, if possible. Clearly mark and make sure that you can see: Any grab bars or handrails. First and last steps. Where the edge of each step is. Use tools that help you move around (mobility aids) if they are needed. These include: Canes. Walkers. Scooters. Crutches. Turn on the lights when you go into a dark area. Replace any light bulbs as soon as they burn out. Set up your furniture so you have a clear path. Avoid moving your furniture around. If any of your floors are uneven, fix them. If there are any pets around you, be aware of where they are. Review your medicines with your doctor. Some medicines can make you feel dizzy. This can increase your chance of falling. Ask your doctor what other things that you can do to help prevent falls. This information is not intended to replace advice given to you by your health care provider. Make sure you discuss any questions you have with your health care provider. Document Released: 12/29/2008 Document Revised: 08/10/2015 Document Reviewed: 04/08/2014 Elsevier Interactive Patient Education  2017 [employer].

## 2022-08-07 NOTE — Progress Notes (Signed)
I connected with  Misty Bishop on 08/07/22 by a audio enabled telemedicine application and verified that I am speaking with the correct person using two identifiers.  Patient Location: Home  Provider Location: Office/Clinic  I discussed the limitations of evaluation and management by telemedicine. The patient expressed understanding and agreed to proceed.  Subjective:   Misty Bishop is a 67 y.o. female who presents for [employer] (Subsequent) preventive examination.  Review of Systems    Cardiac Risk Factors include: advanced age (>10men, >56 women);diabetes mellitus;hypertension;dyslipidemia;obesity (BMI >30kg/m2);smoking/ tobacco exposure;sedentary lifestyle    Objective:    Today's Vitals   08/07/22 1155 08/07/22 1156  Weight: 238 lb (108 kg)   Height: 5\' 8"  (1.727 m)   PainSc:  5    Body mass index is 36.19 kg/m.     08/07/2022   12:10 PM 07/10/2021    8:25 AM 07/06/2017   12:12 PM 09/12/2015    4:25 PM  Advanced Directives  Does Patient Have a Medical Advance Directive? No No No No  Would patient like information on creating a medical advance directive?  No - Patient declined No - Patient declined     Current Medications (verified) Outpatient Encounter Medications as of 08/07/2022  Medication Sig   Accu-Chek FastClix Lancets MISC CHECK BLOOD SUGAR EVERY DAY   ACCU-CHEK SMARTVIEW test strip CHECK BLOOD SUGAR EVERY DAY   albuterol (VENTOLIN HFA) 108 (90 Base) MCG/ACT inhaler Inhale 2 puffs into the lungs every 6 (six) hours as needed.   amLODipine (NORVASC) 2.5 MG tablet TAKE 1 TABLET(2.5 MG) BY MOUTH DAILY   aspirin 81 MG EC tablet Take 81 mg by mouth daily.   atorvastatin (LIPITOR) 40 MG tablet TAKE 1 TABLET EVERY DAY   b complex vitamins capsule Take 1 capsule by mouth Once PRN.   clonazePAM (KLONOPIN) 1 MG tablet Take 1-2 tablets daily and 2 tablets at bedtime.   dexlansoprazole (DEXILANT) 60 MG capsule Take 1 capsule (60 mg total) daily by  mouth.   diclofenac (VOLTAREN) 75 MG EC tablet TAKE 1 TABLET(75 MG) BY MOUTH TWICE DAILY AS NEEDED   diclofenac Sodium (VOLTAREN) 1 % GEL APPLY 1 APPLICATION TOPICALLY 4 (FOUR) TIMES DAILY AS NEEDED.   escitalopram (LEXAPRO) 20 MG tablet Take 1 tablet (20 mg total) by mouth daily.   estradiol (ESTRACE) 0.5 MG tablet TAKE 1 TABLET EVERY DAY   fluconazole (DIFLUCAN) 150 MG tablet Take 1 tablet (150 mg total) by mouth every 3 (three) days. For three doses (Patient not taking: Reported on 07/10/2021)   fluticasone (FLONASE) 50 MCG/ACT nasal spray Place 2 sprays into both nostrils daily.   HYDROcodone-acetaminophen (NORCO/VICODIN) 5-325 MG tablet Take 1-2 tablets by mouth every 6 (six) hours as needed for moderate pain.   loratadine (CLARITIN) 10 MG tablet Take 1 tablet by mouth daily.   losartan-hydrochlorothiazide (HYZAAR) 100-25 MG tablet TAKE 1 TABLET EVERY DAY   medroxyPROGESTERone (PROVERA) 2.5 MG tablet TAKE 1 TABLET EVERY DAY (NEED MD APPOINTMENT)   meloxicam (MOBIC) 15 MG tablet Take 1 tablet (15 mg total) by mouth daily as needed.   metFORMIN (GLUCOPHAGE-XR) 500 MG 24 hr tablet Take 2 tablets (1,000 mg total) by mouth daily with breakfast.   montelukast (SINGULAIR) 10 MG tablet TAKE 1 TABLET EVERY DAY   nitroGLYCERIN (NITROSTAT) 0.4 MG SL tablet Place 1 tablet (0.4 mg total) under the tongue every 5 (five) minutes as needed for chest pain (Up to 3 tablets).   Omega 3-6-9 Fatty Acids (OMEGA  3-6-9 COMPLEX) CAPS Take 1 capsule by mouth daily.   Probiotic Product (PROBIOTIC PO) Take 3 tablets by mouth daily.   ranitidine (ZANTAC) 150 MG capsule Take 1 capsule (150 mg total) by mouth 2 (two) times daily.   sucralfate (CARAFATE) 1 g tablet TAKE 1 TABLET TWICE DAILY   traMADol-acetaminophen (ULTRACET) 37.5-325 MG tablet Take 1 tablet by mouth every 6 (six) hours as needed.   valACYclovir (VALTREX) 1000 MG tablet Take 1 tablet (1,000 mg total) by mouth daily.   vitamin C (ASCORBIC ACID) 250 MG  tablet Take 4 tablets by mouth daily.   No facility-administered encounter medications on file as of 08/07/2022.    Allergies (verified) Levofloxacin, Levaquin  [levofloxacin in d5w], Penicillins, and Sulfa antibiotics   History: Past Medical History:  Diagnosis Date   Bipolar disorder (HCC)    Chronic gastritis with bleeding    Depression    Diabetes mellitus without complication (HCC)    Headache(784.0)    Hypertension    Palpitations    Holter monitor in 2008 showed PVCs   Peptic ulcer disease    PTSD (post-traumatic stress disorder)    Past Surgical History:  Procedure Laterality Date   BREAST BIOPSY     Left, benign   CESAREAN SECTION     KNEE SURGERY     Left   SHOULDER SURGERY     Left   TONSILLECTOMY     Family History  Problem Relation Age of Onset   Heart attack Mother 51       MI   Cancer Father        skin with mets to prostate and brain   COPD Father    Heart disease Sister        Palpitations   Diabetes Sister    Obesity Sister    Cancer Maternal Aunt        Leukemia   Schizophrenia Sister    COPD Sister    Social History   Socioeconomic History   Marital status: Single    Spouse name: Not on file   Number of children: 1   Years of education: Not on file   Highest education level: Not on file  Occupational History   Occupation: Disabled     Comment: disabeled due to chronic pain in low back- approved in 08/2005  Tobacco Use   Smoking status: Every Day    Packs/day: 1.50    Years: 34.00    Additional pack years: 0.00    Total pack years: 51.00    Types: Cigarettes   Smokeless tobacco: Never  Vaping Use   Vaping Use: Never used  Substance and Sexual Activity   Alcohol use: Yes    Alcohol/week: 0.0 standard drinks of alcohol    Comment: occasional use   Drug use: Yes    Types: Marijuana   Sexual activity: Yes    Birth control/protection: None  Other Topics Concern   Not on file  Social History Narrative   Disabled (back pain)    Originally from [place]   Has a daughter   Does not get regular exercise   Social Determinants of Health   Financial Resource Strain: Low Risk  (08/07/2022)   Overall Financial Resource Strain (CARDIA)    Difficulty of Paying Living Expenses: Not hard at all  Food Insecurity: No Food Insecurity (08/07/2022)   Hunger Vital Sign    Worried About Running Out of Food in the Last Year: Never true  Ran Out of Food in the Last Year: Never true  Transportation Needs: No Transportation Needs (08/07/2022)   PRAPARE - Administrator, Civil Service (Medical): No    Lack of Transportation (Non-Medical): No  Physical Activity: Inactive (08/07/2022)   Exercise Vital Sign    Days of Exercise per Week: 0 days    Minutes of Exercise per Session: 0 min  Stress: No Stress Concern Present (08/07/2022)   [employer] of Occupational Health - Occupational Stress Questionnaire    Feeling of Stress : Only a little  Social Connections: Socially Isolated (08/07/2022)   Social Connection and Isolation Panel [NHANES]    Frequency of Communication with Friends and Family: Three times a week    Frequency of Social Gatherings with Friends and Family: Once a week    Attends Religious Services: Never    Database administrator or Organizations: No    Attends Engineer, structural: Never    Marital Status: Divorced    Tobacco Counseling Ready to quit: Not Answered Counseling given: Not Answered   Clinical Intake:  Pre-visit preparation completed: Yes  Pain : 0-10 Pain Score: 5  Pain Type: Chronic pain Pain Location: Generalized (back/ neck) Pain Orientation: Lower Pain Descriptors / Indicators: Aching Pain Onset: More than a month ago Pain Frequency: Constant Pain Relieving Factors: medication; chiropractor helped a little;  Pain Relieving Factors: medication; chiropractor helped a little;  BMI - recorded: 36.19 Nutritional Status: BMI > 30  Obese Nutritional Risks:  None Diabetes: Yes CBG done?: No Did pt. bring in CBG monitor from home?: No  How often do you need to have someone help you when you read instructions, pamphlets, or other written materials from your doctor or pharmacy?: 1 - Never  Diabetic?yes  Interpreter Needed?: No  Comments: lives with daughter and boyfriend Information entered by :: B.Zyrus Hetland,LPN   Activities of Daily Living    08/07/2022   12:13 PM  In your present state of health, do you have any difficulty performing the following activities:  Hearing? 0  Vision? 1  Difficulty concentrating or making decisions? 0  Walking or climbing stairs? 1  Dressing or bathing? 0  Doing errands, shopping? 1  Preparing Food and eating ? N  Using the Toilet? N  In the past six months, have you accidently leaked urine? Y  Do you have problems with loss of bowel control? N  Managing your Medications? N  Managing your Finances? N  Housekeeping or managing your Housekeeping? Y    Patient Care Team: Malva Limes, MD as PCP - General (Family Medicine)  Indicate any recent Medical Services you may have received from other than [facility] providers in the past year (date may be approximate).     Assessment:   This is a routine wellness examination for [place].  Hearing/Vision screen Hearing Screening - Comments:: Inadequate hearing sometimes but hears well mostly Vision Screening - Comments:: Adequate vision w/vision Does not know the name thinks [facility]  Dietary issues and exercise activities discussed: Current Exercise Habits: The patient does not participate in regular exercise at present, Exercise limited by: neurologic condition(s);orthopedic condition(s);psychological condition(s)   Goals Addressed             This Visit's Progress    DIET - EAT MORE FRUITS AND VEGETABLES   Not on track      Depression Screen    08/07/2022   12:06 PM 07/10/2021    8:21 AM 05/14/2021  10:35 AM 08/15/2020   11:39 AM  01/25/2020    2:39 PM 07/06/2019   10:35 AM 02/07/2017   10:37 AM  PHQ 2/9 Scores  PHQ - 2 Score 2 2 4 3 3 3 4   PHQ- 9 Score 7 8 17 14 14 14 16     Fall Risk    08/07/2022   12:03 PM 07/10/2021    8:26 AM 08/15/2020   11:41 AM 07/06/2019   10:35 AM 02/07/2017   10:37 AM  Fall Risk   Falls in the past year? 0 0 0 0 Yes  Number falls in past yr: 0 0 0 0 2 or more  Injury with Fall? 0 0 0 0 No  Risk for fall due to : No Fall Risks No Fall Risks     Follow up Falls prevention discussed;Education provided Falls evaluation completed Falls evaluation completed      FALL RISK PREVENTION PERTAINING TO THE HOME:  Any stairs in or around the home? Yes  If so, are there any without handrails? Yes  Home free of loose throw rugs in walkways, pet beds, electrical cords, etc? Yes  Adequate lighting in your home to reduce risk of falls? Yes   ASSISTIVE DEVICES UTILIZED TO PREVENT FALLS:  Life alert? No  Use of a cane, walker or w/c? Yes  Grab bars in the bathroom? Yes  Shower chair or bench in shower? Yes  Elevated toilet seat or a handicapped toilet? No   Cognitive Function:        08/07/2022   12:16 PM 02/07/2017   10:37 AM  6CIT Screen  What Year? 0 points 0 points  What month? 0 points 0 points  What time? 0 points 0 points  Count back from 20 0 points 0 points  Months in reverse 0 points 0 points  Repeat phrase 0 points 0 points  Total Score 0 points 0 points    Immunizations Immunization History  Administered Date(s) Administered   Hepatitis A, Adult 09/05/2017   Hepatitis B, ADULT 09/05/2017   Influenza,inj,Quad PF,6+ Mos 02/07/2017, 01/25/2020   PFIZER(Purple Top)SARS-COV-2 Vaccination 07/05/2019, 07/27/2019, 01/25/2020   Pneumococcal Polysaccharide-23 02/07/2017   Tdap 01/14/2007    TDAP status: Up to date  Flu Vaccine status: Declined, Education has been provided regarding the importance of this vaccine but patient still declined. Advised may receive this vaccine  at local pharmacy or Health Dept. Aware to provide a copy of the vaccination record if obtained from local pharmacy or Health Dept. Verbalized acceptance and understanding.  Pneumococcal vaccine status: Up to date  Covid-19 vaccine status: Completed vaccines  Qualifies for Shingles Vaccine? Yes   Zostavax completed No   Shingrix Completed?: No.    Education has been provided regarding the importance of this vaccine. Patient has been advised to call insurance company to determine out of pocket expense if they have not yet received this vaccine. Advised may also receive vaccine at local pharmacy or Health Dept. Verbalized acceptance and understanding.  Screening Tests Health Maintenance  Topic Date Due   FOOT EXAM  Never done   Diabetic kidney evaluation - Urine ACR  Never done   Zoster Vaccines- Shingrix (1 of 2) Never done   COLONOSCOPY (Pts 45-20yrs Insurance coverage will need to be confirmed)  Never done   MAMMOGRAM  10/05/2016   DTaP/Tdap/Td (2 - Td or Tdap) 01/13/2017   OPHTHALMOLOGY EXAM  05/17/2017   Pneumonia Vaccine 82+ Years old (2 of 2 - PCV) 02/07/2018  Lung Cancer Screening  02/28/2018   DEXA SCAN  Never done   HEMOGLOBIN A1C  11/11/2021   COVID-19 Vaccine (4 - 2023-24 season) 11/16/2021   Diabetic kidney evaluation - eGFR measurement  05/14/2022   INFLUENZA VACCINE  10/17/2022   Medicare Annual Wellness (AWV)  08/07/2023   Hepatitis C Screening  Completed   HPV VACCINES  Aged Out    Health Maintenance  Health Maintenance Due  Topic Date Due   FOOT EXAM  Never done   Diabetic kidney evaluation - Urine ACR  Never done   Zoster Vaccines- Shingrix (1 of 2) Never done   COLONOSCOPY (Pts 45-91yrs Insurance coverage will need to be confirmed)  Never done   MAMMOGRAM  10/05/2016   DTaP/Tdap/Td (2 - Td or Tdap) 01/13/2017   OPHTHALMOLOGY EXAM  05/17/2017   Pneumonia Vaccine 26+ Years old (2 of 2 - PCV) 02/07/2018   Lung Cancer Screening  02/28/2018   DEXA SCAN   Never done   HEMOGLOBIN A1C  11/11/2021   COVID-19 Vaccine (4 - 2023-24 season) 11/16/2021   Diabetic kidney evaluation - eGFR measurement  05/14/2022    Colorectal cancer screening: Type of screening: Cologuard. Completed no. Repeat every 3 years ORDERED  Mammogram status: Ordered yes. Pt provided with contact info and advised to call to schedule appt.   Bone Density status: Ordered yes. Pt provided with contact info and advised to call to schedule appt.  Lung Cancer Screening: (Low Dose CT Chest recommended if Age 73-80 years, 30 pack-year currently smoking OR have quit w/in 15years.) does qualify.   Lung Cancer Screening Referral: no does not need  Additional Screening:  Hepatitis C Screening: does not qualify; Completed yes  Vision Screening: Recommended annual ophthalmology exams for early detection of glaucoma and other disorders of the eye. Is the patient up to date with their annual eye exam?  No  Who is the provider or what is the name of the office in which the patient attends annual eye exams? [facility] If pt is not established with a provider, would they like to be referred to a provider to establish care? No .   Dental Screening: Recommended annual dental exams for proper oral hygiene  Community Resource Referral / Chronic Care Management: CRR required this visit?  No   CCM required this visit?  No      Plan:     I have personally reviewed and noted the following in the patient's chart:   Medical and social history Use of alcohol, tobacco or illicit drugs  Current medications and supplements including opioid prescriptions. Patient is currently taking opioid prescriptions. Information provided to patient regarding non-opioid alternatives. Patient advised to discuss non-opioid treatment plan with their provider. Functional ability and status Nutritional status Physical activity Advanced directives List of other physicians Hospitalizations, surgeries, and ER  visits in previous 12 months Vitals Screenings to include cognitive, depression, and falls Referrals and appointments  In addition, I have reviewed and discussed with patient certain preventive protocols, quality metrics, and best practice recommendations. A written personalized care plan for preventive services as well as general preventive health recommendations were provided to patient.     Sue Lush, LPN   1/61/0960   Nurse Notes: pt states she is doing alright today but manages chronic pain and is limited in activities due to such. Pt has no concerns other than medication refills. I encouraged t to reach out to her pharmacy to send the request to PCP.  *  MMG/Dexa Scan/cologard orders placed

## 2022-08-23 ENCOUNTER — Other Ambulatory Visit: Payer: Self-pay | Admitting: Family Medicine

## 2022-08-23 DIAGNOSIS — F419 Anxiety disorder, unspecified: Secondary | ICD-10-CM

## 2022-08-23 DIAGNOSIS — M5137 Other intervertebral disc degeneration, lumbosacral region: Secondary | ICD-10-CM

## 2022-08-23 DIAGNOSIS — F32A Depression, unspecified: Secondary | ICD-10-CM

## 2022-08-23 MED ORDER — ESCITALOPRAM OXALATE 20 MG PO TABS
20.0000 mg | ORAL_TABLET | Freq: Every day | ORAL | 0 refills | Status: DC
Start: 2022-08-23 — End: 2022-11-11

## 2022-08-23 NOTE — Telephone Encounter (Signed)
Medication Refill - Medication: HYDROcodone-acetaminophen (NORCO/VICODIN) 5-325 MG tablet , escitalopram (LEXAPRO) 20 MG tablet   Pt only wants to see PCP and was scheduled for first available 07/12. Pt asked if it was possible to get a courtesy refill to get her through until her appointment. Please advise.   Has the patient contacted their pharmacy? Yes.    (Agent: If yes, when and what did the pharmacy advise?)  Preferred Pharmacy (with phone number or street name):  [facility] - [place], [place] - [address]  [address] [place] [address]  Phone: [phone] Fax: [phone]  Hours: Not open 24 hours   Has the patient been seen for an appointment in the last year OR does the patient have an upcoming appointment? Yes.    Agent: Please be advised that RX refills may take up to 3 business days. We ask that you follow-up with your pharmacy.

## 2022-08-23 NOTE — Telephone Encounter (Signed)
Requested medication (s) are due for refill today: yes  Requested medication (s) are on the active medication list: yes  Last refill:  08/30/21  Future visit scheduled: yes  Notes to clinic:  Unable to refill per protocol, cannot delegate.      Requested Prescriptions  Pending Prescriptions Disp Refills   HYDROcodone-acetaminophen (NORCO/VICODIN) 5-325 MG tablet 120 tablet 0    Sig: Take 1-2 tablets by mouth every 6 (six) hours as needed for moderate pain.     Not Delegated - Analgesics:  Opioid Agonist Combinations Failed - 08/23/2022  2:03 PM      Failed - This refill cannot be delegated      Failed - Urine Drug Screen completed in last 360 days      Passed - Valid encounter within last 3 months    Recent Outpatient Visits           1 year ago Urinary frequency   [facility] [facility] Malva Limes, MD   2 years ago Type 2 diabetes mellitus without complication, without long-term current use of insulin (HCC)   [place] [facility] Malva Limes, MD   2 years ago Type 2 diabetes mellitus without complication, without long-term current use of insulin (HCC)   [place] [facility] Malva Limes, MD   3 years ago Type 2 diabetes mellitus without complication, without long-term current use of insulin (HCC)   [place] [facility] Malva Limes, MD   4 years ago Exposure to measles virus   [facility] Mustache, Demetrios Isaacs, MD       Future Appointments             In 1 month Fisher, Demetrios Isaacs, MD [facility]   In 1 month Fisher, Demetrios Isaacs, MD [facility]            Signed Prescriptions Disp Refills   escitalopram (LEXAPRO) 20 MG tablet 90 tablet 0    Sig: Take 1 tablet (20 mg total) by mouth daily.     Psychiatry:  Antidepressants - SSRI Passed - 08/23/2022  2:03 PM      Passed - Completed PHQ-2 or PHQ-9  in the last 360 days      Passed - Valid encounter within last 6 months    Recent Outpatient Visits           1 year ago Urinary frequency   [place] [facility] Malva Limes, MD   2 years ago Type 2 diabetes mellitus without complication, without long-term current use of insulin (HCC)   [place] [facility] Malva Limes, MD   2 years ago Type 2 diabetes mellitus without complication, without long-term current use of insulin (HCC)   [place] [facility] Malva Limes, MD   3 years ago Type 2 diabetes mellitus without complication, without long-term current use of insulin (HCC)   [place] [facility] Malva Limes, MD   4 years ago Exposure to measles virus   [facility] Fisher, Demetrios Isaacs, MD       Future Appointments             In 1 month Fisher, Demetrios Isaacs, MD [facility]   In 1 month Fisher, Demetrios Isaacs, MD [facility], [facility]

## 2022-08-23 NOTE — Telephone Encounter (Signed)
Requested Prescriptions  Pending Prescriptions Disp Refills   HYDROcodone-acetaminophen (NORCO/VICODIN) 5-325 MG tablet 120 tablet 0    Sig: Take 1-2 tablets by mouth every 6 (six) hours as needed for moderate pain.     Not Delegated - Analgesics:  Opioid Agonist Combinations Failed - 08/23/2022  2:03 PM      Failed - This refill cannot be delegated      Failed - Urine Drug Screen completed in last 360 days      Passed - Valid encounter within last 3 months    Recent Outpatient Visits           1 year ago Urinary frequency   [place] [facility] Malva Limes, MD   2 years ago Type 2 diabetes mellitus without complication, without long-term current use of insulin (HCC)   [place] Malva Limes, MD   2 years ago Type 2 diabetes mellitus without complication, without long-term current use of insulin (HCC)   [place] [facility] Malva Limes, MD   3 years ago Type 2 diabetes mellitus without complication, without long-term current use of insulin (HCC)   [place] [facility] Malva Limes, MD   4 years ago Exposure to measles virus   [facility] Sherrie Mustache, Demetrios Isaacs, MD       Future Appointments             In 1 month Fisher, Demetrios Isaacs, MD [facility]   In 1 month Fisher, Demetrios Isaacs, MD [facility]             escitalopram (LEXAPRO) 20 MG tablet 90 tablet 0    Sig: Take 1 tablet (20 mg total) by mouth daily.     Psychiatry:  Antidepressants - SSRI Passed - 08/23/2022  2:03 PM      Passed - Completed PHQ-2 or PHQ-9 in the last 360 days      Passed - Valid encounter within last 6 months    Recent Outpatient Visits           1 year ago Urinary frequency   [place] [facility] Malva Limes, MD   2 years ago Type 2 diabetes mellitus without complication, without long-term  current use of insulin (HCC)   [place] [facility] Malva Limes, MD   2 years ago Type 2 diabetes mellitus without complication, without long-term current use of insulin (HCC)   [place] [facility] Malva Limes, MD   3 years ago Type 2 diabetes mellitus without complication, without long-term current use of insulin (HCC)    [facility] Malva Limes, MD   4 years ago Exposure to measles virus   [facility] Fisher, Demetrios Isaacs, MD       Future Appointments             In 1 month Fisher, Demetrios Isaacs, MD [facility]   In 1 month Fisher, Demetrios Isaacs, MD [facility], [facility]

## 2022-08-24 MED ORDER — HYDROCODONE-ACETAMINOPHEN 5-325 MG PO TABS
1.0000 | ORAL_TABLET | Freq: Four times a day (QID) | ORAL | 0 refills | Status: DC | PRN
Start: 2022-08-24 — End: 2022-10-09

## 2022-09-06 ENCOUNTER — Other Ambulatory Visit: Payer: Self-pay | Admitting: Family Medicine

## 2022-09-06 DIAGNOSIS — N951 Menopausal and female climacteric states: Secondary | ICD-10-CM

## 2022-09-08 ENCOUNTER — Other Ambulatory Visit: Payer: Self-pay | Admitting: Family Medicine

## 2022-09-08 DIAGNOSIS — F32A Depression, unspecified: Secondary | ICD-10-CM

## 2022-09-08 DIAGNOSIS — F419 Anxiety disorder, unspecified: Secondary | ICD-10-CM

## 2022-09-12 ENCOUNTER — Other Ambulatory Visit: Payer: Self-pay | Admitting: Family Medicine

## 2022-09-12 DIAGNOSIS — I10 Essential (primary) hypertension: Secondary | ICD-10-CM

## 2022-09-13 NOTE — Telephone Encounter (Signed)
Requested Prescriptions  Pending Prescriptions Disp Refills   amLODipine (NORVASC) 2.5 MG tablet [Pharmacy Med Name: AMLODIPINE BESYLATE 2.5 MG Tablet] 90 tablet 0    Sig: TAKE 1 TABLET EVERY DAY     Cardiovascular: Calcium Channel Blockers 2 Passed - 09/12/2022  2:33 PM      Passed - Last BP in normal range    BP Readings from Last 1 Encounters:  06/12/21 138/82         Passed - Last Heart Rate in normal range    Pulse Readings from Last 1 Encounters:  06/12/21 62         Passed - Valid encounter within last 6 months    Recent Outpatient Visits           1 year ago Urinary frequency   [facility] Malva Limes, MD   2 years ago Type 2 diabetes mellitus without complication, without long-term current use of insulin (HCC)   [facility] Malva Limes, MD   2 years ago Type 2 diabetes mellitus without complication, without long-term current use of insulin (HCC)   [place] [facility] Malva Limes, MD   3 years ago Type 2 diabetes mellitus without complication, without long-term current use of insulin (HCC)   [place] [facility] Malva Limes, MD   5 years ago Exposure to measles virus   [facility] Sherrie Mustache, Demetrios Isaacs, MD       Future Appointments             In 3 days Fisher, Demetrios Isaacs, MD [facility]   In 3 weeks Sherrie Mustache, Demetrios Isaacs, MD [facility], [facility]             losartan-hydrochlorothiazide (HYZAAR) 100-25 MG tablet [Pharmacy Med Name: LOSARTAN POTASSIUM/HYDROCHLOROTHIAZIDE 100-25 MG Tablet] 90 tablet 0    Sig: TAKE 1 TABLET EVERY DAY. PATIENT NEEDS TO SCHEDULE OFFICE VISIT FOR FOLLOW UP.     Cardiovascular: ARB + Diuretic Combos Failed - 09/12/2022  2:33 PM      Failed - K in normal range and within 180 days    Potassium  Date Value Ref Range Status  05/14/2021 3.9 3.5 - 5.2 mmol/L  Final  09/02/2011 4.0 3.5 - 5.1 mmol/L Final         Failed - Na in normal range and within 180 days    Sodium  Date Value Ref Range Status  05/14/2021 142 134 - 144 mmol/L Final  09/02/2011 141 136 - 145 mmol/L Final         Failed - Cr in normal range and within 180 days    Creatinine  Date Value Ref Range Status  09/02/2011 1.01 0.60 - 1.30 mg/dL Final   Creatinine, Ser  Date Value Ref Range Status  05/14/2021 0.79 0.57 - 1.00 mg/dL Final         Failed - eGFR is 10 or above and within 180 days    EGFR (African American)  Date Value Ref Range Status  09/02/2011 >60  Final   GFR calc Af Amer  Date Value Ref Range Status  07/12/2019 95 >59 mL/min/1.73 Final    Comment:    **[facility] currently reports eGFR in compliance with the current**   recommendations of the [employer]. [facility] will   update reporting as new guidelines are published from the NKF-ASN   Task  force.    EGFR (Non-African Amer.)  Date Value Ref Range Status  09/02/2011 >60  Final    Comment:    eGFR values <7mL/min/1.73 m2 may be an indication of chronic kidney disease (CKD). Calculated eGFR is useful in patients with stable renal function. The eGFR calculation will not be reliable in acutely ill patients when serum creatinine is changing rapidly. It is not useful in  patients on dialysis. The eGFR calculation may not be applicable to patients at the low and high extremes of body sizes, pregnant women, and vegetarians.    GFR calc non Af Amer  Date Value Ref Range Status  07/12/2019 82 >59 mL/min/1.73 Final   eGFR  Date Value Ref Range Status  05/14/2021 83 >59 mL/min/1.73 Final         Passed - Patient is not pregnant      Passed - Last BP in normal range    BP Readings from Last 1 Encounters:  06/12/21 138/82         Passed - Valid encounter within last 6 months    Recent Outpatient Visits           1 year ago Urinary frequency   [place] [facility] Malva Limes, MD   2 years ago Type 2 diabetes mellitus without complication, without long-term current use of insulin (HCC)   [place] [facility] Malva Limes, MD   2 years ago Type 2 diabetes mellitus without complication, without long-term current use of insulin (HCC)   [place] [facility] Malva Limes, MD   3 years ago Type 2 diabetes mellitus without complication, without long-term current use of insulin (HCC)   [place] [facility] Malva Limes, MD   5 years ago Exposure to measles virus   [facility] Fisher, Demetrios Isaacs, MD       Future Appointments             In 3 days Fisher, Demetrios Isaacs, MD [facility]   In 3 weeks Sherrie Mustache, Demetrios Isaacs, MD [facility], [facility]

## 2022-09-16 ENCOUNTER — Encounter: Payer: Self-pay | Admitting: Family Medicine

## 2022-09-16 ENCOUNTER — Ambulatory Visit (INDEPENDENT_AMBULATORY_CARE_PROVIDER_SITE_OTHER): Payer: Medicare HMO | Admitting: Family Medicine

## 2022-09-16 VITALS — BP 117/75 | HR 64 | Ht 67.0 in | Wt 226.0 lb

## 2022-09-16 DIAGNOSIS — E119 Type 2 diabetes mellitus without complications: Secondary | ICD-10-CM | POA: Diagnosis not present

## 2022-09-16 DIAGNOSIS — K219 Gastro-esophageal reflux disease without esophagitis: Secondary | ICD-10-CM | POA: Diagnosis not present

## 2022-09-16 DIAGNOSIS — F17210 Nicotine dependence, cigarettes, uncomplicated: Secondary | ICD-10-CM | POA: Diagnosis not present

## 2022-09-16 DIAGNOSIS — B379 Candidiasis, unspecified: Secondary | ICD-10-CM | POA: Diagnosis not present

## 2022-09-16 DIAGNOSIS — R42 Dizziness and giddiness: Secondary | ICD-10-CM | POA: Diagnosis not present

## 2022-09-16 DIAGNOSIS — I10 Essential (primary) hypertension: Secondary | ICD-10-CM

## 2022-09-16 DIAGNOSIS — I700 Atherosclerosis of aorta: Secondary | ICD-10-CM | POA: Diagnosis not present

## 2022-09-16 DIAGNOSIS — F339 Major depressive disorder, recurrent, unspecified: Secondary | ICD-10-CM

## 2022-09-16 DIAGNOSIS — E785 Hyperlipidemia, unspecified: Secondary | ICD-10-CM

## 2022-09-16 MED ORDER — ACCU-CHEK SOFTCLIX LANCETS MISC: 4 refills | Status: DC

## 2022-09-16 MED ORDER — ACCU-CHEK AVIVA PLUS W/DEVICE KIT
PACK | 0 refills | Status: DC
Start: 2022-09-16 — End: 2023-09-07

## 2022-09-16 MED ORDER — DEXLANSOPRAZOLE 60 MG PO CPDR: 60.0000 mg | DELAYED_RELEASE_CAPSULE | Freq: Every day | ORAL | 4 refills | Status: DC

## 2022-09-16 MED ORDER — ACCU-CHEK FASTCLIX LANCETS MISC: 3 refills | Status: AC

## 2022-09-16 MED ORDER — SUCRALFATE 1 G PO TABS
1.0000 g | ORAL_TABLET | Freq: Two times a day (BID) | ORAL | 4 refills | Status: AC
Start: 2022-09-16 — End: ?

## 2022-09-16 MED ORDER — FLUCONAZOLE 150 MG PO TABS: 150.0000 mg | ORAL_TABLET | ORAL | 3 refills | Status: DC

## 2022-09-16 MED ORDER — MONTELUKAST SODIUM 10 MG PO TABS: 10.0000 mg | ORAL_TABLET | Freq: Every day | ORAL | 4 refills | Status: DC

## 2022-09-16 MED ORDER — ACCU-CHEK AVIVA PLUS VI STRP
ORAL_STRIP | 4 refills | Status: DC
Start: 2022-09-16 — End: 2024-01-20

## 2022-09-16 NOTE — Patient Instructions (Signed)
Please review the attached list of medications and notify my office if there are any errors.  ? ?Please call the [facility] ([phone]) to schedule a routine screening mammogram. ? ?

## 2022-09-17 ENCOUNTER — Other Ambulatory Visit: Payer: Self-pay | Admitting: Emergency Medicine

## 2022-09-17 DIAGNOSIS — Z122 Encounter for screening for malignant neoplasm of respiratory organs: Secondary | ICD-10-CM

## 2022-09-17 DIAGNOSIS — F17210 Nicotine dependence, cigarettes, uncomplicated: Secondary | ICD-10-CM

## 2022-09-17 DIAGNOSIS — Z87891 Personal history of nicotine dependence: Secondary | ICD-10-CM

## 2022-09-17 LAB — COMPREHENSIVE METABOLIC PANEL
ALT: 17 IU/L (ref 0–32)
AST: 16 IU/L (ref 0–40)
Albumin: 4.2 g/dL (ref 3.9–4.9)
Alkaline Phosphatase: 59 IU/L (ref 44–121)
BUN/Creatinine Ratio: 19 (ref 12–28)
BUN: 16 mg/dL (ref 8–27)
Bilirubin Total: 0.3 mg/dL (ref 0.0–1.2)
CO2: 26 mmol/L (ref 20–29)
Calcium: 9 mg/dL (ref 8.7–10.3)
Chloride: 100 mmol/L (ref 96–106)
Creatinine, Ser: 0.86 mg/dL (ref 0.57–1.00)
Globulin, Total: 2.6 g/dL (ref 1.5–4.5)
Glucose: 202 mg/dL — ABNORMAL HIGH (ref 70–99)
Potassium: 3.8 mmol/L (ref 3.5–5.2)
Sodium: 139 mmol/L (ref 134–144)
Total Protein: 6.8 g/dL (ref 6.0–8.5)
eGFR: 74 mL/min/{1.73_m2} (ref >59)

## 2022-09-17 LAB — CBC
Hematocrit: 41.6 % (ref 34.0–46.6)
Hemoglobin: 14.4 g/dL (ref 11.1–15.9)
MCH: 32 pg (ref 26.6–33.0)
MCHC: 34.6 g/dL (ref 31.5–35.7)
MCV: 92 fL (ref 79–97)
Platelets: 213 10*3/uL (ref 150–450)
RBC: 4.5 x10E6/uL (ref 3.77–5.28)
RDW: 11.8 % (ref 11.7–15.4)
WBC: 8.3 10*3/uL (ref 3.4–10.8)

## 2022-09-17 LAB — LIPID PANEL
Chol/HDL Ratio: 5.5 ratio — ABNORMAL HIGH (ref 0.0–4.4)
Cholesterol, Total: 199 mg/dL (ref 100–199)
HDL: 36 mg/dL — ABNORMAL LOW (ref >39)
LDL Chol Calc (NIH): 115 mg/dL — ABNORMAL HIGH (ref 0–99)
Triglycerides: 274 mg/dL — ABNORMAL HIGH (ref 0–149)
VLDL Cholesterol Cal: 48 mg/dL — ABNORMAL HIGH (ref 5–40)

## 2022-09-17 LAB — HEMOGLOBIN A1C
Est. average glucose Bld gHb Est-mCnc: 126 mg/dL
Hgb A1c MFr Bld: 6 % — ABNORMAL HIGH (ref 4.8–5.6)

## 2022-09-17 LAB — TSH: TSH: 2.46 u[IU]/mL (ref 0.450–4.500)

## 2022-09-24 NOTE — Progress Notes (Signed)
Established patient visit   Patient: Misty Bishop   DOB: [birth year]   67 y.o. Female  MRN: [id number] Visit Date: 09/16/2022  Today's healthcare provider: Mila Merry, MD   Chief Complaint  Patient presents with   Hyperlipidemia   Hypertension   Diabetes   Subjective    Discussed the use of AI scribe software for clinical note transcription with the patient, who gave verbal consent to proceed.  History of Present Illness   The patient, with a history of frequent antibiotic use and hypercholesterolemia, presents with recent episodes of dizziness and imbalance. She was unsure of the cause, considering it could be related to her hearing or possibly allergies. She noted an improvement in symptoms after resuming loratadine, which she had previously stopped. The patient has not been taking her prescribed atorvastatin for over a year due to frequent antibiotic use and the perceived need to stop the statin during these courses.  The patient also reports a change in vision over the past year, but admits to not consistently wearing her glasses. She has a history of astigmatism and has been told she does not have glaucoma.  In addition, the patient has been experiencing skin changes on her nose, which she believes may be related to a skin discoloration condition. She has not seen a dermatologist in over a year and plans to schedule an appointment.  The patient also mentions a history of smoking and has not had a lung scan in several years. She has a Cologuard kit at home that she plans to use soon.  Lastly, the patient has a history of diabetes and sees a podiatrist regularly. She has not had any recent foot issues. She also reports a history of frequent yeast infections, for which she takes fluconazole weekly for three weeks at a time.       Medications: Outpatient Medications Prior to Visit  Medication Sig   albuterol (VENTOLIN HFA) 108 (90 Base) MCG/ACT inhaler Inhale 2  puffs into the lungs every 6 (six) hours as needed.   amLODipine (NORVASC) 2.5 MG tablet TAKE 1 TABLET EVERY DAY   aspirin 81 MG EC tablet Take 81 mg by mouth daily.   b complex vitamins capsule Take 1 capsule by mouth Once PRN.   clonazePAM (KLONOPIN) 1 MG tablet Take 1-2 tablets daily and 2 tablets at bedtime.   diclofenac (VOLTAREN) 75 MG EC tablet TAKE 1 TABLET(75 MG) BY MOUTH TWICE DAILY AS NEEDED   diclofenac Sodium (VOLTAREN) 1 % GEL APPLY 1 APPLICATION TOPICALLY 4 (FOUR) TIMES DAILY AS NEEDED.   escitalopram (LEXAPRO) 20 MG tablet Take 1 tablet (20 mg total) by mouth daily.   estradiol (ESTRACE) 0.5 MG tablet TAKE 1 TABLET EVERY DAY   fluticasone (FLONASE) 50 MCG/ACT nasal spray Place 2 sprays into both nostrils daily.   HYDROcodone-acetaminophen (NORCO/VICODIN) 5-325 MG tablet Take 1-2 tablets by mouth every 6 (six) hours as needed for moderate pain.   loratadine (CLARITIN) 10 MG tablet Take 1 tablet by mouth daily.   losartan-hydrochlorothiazide (HYZAAR) 100-25 MG tablet TAKE 1 TABLET EVERY DAY. PATIENT NEEDS TO SCHEDULE OFFICE VISIT FOR FOLLOW UP.   medroxyPROGESTERone (PROVERA) 2.5 MG tablet TAKE 1 TABLET EVERY DAY (NEED MD APPOINTMENT)   meloxicam (MOBIC) 15 MG tablet Take 1 tablet (15 mg total) by mouth daily as needed.   metFORMIN (GLUCOPHAGE-XR) 500 MG 24 hr tablet Take 2 tablets (1,000 mg total) by mouth daily with breakfast.   nitroGLYCERIN (NITROSTAT) 0.4 MG  SL tablet Place 1 tablet (0.4 mg total) under the tongue every 5 (five) minutes as needed for chest pain (Up to 3 tablets).   Omega 3-6-9 Fatty Acids (OMEGA 3-6-9 COMPLEX) CAPS Take 1 capsule by mouth daily.   Probiotic Product (PROBIOTIC PO) Take 3 tablets by mouth daily.   ranitidine (ZANTAC) 150 MG capsule Take 1 capsule (150 mg total) by mouth 2 (two) times daily.   traMADol-acetaminophen (ULTRACET) 37.5-325 MG tablet Take 1 tablet by mouth every 6 (six) hours as needed.   valACYclovir (VALTREX) 1000 MG tablet Take 1  tablet (1,000 mg total) by mouth daily.   vitamin C (ASCORBIC ACID) 250 MG tablet Take 4 tablets by mouth daily.   [DISCONTINUED] Accu-Chek FastClix Lancets MISC CHECK BLOOD SUGAR EVERY DAY   [DISCONTINUED] ACCU-CHEK SMARTVIEW test strip CHECK BLOOD SUGAR EVERY DAY   [DISCONTINUED] dexlansoprazole (DEXILANT) 60 MG capsule Take 1 capsule (60 mg total) daily by mouth.   [DISCONTINUED] fluconazole (DIFLUCAN) 150 MG tablet Take 1 tablet (150 mg total) by mouth every 3 (three) days. For three doses   [DISCONTINUED] montelukast (SINGULAIR) 10 MG tablet TAKE 1 TABLET EVERY DAY   [DISCONTINUED] sucralfate (CARAFATE) 1 g tablet TAKE 1 TABLET TWICE DAILY   atorvastatin (LIPITOR) 40 MG tablet TAKE 1 TABLET EVERY DAY (Patient not taking: Reported on 09/16/2022)   No facility-administered medications prior to visit.   Review of Systems  Constitutional:  Negative for appetite change, chills, fatigue and fever.  Respiratory:  Negative for chest tightness and shortness of breath.   Cardiovascular:  Negative for chest pain and palpitations.  Gastrointestinal:  Negative for abdominal pain, nausea and vomiting.  Neurological:  Negative for dizziness and weakness.       Objective    BP 117/75 (BP Location: Right Arm, Patient Position: Sitting, Cuff Size: Large)   Pulse 64   Ht 5\' 7"  (1.702 m)   Wt 226 lb (102.5 kg)   SpO2 98%   BMI 35.40 kg/m    Physical Exam   General: Appearance:    Mildly obese female in no acute distress  Eyes:    PERRL, conjunctiva/corneas clear, EOM's intact       Lungs:     Clear to auscultation bilaterally, respirations unlabored  Heart:    Normal heart rate. Normal rhythm. No murmurs, rubs, or gallops.    MS:   All extremities are intact.    Neurologic:   Awake, alert, oriented x 3. No apparent focal neurological defect.         Assessment & Plan     Assessment and Plan    Dizziness: Recent episodes of dizziness, possibly related to allergies. Improvement noted with  loratadine. -Continue loratadine as needed for allergy symptoms.  Hyperlipidemia: Patient has not been taking atorvastatin for over a year due to frequent antibiotic use and perceived interaction. Discussed that most antibiotics, including amoxicillin and azithromycin, do not interact with statins. Fluconazole can interact, but patient's intermittent use should not pose a significant risk. -Consider resuming atorvastatin pending lipid panel results.  General Health Maintenance: -Order routine blood work today, including A1C and lipid panel. Fasting not required. -Schedule mammogram, patient to call and arrange. -Order low-dose CT scan for lung cancer screening -Recommend colon cancer screening with Cologuard and she reports she has kit at home that was sent a few months ago.. -Advise patient to schedule follow-up with dermatologist for skin concerns. -Advise patient to schedule eye exam, if not done within the last year.  Mila Merry, MD  [facility] [phone] (phone) [phone] (fax)  [facility]

## 2022-09-25 ENCOUNTER — Ambulatory Visit
Admission: RE | Admit: 2022-09-25 | Discharge: 2022-09-25 | Disposition: A | Discharge status: Home / Self Care | Payer: Medicare HMO | Source: Ambulatory Visit | Attending: Acute Care | Admitting: Acute Care

## 2022-09-25 DIAGNOSIS — Z122 Encounter for screening for malignant neoplasm of respiratory organs: Secondary | ICD-10-CM

## 2022-09-25 DIAGNOSIS — Z87891 Personal history of nicotine dependence: Secondary | ICD-10-CM | POA: Diagnosis not present

## 2022-09-25 DIAGNOSIS — F17210 Nicotine dependence, cigarettes, uncomplicated: Secondary | ICD-10-CM | POA: Diagnosis not present

## 2022-09-26 DIAGNOSIS — K219 Gastro-esophageal reflux disease without esophagitis: Secondary | ICD-10-CM | POA: Diagnosis not present

## 2022-09-26 DIAGNOSIS — Z6836 Body mass index (BMI) 36.0-36.9, adult: Secondary | ICD-10-CM | POA: Diagnosis not present

## 2022-09-26 DIAGNOSIS — I2089 Other forms of angina pectoris: Secondary | ICD-10-CM | POA: Diagnosis not present

## 2022-09-26 DIAGNOSIS — E669 Obesity, unspecified: Secondary | ICD-10-CM | POA: Diagnosis not present

## 2022-09-26 DIAGNOSIS — E785 Hyperlipidemia, unspecified: Secondary | ICD-10-CM | POA: Diagnosis not present

## 2022-09-26 DIAGNOSIS — R079 Chest pain, unspecified: Secondary | ICD-10-CM | POA: Diagnosis not present

## 2022-09-26 DIAGNOSIS — R9431 Abnormal electrocardiogram [ECG] [EKG]: Secondary | ICD-10-CM | POA: Diagnosis not present

## 2022-09-26 DIAGNOSIS — Z720 Tobacco use: Secondary | ICD-10-CM | POA: Diagnosis not present

## 2022-09-27 ENCOUNTER — Ambulatory Visit: Payer: Medicare HMO | Admitting: Family Medicine

## 2022-09-30 ENCOUNTER — Other Ambulatory Visit: Payer: Self-pay

## 2022-09-30 ENCOUNTER — Other Ambulatory Visit: Payer: Self-pay | Admitting: Internal Medicine

## 2022-09-30 DIAGNOSIS — R079 Chest pain, unspecified: Secondary | ICD-10-CM

## 2022-09-30 DIAGNOSIS — R9431 Abnormal electrocardiogram [ECG] [EKG]: Secondary | ICD-10-CM

## 2022-09-30 DIAGNOSIS — Z1211 Encounter for screening for malignant neoplasm of colon: Secondary | ICD-10-CM | POA: Diagnosis not present

## 2022-09-30 DIAGNOSIS — Z122 Encounter for screening for malignant neoplasm of respiratory organs: Secondary | ICD-10-CM

## 2022-09-30 DIAGNOSIS — F17210 Nicotine dependence, cigarettes, uncomplicated: Secondary | ICD-10-CM

## 2022-09-30 DIAGNOSIS — I2089 Other forms of angina pectoris: Secondary | ICD-10-CM

## 2022-09-30 DIAGNOSIS — Z87891 Personal history of nicotine dependence: Secondary | ICD-10-CM

## 2022-10-01 ENCOUNTER — Telehealth: Payer: Self-pay

## 2022-10-01 ENCOUNTER — Encounter: Payer: Medicare HMO | Admitting: Family Medicine

## 2022-10-01 ENCOUNTER — Other Ambulatory Visit: Payer: Self-pay | Admitting: Family Medicine

## 2022-10-01 DIAGNOSIS — E785 Hyperlipidemia, unspecified: Secondary | ICD-10-CM

## 2022-10-01 NOTE — Telephone Encounter (Signed)
Copied from CRM [id number]. Topic: Medical Record Request - Patient ROI Request >> Oct 01, 2022  1:06 PM Everette C wrote: Reason for CRM: The patient has called to request that copies of their lung screening be submitted to their cardiologists office  The patient would like for imaging and notes to be submitted to their cardiologists office Dr. Alwyn Pea MD

## 2022-10-01 NOTE — Telephone Encounter (Unsigned)
Copied from CRM [id number]. Topic: General - Other >> Oct 01, 2022  1:08 PM Everette C wrote: Reason for CRM: Medication Refill - Medication: atorvastatin (LIPITOR) 40 MG tablet [[id number]]  fluticasone (FLONASE) 50 MCG/ACT nasal spray [[id number]]  Has the patient contacted their pharmacy? Yes.   (Agent: If no, request that the patient contact the pharmacy for the refill. If patient does not wish to contact the pharmacy document the reason why and proceed with request.) (Agent: If yes, when and what did the pharmacy advise?)  Preferred Pharmacy (with phone number or street name): [facility] - [place], [place] - [address] [place] [address] Phone: [phone] Fax: [phone] Hours: Not open 24 hours   Has the patient been seen for an appointment in the last year OR does the patient have an upcoming appointment? Yes.    Agent: Please be advised that RX refills may take up to 3 business days. We ask that you follow-up with your pharmacy.

## 2022-10-02 MED ORDER — ROSUVASTATIN CALCIUM 10 MG PO TABS: 10.0000 mg | ORAL_TABLET | Freq: Every day | ORAL | 0 refills | Status: DC

## 2022-10-02 MED ORDER — FLUTICASONE PROPIONATE 50 MCG/ACT NA SUSP: 2.0000 | Freq: Every day | NASAL | 4 refills | Status: DC

## 2022-10-02 NOTE — Telephone Encounter (Signed)
Requested medication (s) are due for refill today: routing for review  Requested medication (s) are on the active medication list: yes  Last refill:  multiple dates  Future visit scheduled: no  Notes to clinic:  Unable to refill per protocol, Rx requests have not been refilled in several years, should patient continue to take. Routing for approval.     Requested Prescriptions  Pending Prescriptions Disp Refills   atorvastatin (LIPITOR) 40 MG tablet 90 tablet 4    Sig: Take 1 tablet (40 mg total) by mouth daily.     Cardiovascular:  Antilipid - Statins Failed - 10/01/2022  2:01 PM      Failed - Lipid Panel in normal range within the last 12 months    Cholesterol, Total  Date Value Ref Range Status  09/16/2022 199 100 - 199 mg/dL Final   Cholesterol  Date Value Ref Range Status  09/02/2011 183 0 - 200 mg/dL Final   Ldl Cholesterol, Calc  Date Value Ref Range Status  09/02/2011 85 0 - 100 mg/dL Final   LDL Chol Calc (NIH)  Date Value Ref Range Status  09/16/2022 115 (H) 0 - 99 mg/dL Final   HDL Cholesterol  Date Value Ref Range Status  09/02/2011 55 40 - 60 mg/dL Final   HDL  Date Value Ref Range Status  09/16/2022 36 (L) >39 mg/dL Final   Triglycerides  Date Value Ref Range Status  09/16/2022 274 (H) 0 - 149 mg/dL Final  86/57/8469 629 (H) 0 - 200 mg/dL Final         Passed - Patient is not pregnant      Passed - Valid encounter within last 12 months    Recent Outpatient Visits           2 weeks ago Essential hypertension   [facility] Malva Limes, MD   1 year ago Urinary frequency   [place] [facility] Malva Limes, MD   2 years ago Type 2 diabetes mellitus without complication, without long-term current use of insulin (HCC)   [place] [facility] Malva Limes, MD   2 years ago Type 2 diabetes mellitus without complication, without long-term current use of insulin (HCC)   [facility] Malva Limes, MD   3 years ago Type 2 diabetes mellitus without complication, without long-term current use of insulin (HCC)   [place] [facility] Malva Limes, MD               fluticasone (FLONASE) 50 MCG/ACT nasal spray 48 g 4    Sig: Place 2 sprays into both nostrils daily.     Ear, Nose, and Throat: Nasal Preparations - Corticosteroids Passed - 10/01/2022  2:01 PM      Passed - Valid encounter within last 12 months    Recent Outpatient Visits           2 weeks ago Essential hypertension   [facility] Malva Limes, MD   1 year ago Urinary frequency   [place] [facility] Malva Limes, MD   2 years ago Type 2 diabetes mellitus without complication, without long-term current use of insulin [facility])   [place] [facility] Malva Limes, MD   2 years ago Type 2 diabetes mellitus without complication, without long-term current use of insulin [facility])   [facility] Malva Limes,  MD   3 years ago Type 2 diabetes mellitus without complication, without long-term current use of insulin (HCC)   [place] [facility] Sherrie Mustache, Demetrios Isaacs, MD

## 2022-10-02 NOTE — Telephone Encounter (Signed)
Misty Limes, MD 09/17/2022  2:41 PM EDT  Triglycerides are much too high at 274, need to start rosuvastatin 10mg  once a day. Please send in prescription for #30, rf x 3. A1c is good at 6.0%. rest of labs normal. Please Continue current medications.  Schedule follow up office visit to check on lipids in 3 months.  Patient advised of lab results. She will call back to schedule 3 month fu.

## 2022-10-03 ENCOUNTER — Telehealth: Payer: Self-pay

## 2022-10-03 DIAGNOSIS — R9431 Abnormal electrocardiogram [ECG] [EKG]: Secondary | ICD-10-CM | POA: Diagnosis not present

## 2022-10-03 DIAGNOSIS — Z1231 Encounter for screening mammogram for malignant neoplasm of breast: Secondary | ICD-10-CM

## 2022-10-03 NOTE — Telephone Encounter (Signed)
Copied from CRM [id number]. Topic: General - Inquiry >> Oct 03, 2022  9:49 AM Misty Bishop wrote: Reason for CRM: patient would like a back to let her know when and where she needs to go to get a mammagram

## 2022-10-08 ENCOUNTER — Telehealth (HOSPITAL_COMMUNITY): Payer: Self-pay | Admitting: Emergency Medicine

## 2022-10-08 NOTE — Telephone Encounter (Signed)
Reaching out to patient to offer assistance regarding upcoming cardiac imaging study; pt verbalizes understanding of appt date/time, parking situation and where to check in, pre-test NPO status and medications ordered, and verified current allergies; name and call back number provided for further questions should they arise Sara Wallace RN Navigator Cardiac Imaging [facility] [phone] office [phone] cell 

## 2022-10-09 ENCOUNTER — Ambulatory Visit
Admission: RE | Admit: 2022-10-09 | Discharge: 2022-10-09 | Disposition: A | Discharge status: Home / Self Care | Payer: Medicare HMO | Source: Ambulatory Visit | Attending: Internal Medicine | Admitting: Internal Medicine

## 2022-10-09 ENCOUNTER — Encounter: Payer: Medicare HMO | Admitting: Family Medicine

## 2022-10-09 ENCOUNTER — Other Ambulatory Visit: Payer: Self-pay | Admitting: Family Medicine

## 2022-10-09 DIAGNOSIS — I2089 Other forms of angina pectoris: Secondary | ICD-10-CM | POA: Insufficient documentation

## 2022-10-09 DIAGNOSIS — R9431 Abnormal electrocardiogram [ECG] [EKG]: Secondary | ICD-10-CM | POA: Diagnosis not present

## 2022-10-09 DIAGNOSIS — R079 Chest pain, unspecified: Secondary | ICD-10-CM | POA: Insufficient documentation

## 2022-10-09 DIAGNOSIS — M5137 Other intervertebral disc degeneration, lumbosacral region: Secondary | ICD-10-CM

## 2022-10-09 MED ORDER — IOHEXOL 350 MG/ML SOLN
80.0000 mL | Freq: Once | INTRAVENOUS | Status: AC | PRN
  Administered 2022-10-09: 80 mL via INTRAVENOUS

## 2022-10-09 MED ORDER — NITROGLYCERIN 0.4 MG SL SUBL
0.8000 mg | SUBLINGUAL_TABLET | Freq: Once | SUBLINGUAL | Status: AC
  Administered 2022-10-09: 0.8 mg via SUBLINGUAL

## 2022-10-09 NOTE — Progress Notes (Signed)

## 2022-10-09 NOTE — Telephone Encounter (Signed)
Medication Refill - Medication: HYDROcodone-acetaminophen (NORCO/VICODIN) 5-325 MG tablet   Has the patient contacted their pharmacy? No.   Preferred Pharmacy (with phone number or street name):  [facility] - [place], [place] - [address] Phone: [phone]  Fax: [phone]      Has the patient been seen for an appointment in the last year OR does the patient have an upcoming appointment? Yes.    Please assist patient further

## 2022-10-10 LAB — COLOGUARD: COLOGUARD: NEGATIVE

## 2022-10-10 NOTE — Telephone Encounter (Signed)
Requested medication (s) are due for refill today - yes  Requested medication (s) are on the active medication list -yes  Future visit scheduled -no  Last refill: 08/24/22 #120   Notes to clinic: non delegated Rx  Requested Prescriptions  Pending Prescriptions Disp Refills   HYDROcodone-acetaminophen (NORCO/VICODIN) 5-325 MG tablet 120 tablet 0    Sig: Take 1-2 tablets by mouth every 6 (six) hours as needed for moderate pain.     Not Delegated - Analgesics:  Opioid Agonist Combinations Failed - 10/09/2022  2:04 PM      Failed - This refill cannot be delegated      Failed - Urine Drug Screen completed in last 360 days      Passed - Valid encounter within last 3 months    Recent Outpatient Visits           3 weeks ago Essential hypertension   [place] [facility] Malva Limes, MD   1 year ago Urinary frequency   [place] [facility] Malva Limes, MD   2 years ago Type 2 diabetes mellitus without complication, without long-term current use of insulin (HCC)   [place] [facility] Malva Limes, MD   2 years ago Type 2 diabetes mellitus without complication, without long-term current use of insulin (HCC)   [place] [facility] Malva Limes, MD   3 years ago Type 2 diabetes mellitus without complication, without long-term current use of insulin (HCC)   [place] [facility] Malva Limes, MD                 Requested Prescriptions  Pending Prescriptions Disp Refills   HYDROcodone-acetaminophen (NORCO/VICODIN) 5-325 MG tablet 120 tablet 0    Sig: Take 1-2 tablets by mouth every 6 (six) hours as needed for moderate pain.     Not Delegated - Analgesics:  Opioid Agonist Combinations Failed - 10/09/2022  2:04 PM      Failed - This refill cannot be delegated      Failed - Urine Drug Screen completed in last 360 days      Passed - Valid encounter within last 3 months     Recent Outpatient Visits           3 weeks ago Essential hypertension   [place] [facility] Malva Limes, MD   1 year ago Urinary frequency   [place] [facility] Malva Limes, MD   2 years ago Type 2 diabetes mellitus without complication, without long-term current use of insulin (HCC)   [place] [facility] Malva Limes, MD   2 years ago Type 2 diabetes mellitus without complication, without long-term current use of insulin [facility])   [place] [facility] Malva Limes, MD   3 years ago Type 2 diabetes mellitus without complication, without long-term current use of insulin (HCC)   [place] [facility] Sherrie Mustache, Demetrios Isaacs, MD

## 2022-10-11 MED ORDER — HYDROCODONE-ACETAMINOPHEN 5-325 MG PO TABS
1.0000 | ORAL_TABLET | Freq: Four times a day (QID) | ORAL | 0 refills | Status: DC | PRN
Start: 2022-10-11 — End: 2022-11-11

## 2022-10-16 DIAGNOSIS — R053 Chronic cough: Secondary | ICD-10-CM | POA: Diagnosis not present

## 2022-10-16 DIAGNOSIS — F17218 Nicotine dependence, cigarettes, with other nicotine-induced disorders: Secondary | ICD-10-CM | POA: Diagnosis not present

## 2022-10-16 DIAGNOSIS — F319 Bipolar disorder, unspecified: Secondary | ICD-10-CM | POA: Diagnosis not present

## 2022-10-16 DIAGNOSIS — J449 Chronic obstructive pulmonary disease, unspecified: Secondary | ICD-10-CM | POA: Diagnosis not present

## 2022-10-16 DIAGNOSIS — R0602 Shortness of breath: Secondary | ICD-10-CM | POA: Diagnosis not present

## 2022-10-19 ENCOUNTER — Encounter: Payer: Self-pay | Admitting: Family Medicine

## 2022-10-22 DIAGNOSIS — Z0100 Encounter for examination of eyes and vision without abnormal findings: Secondary | ICD-10-CM | POA: Diagnosis not present

## 2022-10-22 DIAGNOSIS — H5201 Hypermetropia, right eye: Secondary | ICD-10-CM | POA: Diagnosis not present

## 2022-11-10 ENCOUNTER — Other Ambulatory Visit: Payer: Self-pay | Admitting: Family Medicine

## 2022-11-11 ENCOUNTER — Other Ambulatory Visit: Payer: Self-pay | Admitting: Family Medicine

## 2022-11-11 DIAGNOSIS — M5137 Other intervertebral disc degeneration, lumbosacral region: Secondary | ICD-10-CM

## 2022-11-11 DIAGNOSIS — F419 Anxiety disorder, unspecified: Secondary | ICD-10-CM

## 2022-11-11 DIAGNOSIS — F32A Depression, unspecified: Secondary | ICD-10-CM

## 2022-11-11 DIAGNOSIS — R350 Frequency of micturition: Secondary | ICD-10-CM

## 2022-11-11 NOTE — Telephone Encounter (Signed)
Medication Refill - Medication:   HYDROcodone-acetaminophen (NORCO/VICODIN) 5-325 MG tablet  meloxicam (MOBIC) 15 MG tablet  valACYclovir (VALTREX) 1000 MG tablet  escitalopram (LEXAPRO) 20 MG tablet    Has the patient contacted their pharmacy? Yes.   (Agent: If no, request that the patient contact the pharmacy for the refill. If patient does not wish to contact the pharmacy document the reason why and proceed with request.) (Agent: If yes, when and what did the pharmacy advise?)  Preferred Pharmacy (with phone number or street name):  [facility] - [place], [place] - [address]  [address] [place] [address]  Phone: [phone] Fax: [phone]   Has the patient been seen for an appointment in the last year OR does the patient have an upcoming appointment? Yes.    Agent: Please be advised that RX refills may take up to 3 business days. We ask that you follow-up with your pharmacy.

## 2022-11-12 MED ORDER — MELOXICAM 15 MG PO TABS: 15.0000 mg | ORAL_TABLET | Freq: Every day | ORAL | 3 refills | Status: DC | PRN

## 2022-11-12 MED ORDER — VALACYCLOVIR HCL 1 G PO TABS
1000.0000 mg | ORAL_TABLET | Freq: Every day | ORAL | 3 refills | Status: AC
Start: 2022-11-12 — End: ?

## 2022-11-12 MED ORDER — HYDROCODONE-ACETAMINOPHEN 5-325 MG PO TABS
1.0000 | ORAL_TABLET | Freq: Four times a day (QID) | ORAL | 0 refills | Status: DC | PRN
Start: 2022-11-12 — End: 2023-08-05

## 2022-11-12 MED ORDER — ESCITALOPRAM OXALATE 20 MG PO TABS
20.0000 mg | ORAL_TABLET | Freq: Every day | ORAL | 0 refills | Status: DC
Start: 2022-11-12 — End: 2023-01-23

## 2022-11-12 NOTE — Telephone Encounter (Signed)
Requested Prescriptions  Refused Prescriptions Disp Refills   rosuvastatin (CRESTOR) 10 MG tablet [Pharmacy Med Name: ROSUVASTATIN CALCIUM 10 MG Tablet] 90 tablet 3    Sig: TAKE 1 TABLET EVERY DAY     Cardiovascular:  Antilipid - Statins 2 Failed - 11/10/2022  8:53 PM      Failed - Lipid Panel in normal range within the last 12 months    Cholesterol, Total  Date Value Ref Range Status  09/16/2022 199 100 - 199 mg/dL Final   Cholesterol  Date Value Ref Range Status  09/02/2011 183 0 - 200 mg/dL Final   Ldl Cholesterol, Calc  Date Value Ref Range Status  09/02/2011 85 0 - 100 mg/dL Final   LDL Chol Calc (NIH)  Date Value Ref Range Status  09/16/2022 115 (H) 0 - 99 mg/dL Final   HDL Cholesterol  Date Value Ref Range Status  09/02/2011 55 40 - 60 mg/dL Final   HDL  Date Value Ref Range Status  09/16/2022 36 (L) >39 mg/dL Final   Triglycerides  Date Value Ref Range Status  09/16/2022 274 (H) 0 - 149 mg/dL Final  14/78/2956 213 (H) 0 - 200 mg/dL Final         Passed - Cr in normal range and within 360 days    Creatinine  Date Value Ref Range Status  09/02/2011 1.01 0.60 - 1.30 mg/dL Final   Creatinine, Ser  Date Value Ref Range Status  09/16/2022 0.86 0.57 - 1.00 mg/dL Final         Passed - Patient is not pregnant      Passed - Valid encounter within last 12 months    Recent Outpatient Visits           1 month ago Essential hypertension   [place] Malva Limes, MD   1 year ago Urinary frequency   [place] [facility] Malva Limes, MD   2 years ago Type 2 diabetes mellitus without complication, without long-term current use of insulin (HCC)   [facility] Malva Limes, MD   2 years ago Type 2 diabetes mellitus without complication, without long-term current use of insulin [facility])   [place] [facility] Malva Limes, MD   3 years ago Type 2 diabetes mellitus  without complication, without long-term current use of insulin (HCC)   [place] [facility] Sherrie Mustache, Demetrios Isaacs, MD

## 2022-11-12 NOTE — Telephone Encounter (Signed)
Requested medication (s) are due for refill today:Yes  Requested medication (s) are on the active medication list: yes    Last refill: 10/10/24 #120  0 refills  Future visit scheduled No  Notes to clinic:Not delegated, please review. Thank you.  Requested Prescriptions  Pending Prescriptions Disp Refills   HYDROcodone-acetaminophen (NORCO/VICODIN) 5-325 MG tablet 120 tablet 0    Sig: Take 1-2 tablets by mouth every 6 (six) hours as needed for moderate pain.     Not Delegated - Analgesics:  Opioid Agonist Combinations Failed - 11/11/2022 12:50 PM      Failed - This refill cannot be delegated      Failed - Urine Drug Screen completed in last 360 days      Passed - Valid encounter within last 3 months    Recent Outpatient Visits           1 month ago Essential hypertension   [place] [facility] Malva L[facility], [facility]   1 year ago Urinary frequency   [place] [facility] Malva Limes, MD   2 years ago Type 2 diabetes mellitus without complication, without long-term current use of insulin (HCC)   [place] [facility] Malva Limes, MD   2 years ago Type 2 diabetes mellitus without complication, without long-term current use of insulin (HCC)   [place] [facility] Malva Limes, MD   3 years ago Type 2 diabetes mellitus without complication, without long-term current use of insulin (HCC)   [place] [facility] Malva Limes, MD              Signed Prescriptions Disp Refills   meloxicam (MOBIC) 15 MG tablet 90 tablet 3    Sig: Take 1 tablet (15 mg total) by mouth daily as needed.     Analgesics:  COX2 Inhibitors Failed - 11/11/2022 12:50 PM      Failed - Manual Review: Labs are only required if the patient has taken medication for more than 8 weeks.      Passed - HGB in normal range and within 360 days    Hemoglobin  Date Value Ref Range Status  09/16/2022 14.4 11.1 - 15.9  g/dL Final         Passed - Cr in normal range and within 360 days    Creatinine  Date Value Ref Range Status  09/02/2011 1.01 0.60 - 1.30 mg/dL Final   Creatinine, Ser  Date Value Ref Range Status  09/16/2022 0.86 0.57 - 1.00 mg/dL Final         Passed - HCT in normal range and within 360 days    Hematocrit  Date Value Ref Range Status  09/16/2022 41.6 34.0 - 46.6 % Final         Passed - AST in normal range and within 360 days    AST  Date Value Ref Range Status  09/16/2022 16 0 - 40 IU/L Final   SGOT(AST)  Date Value Ref Range Status  09/01/2011 23 15 - 37 Unit/L Final         Passed - ALT in normal range and within 360 days    ALT  Date Value Ref Range Status  09/16/2022 17 0 - 32 IU/L Final   SGPT (ALT)  Date Value Ref Range Status  09/01/2011 43 U/L Final    Comment:    12-78 NOTE: NEW REFERENCE RANGE 02/08/2011  Passed - eGFR is 30 or above and within 360 days    EGFR (African American)  Date Value Ref Range Status  09/02/2011 >60  Final   GFR calc Af Amer  Date Value Ref Range Status  07/12/2019 95 >59 mL/min/1.73 Final    Comment:    **[facility] currently reports eGFR in compliance with the current**   recommendations of the [employer]. [facility] will   update reporting as new guidelines are published from the NKF-ASN   Task force.    EGFR (Non-African Amer.)  Date Value Ref Range Status  09/02/2011 >60  Final    Comment:    eGFR values <4mL/min/1.73 m2 may be an indication of chronic kidney disease (CKD). Calculated eGFR is useful in patients with stable renal function. The eGFR calculation will not be reliable in acutely ill patients when serum creatinine is changing rapidly. It is not useful in  patients on dialysis. The eGFR calculation may not be applicable to patients at the low and high extremes of body sizes, pregnant women, and vegetarians.    GFR calc non Af Amer  Date Value Ref Range Status  07/12/2019  82 >59 mL/min/1.73 Final   eGFR  Date Value Ref Range Status  09/16/2022 74 >59 mL/min/1.73 Final         Passed - Patient is not pregnant      Passed - Valid encounter within last 12 months    Recent Outpatient Visits           1 month ago Essential hypertension   [facility] Mal[facility] L[facility], MD   1 year ago Urinary frequency   [place] [facility] Malva Limes, MD   2 years ago Type 2 diabetes mellitus without complication, without long-term current use of insulin (HCC)   [place] [facility] Mal[facility], [facility]   2 years ago Type 2 diabetes mellitus without complication, without long-term current use of insulin (HCC)   [place] [facility] Mal[facility] Limes, MD   3 years ago Type 2 diabetes mellitus without complication, without long-term current use of insulin (HCC)   [place] [facility] Malva Limes, MD               valACYclovir (VALTREX) 1000 MG tablet 90 tablet 3    Sig: Take 1 tablet (1,000 mg total) by mouth daily.     Antimicrobials:  Antiviral Agents - Anti-Herpetic Passed - 11/11/2022 12:50 PM      Passed - Valid encounter within last 12 months    Recent Outpatient Visits           1 month ago Essential hypertension   [facility] L[facility], MD   1 year ago Urinary frequency   [place] [facility] Malva Limes, MD   2 years ago Type 2 diabetes mellitus without complication, without long-term current use of insulin (HCC)   [place] [facility] Malva Limes, MD   2 years ago Type 2 diabetes mellitus without complication, without long-term current use of insulin [facility])   [place] [facility] Malva Limes, MD   3 years ago Type 2 diabetes mellitus without complication, without long-term current use of insulin (HCC)   [facility]  Malva Limes, MD               escitalopram (LEXAPRO)  20 MG tablet 90 tablet 0    Sig: Take 1 tablet (20 mg total) by mouth daily.     Psychiatry:  Antidepressants - SSRI Passed - 11/11/2022 12:50 PM      Passed - Completed PHQ-2 or PHQ-9 in the last 360 days      Passed - Valid encounter within last 6 months    Recent Outpatient Visits           1 month ago Essential hypertension   [place] [facility] Malva Limes, MD   1 year ago Urinary frequency   [place] [facility] Malva Limes, MD   2 years ago Type 2 diabetes mellitus without complication, without long-term current use of insulin (HCC)   [place] [facility] Malva Limes, MD   2 years ago Type 2 diabetes mellitus without complication, without long-term current use of insulin [facility])   [place] [facility] Malva Limes, MD   3 years ago Type 2 diabetes mellitus without complication, without long-term current use of insulin (HCC)   [place] [facility] Sherrie Mustache, Demetrios Isaacs, MD

## 2022-11-12 NOTE — Telephone Encounter (Signed)
Requested Prescriptions  Pending Prescriptions Disp Refills   HYDROcodone-acetaminophen (NORCO/VICODIN) 5-325 MG tablet 120 tablet 0    Sig: Take 1-2 tablets by mouth every 6 (six) hours as needed for moderate pain.     Not Delegated - Analgesics:  Opioid Agonist Combinations Failed - 11/11/2022 12:50 PM      Failed - This refill cannot be delegated      Failed - Urine Drug Screen completed in last 360 days      Passed - Valid encounter within last 3 months    Recent Outpatient Visits           1 month ago Essential hypertension   [place] [facility] Malva Limes, MD   1 year ago Urinary frequency   [place] [facility] Mal[facility] Limes, MD   2 years ago Type 2 diabetes mellitus without complication, without long-term current use of insulin (HCC)   [place] [facility] Malva Limes, MD   2 years ago Type 2 diabetes mellitus without complication, without long-term current use of insulin (HCC)   [place] [facility] Malva Limes, MD   3 years ago Type 2 diabetes mellitus without complication, without long-term current use of insulin (HCC)   [facility] Malva Limes, MD               meloxicam (MOBIC) 15 MG tablet 90 tablet 3    Sig: Take 1 tablet (15 mg total) by mouth daily as needed.     Analgesics:  COX2 Inhibitors Failed - 11/11/2022 12:50 PM      Failed - Manual Review: Labs are only required if the patient has taken medication for more than 8 weeks.      Passed - HGB in normal range and within 360 days    Hemoglobin  Date Value Ref Range Status  09/16/2022 14.4 11.1 - 15.9 g/dL Final         Passed - Cr in normal range and within 360 days    Creatinine  Date Value Ref Range Status  09/02/2011 1.01 0.60 - 1.30 mg/dL Final   Creatinine, Ser  Date Value Ref Range Status  09/16/2022 0.86 0.57 - 1.00 mg/dL Final         Passed - HCT in normal range  and within 360 days    Hematocrit  Date Value Ref Range Status  09/16/2022 41.6 34.0 - 46.6 % Final         Passed - AST in normal range and within 360 days    AST  Date Value Ref Range Status  09/16/2022 16 0 - 40 IU/L Final   SGOT(AST)  Date Value Ref Range Status  09/01/2011 23 15 - 37 Unit/L Final         Passed - ALT in normal range and within 360 days    ALT  Date Value Ref Range Status  09/16/2022 17 0 - 32 IU/L Final   SGPT (ALT)  Date Value Ref Range Status  09/01/2011 43 U/L Final    Comment:    12-78 NOTE: NEW REFERENCE RANGE 02/08/2011          Passed - eGFR is 30 or above and within 360 days    EGFR (African American)  Date Value Ref Range Status  09/02/2011 >60  Final   GFR calc Af Amer  Date Value Ref Range Status  07/12/2019 95 >59 mL/min/1.73 Final  Comment:    **[facility] currently reports eGFR in compliance with the current**   recommendations of the [employer]. [facility] will   update reporting as new guidelines are published from the NKF-ASN   Task force.    EGFR (Non-African Amer.)  Date Value Ref Range Status  09/02/2011 >60  Final    Comment:    eGFR values <80mL/min/1.73 m2 may be an indication of chronic kidney disease (CKD). Calculated eGFR is useful in patients with stable renal function. The eGFR calculation will not be reliable in acutely ill patients when serum creatinine is changing rapidly. It is not useful in  patients on dialysis. The eGFR calculation may not be applicable to patients at the low and high extremes of body sizes, pregnant women, and vegetarians.    GFR calc non Af Amer  Date Value Ref Range Status  07/12/2019 82 >59 mL/min/1.73 Final   eGFR  Date Value Ref Range Status  09/16/2022 74 >59 mL/min/1.73 Final         Passed - Patient is not pregnant      Passed - Valid encounter within last 12 months    Recent Outpatient Visits           1 month ago Essential hypertension   [facility]   1 year ago Urinary frequency   [place] [facility] Mal[facility] Limes, MD   2 years ago Type 2 diabetes mellitus without complication, without long-term current use of insulin (HCC)   [place] [facility] Malva Limes, MD   2 years ago Type 2 diabetes mellitus without complication, without long-term current use of insulin (HCC)   [place] [facility] Malva Limes, MD   3 years ago Type 2 diabetes mellitus without complication, without long-term current use of insulin (HCC)   [place] [facility] Malva Limes, MD               valACYclovir (VALTREX) 1000 MG tablet 90 tablet 3    Sig: Take 1 tablet (1,000 mg total) by mouth daily.     Antimicrobials:  Antiviral Agents - Anti-Herpetic Passed - 11/11/2022 12:50 PM      Passed - Valid encounter within last 12 months    Recent Outpatient Visits           1 month ago Essential hypertension   [facility] Mal[facility] L[facility], [facility]   1 year ago Urinary frequency   [place] [facility] Malva Limes, MD   2 years ago Type 2 diabetes mellitus without complication, without long-term current use of insulin (HCC)   [place] [facility] Malva Limes, MD   2 years ago Type 2 diabetes mellitus without complication, without long-term current use of insulin (HCC)   [place] [facility], [facility]   3 years ago Type 2 diabetes mellitus without complication, without long-term current use of insulin (HCC)   [place] [facility] Mal[facility] Limes, MD               escitalopram (LEXAPRO) 20 MG tablet 90 tablet 0    Sig: Take 1 tablet (20 mg total) by mouth daily.     Psychiatry:  Antidepressants - SSRI Passed - 11/11/2022 12:50 PM      Passed - Completed PHQ-2 or PHQ-9 in the last 360 days  Passed - Valid encounter within last 6 months    Recent Outpatient Visits           1 month ago Essential hypertension   [place] [facility] Mal[facility] L[facility], [facility]   1 year ago Urinary frequency   [place] [facility] Malva Limes, MD   2 years ago Type 2 diabetes mellitus without complication, without long-term current use of insulin [facility])   [facility] Mal[facility], MD   2 years ago Type 2 diabetes mellitus without complication, without long-term current use of insulin [facility])   [place] [facility] Malva Limes, MD   3 years ago Type 2 diabetes mellitus without complication, without long-term current use of insulin (HCC)   [place] [facility] Sherrie Mustache, Demetrios Isaacs, MD

## 2022-11-14 ENCOUNTER — Encounter: Payer: Self-pay | Admitting: Family Medicine

## 2022-11-14 DIAGNOSIS — I2510 Atherosclerotic heart disease of native coronary artery without angina pectoris: Secondary | ICD-10-CM | POA: Insufficient documentation

## 2022-11-15 ENCOUNTER — Other Ambulatory Visit: Payer: Self-pay | Admitting: Family Medicine

## 2022-11-15 DIAGNOSIS — I10 Essential (primary) hypertension: Secondary | ICD-10-CM

## 2023-01-23 ENCOUNTER — Other Ambulatory Visit: Payer: Self-pay | Admitting: Family Medicine

## 2023-01-23 DIAGNOSIS — F32A Depression, unspecified: Secondary | ICD-10-CM

## 2023-01-23 DIAGNOSIS — N951 Menopausal and female climacteric states: Secondary | ICD-10-CM

## 2023-01-23 DIAGNOSIS — F419 Anxiety disorder, unspecified: Secondary | ICD-10-CM

## 2023-01-23 NOTE — Telephone Encounter (Signed)
Requested Prescriptions  Pending Prescriptions Disp Refills   estradiol (ESTRACE) 0.5 MG tablet [Pharmacy Med Name: Estradiol Oral Tablet 0.5 MG] 90 tablet 2    Sig: TAKE 1 TABLET EVERY DAY     OB/GYN:  Estrogens Failed - 01/23/2023  3:09 AM      Failed - Mammogram is up-to-date per Health Maintenance      Passed - Last BP in normal range    BP Readings from Last 1 Encounters:  10/09/22 124/71         Passed - Valid encounter within last 12 months    Recent Outpatient Visits           4 months ago Essential hypertension   [place] [facility] Mal[facility] L[facility], MD   1 year ago Urinary frequency   [place] [facility] Mal[facility], MD   2 years ago Type 2 diabetes mellitus without complication, without long-term current use of insulin (HCC)   [place] [facility] Mal[facility] Limes, MD   2 years ago Type 2 diabetes mellitus without complication, without long-term current use of insulin (HCC)   [place] [facility] Malva Limes, MD   3 years ago Type 2 diabetes mellitus without complication, without long-term current use of insulin (HCC)   [place] [facility] Sherrie Mustache, Demetrios Isaacs, MD               escitalopram (LEXAPRO) 20 MG tablet [Pharmacy Med Name: Escitalopram Oxalate Oral Tablet 20 MG] 90 tablet 0    Sig: TAKE 1 TABLET EVERY DAY     Psychiatry:  Antidepressants - SSRI Passed - 01/23/2023  3:09 AM      Passed - Completed PHQ-2 or PHQ-9 in the last 360 days      Passed - Valid encounter within last 6 months    Recent Outpatient Visits           4 months ago Essential hypertension   [place] Malva Limes, MD   1 year ago Urinary frequency   [place] [facility] Malva Limes, MD   2 years ago Type 2 diabetes mellitus without complication, without long-term current use of insulin (HCC)   [place] [facility] Malva Limes, MD   2 years ago Type 2 diabetes mellitus without complication, without long-term current use of insulin (HCC)   [place] [facility] Malva Limes, MD   3 years ago Type 2 diabetes mellitus without complication, without long-term current use of insulin (HCC)   [place] [facility] Sherrie Mustache, Demetrios Isaacs, MD

## 2023-02-04 ENCOUNTER — Telehealth: Payer: Self-pay

## 2023-02-04 NOTE — Patient Outreach (Signed)
Attempted to contact patient regarding MM, Urine micro. Left voicemail for patient to return my call at [phone].  Nicholes Rough, CMA Care Guide [employer]

## 2023-04-06 ENCOUNTER — Other Ambulatory Visit: Payer: Self-pay | Admitting: Family Medicine

## 2023-04-06 DIAGNOSIS — F32A Depression, unspecified: Secondary | ICD-10-CM

## 2023-04-06 DIAGNOSIS — F419 Anxiety disorder, unspecified: Secondary | ICD-10-CM

## 2023-05-10 ENCOUNTER — Other Ambulatory Visit: Payer: Self-pay | Admitting: Family Medicine

## 2023-07-09 ENCOUNTER — Telehealth: Payer: Self-pay | Admitting: Family Medicine

## 2023-07-10 NOTE — Telephone Encounter (Signed)
 NA at this moment. Mailbox is full. Will try later.

## 2023-07-10 NOTE — Telephone Encounter (Signed)
 Patient is overdue for follow up appointment. Need to schedule before refills can be approved.

## 2023-08-04 ENCOUNTER — Other Ambulatory Visit: Payer: Self-pay | Admitting: Family Medicine

## 2023-08-04 DIAGNOSIS — M51379 Other intervertebral disc degeneration, lumbosacral region without mention of lumbar back pain or lower extremity pain: Secondary | ICD-10-CM

## 2023-08-04 NOTE — Telephone Encounter (Signed)
 Copied from CRM [id number]. Topic: Clinical - Medication Refill >> Aug 04, 2023  6:01 PM Chuck Crater wrote: Medication: HYDROcodone -acetaminophen  (NORCO/VICODIN) 5-325 MG tablet  Has the patient contacted their pharmacy? No (Agent: If no, request that the patient contact the pharmacy for the refill. If patient does not wish to contact the pharmacy document the reason why and proceed with request.) (Agent: If yes, when and what did the pharmacy advise?)advise to call doctor   This is the patient's preferred pharmacy:  [facility] [place], [place] - [address] [place] [address] Phone: [phone] Fax: [phone]  Is this the correct pharmacy for this prescription? Yes If no, delete pharmacy and type the correct one.   Has the prescription been filled recently? No  Is the patient out of the medication? No 4 pills left   Has the patient been seen for an appointment in the last year OR does the patient have an upcoming appointment? Yes  Can we respond through MyChart? Yes  Agent: Please be advised that Rx refills may take up to 3 business days. We ask that you follow-up with your pharmacy.

## 2023-08-06 MED ORDER — HYDROCODONE-ACETAMINOPHEN 5-325 MG PO TABS
1.0000 | ORAL_TABLET | Freq: Four times a day (QID) | ORAL | 0 refills | Status: DC | PRN
Start: 2023-08-06 — End: 2023-08-29

## 2023-08-06 NOTE — Telephone Encounter (Signed)
 Requested medication (s) are due for refill today: yes  Requested medication (s) are on the active medication list: yes  Last refill:  11/12/22  Future visit scheduled: yes  Notes to clinic:  Unable to refill per protocol, cannot delegate.      Requested Prescriptions  Pending Prescriptions Disp Refills   HYDROcodone -acetaminophen  (NORCO/VICODIN) 5-325 MG tablet 120 tablet 0    Sig: Take 1-2 tablets by mouth every 6 (six) hours as needed for moderate pain (pain score 4-6).     Not Delegated - Analgesics:  Opioid Agonist Combinations Failed - 08/06/2023  1:39 PM      Failed - This refill cannot be delegated      Failed - Urine Drug Screen completed in last 360 days      Failed - Valid encounter within last 3 months    Recent Outpatient Visits   None

## 2023-08-13 ENCOUNTER — Ambulatory Visit: Payer: Self-pay

## 2023-08-13 DIAGNOSIS — F17200 Nicotine dependence, unspecified, uncomplicated: Secondary | ICD-10-CM

## 2023-08-13 DIAGNOSIS — Z0000 Encounter for general adult medical examination without abnormal findings: Secondary | ICD-10-CM

## 2023-08-13 DIAGNOSIS — Z780 Asymptomatic menopausal state: Secondary | ICD-10-CM

## 2023-08-13 DIAGNOSIS — Z1231 Encounter for screening mammogram for malignant neoplasm of breast: Secondary | ICD-10-CM

## 2023-08-13 DIAGNOSIS — Z0001 Encounter for general adult medical examination with abnormal findings: Secondary | ICD-10-CM | POA: Diagnosis not present

## 2023-08-13 NOTE — Progress Notes (Signed)
 Subjective:   Misty Bishop is a 68 y.o. who presents for a [facility] preventive visit.  As a reminder, Annual Wellness Visits don't include a physical exam, and some assessments may be limited, especially if this visit is performed virtually. We may recommend an in-person follow-up visit with your provider if needed.  Visit Complete: Virtual I connected with  Misty Bishop on 08/13/23 by a audio enabled telemedicine application and verified that I am speaking with the correct person using two identifiers.  Patient Location: Home  Provider Location: Home Office  I discussed the limitations of evaluation and management by telemedicine. The patient expressed understanding and agreed to proceed.  Vital Signs: Because this visit was a virtual/telehealth visit, some criteria may be missing or patient reported. Any vitals not documented were not able to be obtained and vitals that have been documented are patient reported.  VideoDeclined- This patient declined Librarian, academic. Therefore the visit was completed with audio only.  Persons Participating in Visit: Patient.  AWV Questionnaire: No: Patient Medicare AWV questionnaire was not completed prior to this visit.  Cardiac Risk Factors include: advanced age (>64men, >49 women);hypertension;diabetes mellitus;dyslipidemia;female gender;obesity (BMI >30kg/m2);sedentary lifestyle;smoking/ tobacco exposure     Objective:     Today's Vitals   08/13/23 1107  PainSc: 6    There is no height or weight on file to calculate BMI.     08/13/2023   11:16 AM 08/07/2022   12:10 PM 07/10/2021    8:25 AM 07/06/2017   12:12 PM 09/12/2015    4:25 PM  Advanced Directives  Does Patient Have a Medical Advance Directive? No No No No No  Would patient like information on creating a medical advance directive? No - Patient declined  No - Patient declined No - Patient declined     Current Medications  (verified) Outpatient Encounter Medications as of 08/13/2023  Medication Sig   Accu-Chek FastClix Lancets MISC CHECK BLOOD SUGAR EVERY DAY FOR TYPE 2 DIABETES E11.9   Accu-Chek Softclix Lancets lancets Use to check blood sugar daily for type 2 diabetes. E11.9   albuterol  (VENTOLIN  HFA) 108 (90 Base) MCG/ACT inhaler Inhale 2 puffs into the lungs every 6 (six) hours as needed.   amLODipine  (NORVASC ) 2.5 MG tablet TAKE 1 TABLET EVERY DAY   aspirin 81 MG EC tablet Take 81 mg by mouth daily.   b complex vitamins capsule Take 1 capsule by mouth Once PRN.   Blood Glucose Monitoring Suppl (ACCU-CHEK AVIVA PLUS) w/Device KIT Use to check blood sugar daily for type 2 diabetes. E11.9   clonazePAM  (KLONOPIN ) 1 MG tablet Take 1-2 tablets daily and 2 tablets at bedtime.   dexlansoprazole  (DEXILANT ) 60 MG capsule Take 1 capsule (60 mg total) by mouth daily.   diclofenac  (VOLTAREN ) 75 MG EC tablet TAKE 1 TABLET(75 MG) BY MOUTH TWICE DAILY AS NEEDED   diclofenac  Sodium (VOLTAREN ) 1 % GEL APPLY 1 APPLICATION TOPICALLY 4 (FOUR) TIMES DAILY AS NEEDED.   escitalopram  (LEXAPRO ) 20 MG tablet TAKE 1 TABLET EVERY DAY   estradiol  (ESTRACE ) 0.5 MG tablet TAKE 1 TABLET EVERY DAY   fluconazole  (DIFLUCAN ) 150 MG tablet Take 1 tablet (150 mg total) by mouth every 3 (three) days. For three doses   fluticasone  (FLONASE ) 50 MCG/ACT nasal spray Place 2 sprays into both nostrils daily.   glucose blood (ACCU-CHEK AVIVA PLUS) test strip Use to check blood sugar once a day for type 2 diabetes e11.9   HYDROcodone -acetaminophen  (NORCO/VICODIN) 5-325  MG tablet Take 1-2 tablets by mouth every 6 (six) hours as needed for moderate pain (pain score 4-6). PATIENT NEEDS OFFICE VISIT FOR FOLLOW UP   loratadine (CLARITIN) 10 MG tablet Take 1 tablet by mouth daily.   losartan -hydrochlorothiazide (HYZAAR) 100-25 MG tablet TAKE 1 TABLET EVERY DAY. PATIENT NEEDS TO SCHEDULE OFFICE VISIT FOR FOLLOW UP.   medroxyPROGESTERone  (PROVERA ) 2.5 MG tablet  TAKE 1 TABLET EVERY DAY   meloxicam  (MOBIC ) 15 MG tablet Take 1 tablet (15 mg total) by mouth daily as needed.   metFORMIN  (GLUCOPHAGE -XR) 500 MG 24 hr tablet Take 2 tablets (1,000 mg total) by mouth daily with breakfast.   montelukast  (SINGULAIR ) 10 MG tablet Take 1 tablet (10 mg total) by mouth daily.   nitroGLYCERIN  (NITROSTAT ) 0.4 MG SL tablet Place 1 tablet (0.4 mg total) under the tongue every 5 (five) minutes as needed for chest pain (Up to 3 tablets).   Omega 3-6-9 Fatty Acids (OMEGA 3-6-9 COMPLEX) CAPS Take 1 capsule by mouth daily.   Probiotic Product (PROBIOTIC PO) Take 3 tablets by mouth daily.   ranitidine  (ZANTAC ) 150 MG capsule Take 1 capsule (150 mg total) by mouth 2 (two) times daily.   rosuvastatin  (CRESTOR ) 10 MG tablet TAKE 1 TABLET EVERY DAY   sucralfate  (CARAFATE ) 1 g tablet Take 1 tablet (1 g total) by mouth 2 (two) times daily.   traMADol -acetaminophen  (ULTRACET ) 37.5-325 MG tablet Take 1 tablet by mouth every 6 (six) hours as needed.   valACYclovir  (VALTREX ) 1000 MG tablet Take 1 tablet (1,000 mg total) by mouth daily.   vitamin C (ASCORBIC ACID) 250 MG tablet Take 4 tablets by mouth daily.   No facility-administered encounter medications on file as of 08/13/2023.    Allergies (verified) Levofloxacin, Levaquin  [levofloxacin in d5w], Penicillins, and Sulfa antibiotics   History: Past Medical History:  Diagnosis Date   Bipolar disorder (HCC)    Chronic gastritis with bleeding    Depression    Diabetes mellitus without complication (HCC)    Headache(784.0)    Hypertension    Palpitations    Holter monitor in 2008 showed PVCs   Peptic ulcer disease    PTSD (post-traumatic stress disorder)    Past Surgical History:  Procedure Laterality Date   BREAST BIOPSY     Left, benign   CESAREAN SECTION     KNEE SURGERY     Left   SHOULDER SURGERY     Left   TONSILLECTOMY     Family History  Problem Relation Age of Onset   Heart attack Mother 78       MI    Cancer Father        skin with mets to prostate and brain   COPD Father    Heart disease Sister        Palpitations   Diabetes Sister    Obesity Sister    Cancer Maternal Aunt        Leukemia   Schizophrenia Sister    COPD Sister    Social History   Socioeconomic History   Marital status: Single    Spouse name: Not on file   Number of children: 1   Years of education: Not on file   Highest education level: Master's degree (e.g., MA, MS, MEng, MEd, MSW, MBA)  Occupational History   Occupation: Disabled     Comment: disabeled due to chronic pain in low back- approved in 08/2005  Tobacco Use   Smoking status: Every Day  Current packs/day: 2.00    Average packs/day: 2.0 packs/day for 34.0 years (68.0 ttl pk-yrs)    Types: Cigarettes   Smokeless tobacco: Never  Vaping Use   Vaping status: Never Used  Substance and Sexual Activity   Alcohol use: Yes    Alcohol/week: 0.0 standard drinks of alcohol    Comment: occasional use   Drug use: Yes    Types: Marijuana   Sexual activity: Yes    Birth control/protection: None  Other Topics Concern   Not on file  Social History Narrative   Disabled (back pain)   Originally from [place]    Has a daughter   Does not get regular exercise   Social Drivers of Health   Financial Resource Strain: Medium Risk (08/13/2023)   Overall Financial Resource Strain (CARDIA)    Difficulty of Paying Living Expenses: Somewhat hard  Food Insecurity: No Food Insecurity (08/13/2023)   Hunger Vital Sign    Worried About Running Out of Food in the Last Year: Never true    Ran Out of Food in the Last Year: Never true  Transportation Needs: No Transportation Needs (08/13/2023)   PRAPARE - Administrator, Civil Service (Medical): No    Lack of Transportation (Non-Medical): No  Physical Activity: Insufficiently Active (08/13/2023)   Exercise Vital Sign    Days of Exercise per Week: 3 days    Minutes of Exercise per Session: 20 min  Stress:  Stress Concern Present (08/13/2023)   [employer] of Occupational Health - Occupational Stress Questionnaire    Feeling of Stress : Rather much  Social Connections: Moderately Integrated (08/13/2023)   Social Connection and Isolation Panel [NHANES]    Frequency of Communication with Friends and Family: More than three times a week    Frequency of Social Gatherings with Friends and Family: Once a week    Attends Religious Services: 1 to 4 times per year    Active Member of [employer] or Organizations: No    Attends Engineer, structural: Never    Marital Status: Living with partner    Tobacco Counseling Ready to quit: Not Answered Counseling given: Not Answered    Clinical Intake:  Pre-visit preparation completed: Yes  Pain : 0-10 Pain Score: 6  Pain Type: Chronic pain Pain Location: Back Pain Radiating Towards: left leg Pain Descriptors / Indicators: Aching, Discomfort, Constant Pain Onset: More than a month ago Pain Frequency: Constant     BMI - recorded: 35.4 Nutritional Status: BMI > 30  Obese Nutritional Risks: Other (Comment) Diabetes: Yes CBG done?: No Did pt. bring in CBG monitor from home?: No  Lab Results  Component Value Date   HGBA1C 6.0 (H) 09/16/2022   HGBA1C 6.1 (H) 05/14/2021   HGBA1C 5.7 (A) 08/15/2020     How often do you need to have someone help you when you read instructions, pamphlets, or other written materials from your doctor or pharmacy?: 1 - Never  Interpreter Needed?: No  Information entered by :: Dellie Fergusson, LPN   Activities of Daily Living     08/13/2023   11:17 AM  In your present state of health, do you have any difficulty performing the following activities:  Hearing? 0  Vision? 0  Difficulty concentrating or making decisions? 0  Walking or climbing stairs? 1  Comment BACK PAIN; FATIGUE  Dressing or bathing? 0  Doing errands, shopping? 0  Preparing Food and eating ? N  Using the Toilet? N  In the past  six  months, have you accidently leaked urine? N  Do you have problems with loss of bowel control? N  Managing your Medications? N  Managing your Finances? N  Housekeeping or managing your Housekeeping? N    Patient Care Team: Lamon Pillow, MD as PCP - General (Family Medicine) Everlena Hoard, OD as Referring Physician (Optometry)  Indicate any recent Medical Services you may have received from other than [facility] providers in the past year (date may be approximate).     Assessment:    This is a routine wellness examination for [place].  Hearing/Vision screen Hearing Screening - Comments:: NO AIDS Vision Screening - Comments:: HAS GLASSES   Goals Addressed             This Visit's Progress    DIET - INCREASE WATER INTAKE         Depression Screen     08/13/2023   11:13 AM 08/07/2022   12:06 PM 07/10/2021    8:21 AM 05/14/2021   10:35 AM 08/15/2020   11:39 AM 01/25/2020    2:39 PM 07/06/2019   10:35 AM  PHQ 2/9 Scores  PHQ - 2 Score 2 2 2 4 3 3 3   PHQ- 9 Score 3 7 8 17 14 14 14     Fall Risk     08/13/2023   11:16 AM 08/07/2022   12:03 PM 07/10/2021    8:26 AM 08/15/2020   11:41 AM 07/06/2019   10:35 AM  Fall Risk   Falls in the past year? 0 0 0 0 0  Number falls in past yr: 0 0 0 0 0  Injury with Fall? 0 0 0 0 0  Risk for fall due to : No Fall Risks No Fall Risks No Fall Risks    Follow up Falls evaluation completed Falls prevention discussed;Education provided Falls evaluation completed Falls evaluation completed     MEDICARE RISK AT HOME:     TIMED UP AND GO:  Was the test performed?  No  Cognitive Function: 6CIT completed        08/07/2022   12:16 PM 02/07/2017   10:37 AM  6CIT Screen  What Year? 0 points 0 points  What month? 0 points 0 points  What time? 0 points 0 points  Count back from 20 0 points 0 points  Months in reverse 0 points 0 points  Repeat phrase 0 points 0 points  Total Score 0 points 0 points    Immunizations Immunization  History  Administered Date(s) Administered   Hepatitis A, Adult 09/05/2017   Hepatitis B, ADULT 09/05/2017   Influenza,inj,Quad PF,6+ Mos 02/07/2017, 01/25/2020   PFIZER(Purple Top)SARS-COV-2 Vaccination 07/05/2019, 07/27/2019, 01/25/2020   Pfizer Covid-19 Vaccine Bivalent Booster 86yrs & up 09/07/2021   Pneumococcal Polysaccharide-23 02/07/2017, 09/07/2021   Tdap 01/14/2007   Zoster, Unspecified 09/07/2021    Screening Tests Health Maintenance  Topic Date Due   Diabetic kidney evaluation - Urine ACR  Never done   Zoster Vaccines- Shingrix (1 of 2) 02/28/1975   MAMMOGRAM  10/05/2016   DTaP/Tdap/Td (2 - Td or Tdap) 01/13/2017   OPHTHALMOLOGY EXAM  05/17/2017   DEXA SCAN  Never done   Pneumonia Vaccine 17+ Years old (2 of 2 - PCV) 09/08/2022   COVID-19 Vaccine (5 - 2024-25 season) 11/17/2022   HEMOGLOBIN A1C  03/19/2023   Diabetic kidney evaluation - eGFR measurement  09/16/2023   Lung Cancer Screening  10/09/2023   INFLUENZA VACCINE  10/17/2023   Medicare  Annual Wellness (AWV)  08/12/2024   Fecal DNA (Cologuard)  09/29/2025   Hepatitis C Screening  Completed   HPV VACCINES  Aged Out   Meningococcal B Vaccine  Aged Out    Health Maintenance  Health Maintenance Due  Topic Date Due   Diabetic kidney evaluation - Urine ACR  Never done   Zoster Vaccines- Shingrix (1 of 2) 02/28/1975   MAMMOGRAM  10/05/2016   DTaP/Tdap/Td (2 - Td or Tdap) 01/13/2017   OPHTHALMOLOGY EXAM  05/17/2017   DEXA SCAN  Never done   Pneumonia Vaccine 52+ Years old (2 of 2 - PCV) 09/08/2022   COVID-19 Vaccine (5 - 2024-25 season) 11/17/2022   HEMOGLOBIN A1C  03/19/2023   Health Maintenance Items Addressed: Mammogram ordered; DEXA ORDERED; UP TO DATE ON COLOGUARD; DECLINES VACCINES   Additional Screening:  Vision Screening: Recommended annual ophthalmology exams for early detection of glaucoma and other disorders of the eye.  Dental Screening: Recommended annual dental exams for proper oral  hygiene  Community Resource Referral / Chronic Care Management: CRR required this visit?  No   CCM required this visit?  No   Plan:    I have personally reviewed and noted the following in the patient's chart:   Medical and social history Use of alcohol, tobacco or illicit drugs  Current medications and supplements including opioid prescriptions. Patient is currently taking opioid prescriptions. Information provided to patient regarding non-opioid alternatives. Patient advised to discuss non-opioid treatment plan with their provider. Functional ability and status Nutritional status Physical activity Advanced directives List of other physicians Hospitalizations, surgeries, and ER visits in previous 12 months Vitals Screenings to include cognitive, depression, and falls Referrals and appointments  In addition, I have reviewed and discussed with patient certain preventive protocols, quality metrics, and best practice recommendations. A written personalized care plan for preventive services as well as general preventive health recommendations were provided to patient.   Pinky Bright, LPN   4/54/0981   After Visit Summary: (MyChart) Due to this being a telephonic visit, the after visit summary with patients personalized plan was offered to patient via MyChart   Notes: MAMMOGRAM & BONE DENSITY SCAN ORDERED; LUNG CA SCREENING ORDERED

## 2023-08-13 NOTE — Patient Instructions (Addendum)
 Misty Bishop , Thank you for taking time out of your busy schedule to complete your Annual Wellness Visit with me. I enjoyed our conversation and look forward to speaking with you again next year. I, as well as your care team,  appreciate your ongoing commitment to your health goals. Please review the following plan we discussed and let me know if I can assist you in the future.   Referrals: If you haven't heard from the office you've been referred to, please reach out to them at the phone provided.   You have an order for:  []   2D Mammogram  [x]   3D Mammogram  [x]   Bone Density     Please call for appointment:  [facility]  [address] [place] [address] [phone] [facility] [address] [place], [place] [address] [phone] [facility] [address] [place], [place] [address] [phone]   Make sure to wear two-piece clothing.  No lotions, powders, or deodorants the day of the appointment. Make sure to bring picture ID and insurance card.  Bring list of medications you are currently taking including any supplements.   Schedule your [place] screening mammogram through MyChart!   Log into your MyChart account.  Go to 'Visit' (or 'Appointments' if on mobile App) --> Schedule an Appointment  Under 'Select a Reason for Visit' choose the Mammogram Screening option.  Complete the pre-visit questions and select the time and place that best fits your schedule.   REFERRAL SENT FOR LUNG CANCER SCREENING  Follow up Visits: Next Medicare AWV with our clinical staff:   08/24/24 @ 10:50 AM BY PHONE Have you seen your provider in the last 6 months (3 months if uncontrolled diabetes)? Yes   Clinician Recommendations:  Aim for 30 minutes of exercise or brisk walking, 6-8 glasses of water, and 5 servings of fruits and vegetables each day. TAKE  CARE!      This is a list of the screening recommended for you and due dates:  Health Maintenance  Topic Date Due   Yearly kidney health urinalysis for diabetes  Never done   Zoster (Shingles) Vaccine (1 of 2) 02/28/1975   Mammogram  10/06/2015   DTaP/Tdap/Td vaccine (2 - Td or Tdap) 01/13/2017   Eye exam for diabetics  05/17/2017   DEXA scan (bone density measurement)  Never done   Pneumonia Vaccine (2 of 2 - PCV) 09/08/2022   COVID-19 Vaccine (5 - 2024-25 season) 11/17/2022   Hemoglobin A1C  03/19/2023   Yearly kidney function blood test for diabetes  09/16/2023   Screening for Lung Cancer  10/09/2023   Flu Shot  10/17/2023   Medicare Annual Wellness Visit  08/12/2024   Cologuard (Stool DNA test)  09/29/2025   Hepatitis C Screening  Completed   HPV Vaccine  Aged Out   Meningitis B Vaccine  Aged Out    Advanced directives: (ACP Link)Information on Advanced Care Planning can be found at Theatre manager Advance Health Care Directives Advance Health Care Directives. [email or web address]  Advance Care Planning is important because it:  [x]  Makes sure you receive the medical care that is consistent with your values, goals, and preferences  [x]  It provides guidance to your family and loved ones and reduces their decisional burden about whether or not they are making the right decisions based on your wishes.  Follow the  link provided in your after visit summary or read over the paperwork we have mailed to you to help you started getting your Advance Directives in place. If you need assistance in completing these, please reach out to us  so that we can help you!

## 2023-08-15 ENCOUNTER — Other Ambulatory Visit: Payer: Self-pay | Admitting: Medical Genetics

## 2023-08-20 ENCOUNTER — Encounter: Admitting: Family Medicine

## 2023-08-20 ENCOUNTER — Other Ambulatory Visit

## 2023-08-29 ENCOUNTER — Other Ambulatory Visit: Payer: Self-pay | Admitting: Family Medicine

## 2023-08-29 DIAGNOSIS — M51379 Other intervertebral disc degeneration, lumbosacral region without mention of lumbar back pain or lower extremity pain: Secondary | ICD-10-CM

## 2023-08-29 NOTE — Telephone Encounter (Signed)
 Copied from CRM [id number]. Topic: Clinical - Medication Refill >> Aug 29, 2023 10:06 AM [name] wrote: Medication: HYDROcodone -acetaminophen  (NORCO/VICODIN) 5-325 MG tablet  Has the patient contacted their pharmacy? Yes Controlled substance have to call PCP  This is the patient's preferred pharmacy:  [facility] - [place], [place] - [address] [place] [address] Phone: [phone] Fax: [phone]    Is this the correct pharmacy for this prescription? Yes If no, delete pharmacy and type the correct one.   Has the prescription been filled recently? Yes  Is the patient out of the medication? Yes  Has the patient been seen for an appointment in the last year OR does the patient have an upcoming appointment? Yes  Can we respond through MyChart? Yes  Agent: Please be advised that Rx refills may take up to 3 business days. We ask that you follow-up with your pharmacy.

## 2023-09-01 ENCOUNTER — Other Ambulatory Visit: Payer: Self-pay | Admitting: Family Medicine

## 2023-09-01 DIAGNOSIS — N951 Menopausal and female climacteric states: Secondary | ICD-10-CM

## 2023-09-01 DIAGNOSIS — F419 Anxiety disorder, unspecified: Secondary | ICD-10-CM

## 2023-09-01 DIAGNOSIS — F32A Depression, unspecified: Secondary | ICD-10-CM

## 2023-09-01 NOTE — Telephone Encounter (Signed)
 Requested medications are due for refill today.  A little too soon  Requested medications are on the active medications list.  yes  Last refill. 08/06/2023 #120 0 rf  Future visit scheduled.   yes  Notes to clinic.  Refill not delegated.    Requested Prescriptions  Pending Prescriptions Disp Refills   HYDROcodone -acetaminophen  (NORCO/VICODIN) 5-325 MG tablet 120 tablet 0    Sig: Take 1-2 tablets by mouth every 6 (six) hours as needed for moderate pain (pain score 4-6). PATIENT NEEDS OFFICE VISIT FOR FOLLOW UP     Not Delegated - Analgesics:  Opioid Agonist Combinations Failed - 09/01/2023  2:11 PM      Failed - This refill cannot be delegated      Failed - Urine Drug Screen completed in last 360 days      Passed - Valid encounter within last 3 months    Recent Outpatient Visits   None

## 2023-09-02 ENCOUNTER — Other Ambulatory Visit: Payer: Self-pay

## 2023-09-02 ENCOUNTER — Other Ambulatory Visit: Payer: Self-pay | Admitting: Family Medicine

## 2023-09-02 ENCOUNTER — Telehealth: Payer: Self-pay

## 2023-09-02 DIAGNOSIS — M51379 Other intervertebral disc degeneration, lumbosacral region without mention of lumbar back pain or lower extremity pain: Secondary | ICD-10-CM

## 2023-09-02 DIAGNOSIS — I10 Essential (primary) hypertension: Secondary | ICD-10-CM

## 2023-09-02 NOTE — Telephone Encounter (Signed)
 Patient was told she needed to make appointment.  She set one up for 09/22/23. Refill went to Dr Shann Darnel but he has not addressed and will now be out of the office until Friday.

## 2023-09-02 NOTE — Telephone Encounter (Signed)
 Tried to call patient and there was no answer, no VM set up. Patient has not been seen since July 2024.  Needs to be seen for refills. Can not do a letter without seeing the patient.      Copied from CRM [id number]. Topic: General - Other >> Sep 01, 2023  3:04 PM Carlatta H wrote: Reason for CRM: Patient would like a call back from the nurse in regards to a airline ticket note she needs from doctor Fisher >> Sep 02, 2023 10:22 AM Emylou G wrote: Needs to request a letter for missed appt ( for her airline issue ) and her refill keeps getting lost.. Pls call patient as soon as possible.. Patient says to call before 1130 or after lunch

## 2023-09-02 NOTE — Telephone Encounter (Signed)
 Patient advised that she will need to discuss letter with Dr Shann Darnel at her next appointment.  Refill request has been generated and sent to another provider since Dr F is out

## 2023-09-03 ENCOUNTER — Telehealth: Payer: Self-pay

## 2023-09-03 NOTE — Telephone Encounter (Signed)
 I spoke with this patient and told her that since we have not seen her we can not write a note saying she was too sick to travel.  She will need to discuss with Dr Shann Darnel when he returns on Friday.  Patient was told we would send a refill request in but she would probably need to wait until Friday for Dr Shann Darnel to return    Copied from CRM [id number]. Topic: General - Other >> Sep 01, 2023  3:04 PM Carlatta H wrote: Reason for CRM: Patient would like a call back from the nurse in regards to a airline ticket note she needs from doctor Fisher >> Sep 03, 2023  1:26 PM Lizabeth Riggs wrote: She is VERY tired and wants to be checked out. She thinks she might need a B 12 shot.  She also needs a letter stating she is sick to turn into the airlines by Monday or she will have to pay for a ticket she did not use. There is no appointment until July.  Please call her at [phone] to try to work her in for an appointment.  >> Sep 02, 2023 10:22 AM Emylou G wrote: Needs to request a letter for missed appt ( for her airline issue ) and her refill keeps getting lost.. Pls call patient as soon as possible.. Patient says to call before 1130 or after lunch

## 2023-09-03 NOTE — Telephone Encounter (Signed)
 Requested Prescriptions  Pending Prescriptions Disp Refills   losartan -hydrochlorothiazide (HYZAAR) 100-25 MG tablet [Pharmacy Med Name: Losartan  Potassium-HCTZ Oral Tablet 100-25 MG] 90 tablet 1    Sig: TAKE 1 TABLET EVERY DAY. PATIENT NEEDS TO SCHEDULE OFFICE VISIT FOR FOLLOW UP.     There is no refill protocol information for this order     rosuvastatin  (CRESTOR ) 10 MG tablet [Pharmacy Med Name: Rosuvastatin  Calcium  Oral Tablet 10 MG] 90 tablet 1    Sig: TAKE 1 TABLET EVERY DAY     There is no refill protocol information for this order     amLODipine  (NORVASC ) 2.5 MG tablet [Pharmacy Med Name: amLODIPine  Besylate Oral Tablet 2.5 MG] 90 tablet 1    Sig: TAKE 1 TABLET EVERY DAY     There is no refill protocol information for this order     fluticasone  (FLONASE ) 50 MCG/ACT nasal spray [Pharmacy Med Name: Fluticasone  Propionate Nasal Suspension 50 MCG/ACT] 48 g 3    Sig: PLACE 2 SPRAYS INTO BOTH NOSTRILS DAILY.     There is no refill protocol information for this order

## 2023-09-04 ENCOUNTER — Ambulatory Visit: Payer: Self-pay

## 2023-09-04 NOTE — Telephone Encounter (Signed)
 FYI Only or Action Required?: FYI only for provider.  Patient was last seen in primary care on 09/16/2022 by Lamon Pillow, MD. Called Nurse Triage reporting Dizziness. Symptoms began several weeks ago. Interventions attempted: Nothing. Symptoms are: unchanged.  Triage Disposition: See Physician Within 24 Hours  Patient/caregiver understands and will follow disposition?: Yes   Copied from CRM [id number]. Topic: Clinical - Red Word Triage >> Sep 04, 2023  8:27 AM El Gravely T wrote: [facility] that prompted transfer to Nurse Triage: Dizziness, fatigue, weakness, headaches, and diarrhea Reason for Disposition  [1] MODERATE dizziness (e.g., interferes with normal activities) AND [2] has NOT been evaluated by doctor (or NP/PA) for this  (Exception: Dizziness caused by heat exposure, sudden standing, or poor fluid intake.)  Answer Assessment - Initial Assessment Questions 1. DESCRIPTION: Describe your dizziness.     lightheaded 2. LIGHTHEADED: Do you feel lightheaded? (e.g., somewhat faint, woozy, weak upon standing)     Weak constant 3. VERTIGO: Do you feel like either you or the room is spinning or tilting? (i.e. vertigo)     Hx chronic fatigue syndrome 4. SEVERITY: How bad is it?  Do you feel like you are going to faint? Can you stand and walk?   - MILD: Feels slightly dizzy, but walking normally.   - MODERATE: Feels unsteady when walking, but not falling; interferes with normal activities (e.g., school, work).   - SEVERE: Unable to walk without falling, or requires assistance to walk without falling; feels like passing out now.      severe 5. ONSET:  When did the dizziness begin?     Two weeks ago 6. AGGRAVATING FACTORS: Does anything make it worse? (e.g., standing, change in head position)     standing 7. HEART RATE: Can you tell me your heart rate? How many beats in 15 seconds?  (Note: not all patients can do this)       no 8. CAUSE: What do you think is causing the  dizziness?     Chronic fatigue symptom 9. RECURRENT SYMPTOM: Have you had dizziness before? If Yes, ask: When was the last time? What happened that time?     na 10. OTHER SYMPTOMS: Do you have any other symptoms? (e.g., fever, chest pain, vomiting, diarrhea, bleeding)       Fatigue, weakness, headaches, diarrhea 11. PREGNANCY: Is there any chance you are pregnant? When was your last menstrual period?       na  Protocols used: Dizziness - Lightheadedness-A-AH

## 2023-09-05 ENCOUNTER — Other Ambulatory Visit: Payer: Self-pay | Admitting: Family Medicine

## 2023-09-05 ENCOUNTER — Encounter: Payer: Self-pay | Admitting: Family Medicine

## 2023-09-05 ENCOUNTER — Ambulatory Visit (INDEPENDENT_AMBULATORY_CARE_PROVIDER_SITE_OTHER): Admitting: Family Medicine

## 2023-09-05 ENCOUNTER — Ambulatory Visit: Admitting: Family Medicine

## 2023-09-05 VITALS — BP 134/72 | HR 58 | Ht 67.5 in | Wt 239.2 lb

## 2023-09-05 DIAGNOSIS — J3089 Other allergic rhinitis: Secondary | ICD-10-CM | POA: Diagnosis not present

## 2023-09-05 DIAGNOSIS — E119 Type 2 diabetes mellitus without complications: Secondary | ICD-10-CM

## 2023-09-05 DIAGNOSIS — G9332 Myalgic encephalomyelitis/chronic fatigue syndrome: Secondary | ICD-10-CM

## 2023-09-05 DIAGNOSIS — G8929 Other chronic pain: Secondary | ICD-10-CM

## 2023-09-05 DIAGNOSIS — M545 Low back pain, unspecified: Secondary | ICD-10-CM

## 2023-09-05 DIAGNOSIS — M25561 Pain in right knee: Secondary | ICD-10-CM

## 2023-09-05 DIAGNOSIS — R5382 Chronic fatigue, unspecified: Secondary | ICD-10-CM

## 2023-09-05 DIAGNOSIS — R079 Chest pain, unspecified: Secondary | ICD-10-CM

## 2023-09-05 DIAGNOSIS — Z7984 Long term (current) use of oral hypoglycemic drugs: Secondary | ICD-10-CM

## 2023-09-05 MED ORDER — FEXOFENADINE HCL 180 MG PO TABS: 180.0000 mg | ORAL_TABLET | Freq: Every day | ORAL | 3 refills | Status: AC

## 2023-09-05 MED ORDER — METFORMIN HCL ER 500 MG PO TB24
1000.0000 mg | ORAL_TABLET | Freq: Every day | ORAL | 4 refills | Status: AC
Start: 2023-09-05 — End: ?

## 2023-09-05 MED ORDER — TRAMADOL-ACETAMINOPHEN 37.5-325 MG PO TABS
1.0000 | ORAL_TABLET | Freq: Four times a day (QID) | ORAL | 0 refills | Status: AC | PRN
Start: 2023-09-05 — End: ?

## 2023-09-05 MED ORDER — NITROGLYCERIN 0.4 MG SL SUBL
0.4000 mg | SUBLINGUAL_TABLET | SUBLINGUAL | 2 refills | Status: AC | PRN
Start: 2023-09-05 — End: ?

## 2023-09-05 NOTE — Telephone Encounter (Signed)
 Copied from CRM [id number]. Topic: Clinical - Medication Refill >> Sep 05, 2023  9:24 AM Alysia Jumbo S wrote: Medication:  metFORMIN  (GLUCOPHAGE -XR) 500 MG 24 hr tablet  Has the patient contacted their pharmacy? Yes (Agent: If no, request that the patient contact the pharmacy for the refill. If patient does not wish to contact the pharmacy document the reason why and proceed with request.) (Agent: If yes, when and what did the pharmacy advise?)  This is the patient's preferred pharmacy:  [facility] - [place], [place] - [address] [place] [address] Phone: [phone] Fax: [phone]    Is this the correct pharmacy for this prescription? Yes If no, delete pharmacy and type the correct one.   Has the prescription been filled recently? Yes  Is the patient out of the medication? Yes  Has the patient been seen for an appointment in the last year OR does the patient have an upcoming appointment? Yes  Can we respond through MyChart? No  Agent: Please be advised that Rx refills may take up to 3 business days. We ask that you follow-up with your pharmacy.

## 2023-09-05 NOTE — Progress Notes (Signed)
 Established patient visit   Patient: Misty Bishop   DOB: 1955-07-14   68 y.o. Female  MRN: [id number] Visit Date: 09/05/2023  Today's healthcare provider: LAURAINE LOISE BUOY, DO   Chief Complaint  Patient presents with   Dizziness    Patient reports that is coming from chronic fatigued. She would like to have some type of B vitamin to help her fatigue   Subjective    Dizziness  Misty Bishop is a 68 year old female with chronic fatigue syndrome who presents with worsening fatigue and dizziness.  She experiences significant fatigue and dizziness, describing a sensation of being off balance and foggy. These symptoms worsen with quick movements, occasionally causing her to stumble. She has a long-standing history of chronic fatigue syndrome (CFS) since the first [place], which she has managed over the years. Recently, she experienced a 'crash' in her condition, attributing it to physical and emotional stress from caring for a friend with severe disabilities.  She reports joint pain and a sore throat as part of her symptomatology. Additionally, she experiences forgetfulness and disorientation, feeling both physically and mentally fatigued. She describes her situation as depressing, with chronic depression exacerbated by current life stressors, including the illness of a close friend.  She has a history of diabetes, managed with metformin , taking one or two pills based on her blood sugar levels. Her blood sugars range between 100 and 120, with a last A1c of 6.0. She notes higher blood sugar levels in the [place]  compared to the [place]  She has a history of low iron levels but has not had a recent iron panel. She also mentions back pain, for which she occasionally uses hydrocodone . She experienced a recent delay in receiving her hydrocodone  prescription, which took 23 days to fulfill.  She experiences headaches, which she believes are  related to her blood pressure and stress. She reports symptoms of a cold, including nasal congestion, attributing them to her weakened immune system due to CFS. She states she has an irregular sinus cavity that retains mucus, managed with Flonase  and Singulair .  She mentions allergies, including reactions to cockroaches and mites, and uses Claritin and Allegra  for symptom control. Her lung doctor advised using Allegra  for inflammation control.  She expresses difficulty managing her health appointments due to fatigue and caregiving demands. She recently missed a flight due to her health condition and requires a doctor's note to address this with the airline.       Medications: Outpatient Medications Prior to Visit  Medication Sig   Accu-Chek FastClix Lancets MISC CHECK BLOOD SUGAR EVERY DAY FOR TYPE 2 DIABETES E11.9   Accu-Chek Softclix Lancets lancets Use to check blood sugar daily for type 2 diabetes. E11.9   albuterol  (VENTOLIN  HFA) 108 (90 Base) MCG/ACT inhaler Inhale 2 puffs into the lungs every 6 (six) hours as needed.   amLODipine  (NORVASC ) 2.5 MG tablet TAKE 1 TABLET EVERY DAY   aspirin 81 MG EC tablet Take 81 mg by mouth daily.   b complex vitamins capsule Take 1 capsule by mouth Once PRN.   clonazePAM  (KLONOPIN ) 1 MG tablet Take 1-2 tablets daily and 2 tablets at bedtime.   diclofenac  (VOLTAREN ) 75 MG EC tablet TAKE 1 TABLET(75 MG) BY MOUTH TWICE DAILY AS NEEDED   diclofenac  Sodium (VOLTAREN ) 1 % GEL APPLY 1 APPLICATION TOPICALLY 4 (FOUR) TIMES DAILY AS NEEDED.   escitalopram  (LEXAPRO ) 20 MG tablet TAKE 1 TABLET EVERY DAY  estradiol  (ESTRACE ) 0.5 MG tablet TAKE 1 TABLET EVERY DAY   fluticasone  (FLONASE ) 50 MCG/ACT nasal spray PLACE 2 SPRAYS INTO BOTH NOSTRILS DAILY.   glucose blood (ACCU-CHEK AVIVA PLUS) test strip Use to check blood sugar once a day for type 2 diabetes e11.9   losartan -hydrochlorothiazide (HYZAAR) 100-25 MG tablet TAKE 1 TABLET EVERY DAY. PATIENT NEEDS TO  SCHEDULE OFFICE VISIT FOR FOLLOW UP.   medroxyPROGESTERone  (PROVERA ) 2.5 MG tablet TAKE 1 TABLET EVERY DAY   montelukast  (SINGULAIR ) 10 MG tablet Take 1 tablet (10 mg total) by mouth daily.   Omega 3-6-9 Fatty Acids (OMEGA 3-6-9 COMPLEX) CAPS Take 1 capsule by mouth daily.   Probiotic Product (PROBIOTIC PO) Take 3 tablets by mouth daily.   ranitidine  (ZANTAC ) 150 MG capsule Take 1 capsule (150 mg total) by mouth 2 (two) times daily.   rosuvastatin  (CRESTOR ) 10 MG tablet TAKE 1 TABLET EVERY DAY   sucralfate  (CARAFATE ) 1 g tablet Take 1 tablet (1 g total) by mouth 2 (two) times daily.   valACYclovir  (VALTREX ) 1000 MG tablet Take 1 tablet (1,000 mg total) by mouth daily.   vitamin C (ASCORBIC ACID) 250 MG tablet Take 4 tablets by mouth daily.   [DISCONTINUED] Blood Glucose Monitoring Suppl (ACCU-CHEK AVIVA PLUS) w/Device KIT Use to check blood sugar daily for type 2 diabetes. E11.9   [DISCONTINUED] fluconazole  (DIFLUCAN ) 150 MG tablet Take 1 tablet (150 mg total) by mouth every 3 (three) days. For three doses   [DISCONTINUED] HYDROcodone -acetaminophen  (NORCO/VICODIN) 5-325 MG tablet Take 1-2 tablets by mouth every 6 (six) hours as needed for moderate pain (pain score 4-6). PATIENT NEEDS OFFICE VISIT FOR FOLLOW UP   [DISCONTINUED] loratadine (CLARITIN) 10 MG tablet Take 1 tablet by mouth daily.   [DISCONTINUED] metFORMIN  (GLUCOPHAGE -XR) 500 MG 24 hr tablet Take 2 tablets (1,000 mg total) by mouth daily with breakfast.   [DISCONTINUED] nitroGLYCERIN  (NITROSTAT ) 0.4 MG SL tablet Place 1 tablet (0.4 mg total) under the tongue every 5 (five) minutes as needed for chest pain (Up to 3 tablets).   [DISCONTINUED] traMADol -acetaminophen  (ULTRACET ) 37.5-325 MG tablet Take 1 tablet by mouth every 6 (six) hours as needed.   [DISCONTINUED] dexlansoprazole  (DEXILANT ) 60 MG capsule Take 1 capsule (60 mg total) by mouth daily.   [DISCONTINUED] meloxicam  (MOBIC ) 15 MG tablet TAKE 1 TABLET EVERY DAY AS NEEDED   No  facility-administered medications prior to visit.         Objective    BP 134/72 (BP Location: Left Arm, Patient Position: Sitting, Cuff Size: Large)   Pulse (!) 58   Ht 5' 7.5 (1.715 m)   Wt 239 lb 3.2 oz (108.5 kg)   SpO2 98%   BMI 36.91 kg/m     Physical Exam Vitals and nursing note reviewed.  Constitutional:      General: She is not in acute distress.    Appearance: Normal appearance.  HENT:     Head: Normocephalic and atraumatic.   Eyes:     General: No scleral icterus.    Conjunctiva/sclera: Conjunctivae normal.    Cardiovascular:     Rate and Rhythm: Normal rate.  Pulmonary:     Effort: Pulmonary effort is normal.   Neurological:     Mental Status: She is alert and oriented to person, place, and time. Mental status is at baseline.   Psychiatric:        Mood and Affect: Mood normal.        Behavior: Behavior normal.  Results for orders placed or performed in visit on 09/05/23  Vitamin B12  Result Value Ref Range   Vitamin B-12 531 232 - 1,245 pg/mL  Vitamin B6  Result Value Ref Range   Vitamin B6 31.2 3.4 - 65.2 ug/L  VITAMIN D  25 Hydroxy (Vit-D Deficiency, Fractures)  Result Value Ref Range   Vit D, 25-Hydroxy 31.9 30.0 - 100.0 ng/mL  Vitamin E  Result Value Ref Range   Vitamin E (Alpha Tocopherol) 14.3 9.0 - 29.0 mg/L   Vitamin E(Gamma Tocopherol) 1.4 0.5 - 4.9 mg/L  Iron, TIBC and Ferritin Panel  Result Value Ref Range   Total Iron Binding Capacity 249 (L) 250 - 450 ug/dL   UIBC 861 881 - 630 ug/dL   Iron 888 27 - 860 ug/dL   Iron Saturation 45 15 - 55 %   Ferritin 432 (H) 15 - 150 ng/mL  Hemoglobin A1c  Result Value Ref Range   Hgb A1c MFr Bld 6.8 (H) 4.8 - 5.6 %   Est. average glucose Bld gHb Est-mCnc 148 mg/dL  Zinc   Result Value Ref Range   Zinc  71 44 - 115 ug/dL  Folate  Result Value Ref Range   Folate 15.2 >3.0 ng/mL    Assessment & Plan    Myalgic encephalomyelitis/chronic fatigue syndrome (ME/CFS) -     Vitamin  B12 -     Vitamin B6 -     VITAMIN D  25 Hydroxy (Vit-D Deficiency, Fractures) -     Vitamin E -     Iron, TIBC and Ferritin Panel -     Zinc  -     Folate  Chronic bilateral low back pain, unspecified whether sciatica present  Type 2 diabetes mellitus without complication, without long-term current use of insulin (HCC) -     Hemoglobin A1c  Non-seasonal allergic rhinitis due to other allergic trigger -     Fexofenadine  HCl; Take 1 tablet (180 mg total) by mouth daily.  Dispense: 90 tablet; Refill: 3  Chronic pain of right knee -     traMADol -Acetaminophen ; Take 1 tablet by mouth every 6 (six) hours as needed.  Dispense: 180 tablet; Refill: 0  Chest pain, unspecified type -     Nitroglycerin ; Place 1 tablet (0.4 mg total) under the tongue every 5 (five) minutes as needed for chest pain (Up to 3 tablets).  Dispense: 30 tablet; Refill: 2     Chronic Fatigue Syndrome (CFS) CFS exacerbated by stress, causing fatigue, dizziness, joint pain, sore throat, and cognitive issues. Current episode described as a 'crash'.  Discussed that B12 injections are only appropriate if her level is identified as low. - Order vitamin levels to evaluate for metabolic sources: E, D, B6, B12, folate, zinc , iron panel. - Provide airline letter for inability to fly due to ME/CFS. - Discuss B12 injection if levels low. - Advise continuation of oral B12 supplementation.  Back Pain Chronic back pain worsened by physical strain from caregiving. Prefers tramadol  for sleep aid. Reports pharmacy issues with hydrocodone . - Refill tramadol  prescriptions.  Type 2 Diabetes Mellitus Type 2 diabetes well-managed with metformin .  Last A1c 6.0%. Interested in natural alternatives and dietary options. - Rechecking A1c today - Continue metformin   Nonseasonal Allergic Rhinitis Allergic rhinitis with sinus congestion. Current medications include Flonase , Singulair , Claritin. Recently prescribed Allegra  for better  management. - Send prescription for Allegra  to pharmacy.    Return in about 17 days (around 09/22/2023) for Chronic f/u w/PCP as scheduled.  I discussed the assessment and treatment plan with the patient  The patient was provided an opportunity to ask questions and all were answered. The patient agreed with the plan and demonstrated an understanding of the instructions.   The patient was advised to call back or seek an in-person evaluation if the symptoms worsen or if the condition fails to improve as anticipated.    LAURAINE LOISE BUOY, DO  [facility] [phone] (phone) [phone] (fax)  [facility]

## 2023-09-06 ENCOUNTER — Other Ambulatory Visit: Payer: Self-pay | Admitting: Family Medicine

## 2023-09-06 DIAGNOSIS — E119 Type 2 diabetes mellitus without complications: Secondary | ICD-10-CM

## 2023-09-06 DIAGNOSIS — B379 Candidiasis, unspecified: Secondary | ICD-10-CM

## 2023-09-07 MED ORDER — HYDROCODONE-ACETAMINOPHEN 5-325 MG PO TABS
1.0000 | ORAL_TABLET | Freq: Four times a day (QID) | ORAL | 0 refills | Status: DC | PRN
Start: 2023-09-07 — End: 2023-10-02

## 2023-09-11 ENCOUNTER — Telehealth: Payer: Self-pay | Admitting: Family Medicine

## 2023-09-11 LAB — FOLATE: Folate: 15.2 ng/mL (ref >3.0)

## 2023-09-11 LAB — HEMOGLOBIN A1C
Est. average glucose Bld gHb Est-mCnc: 148 mg/dL
Hgb A1c MFr Bld: 6.8 % — ABNORMAL HIGH (ref 4.8–5.6)

## 2023-09-11 LAB — VITAMIN D 25 HYDROXY (VIT D DEFICIENCY, FRACTURES): Vit D, 25-Hydroxy: 31.9 ng/mL (ref 30.0–100.0)

## 2023-09-11 LAB — VITAMIN E
Vitamin E (Alpha Tocopherol): 14.3 mg/L (ref 9.0–29.0)
Vitamin E(Gamma Tocopherol): 1.4 mg/L (ref 0.5–4.9)

## 2023-09-11 LAB — IRON,TIBC AND FERRITIN PANEL
Ferritin: 432 ng/mL — ABNORMAL HIGH (ref 15–150)
Iron Saturation: 45 % (ref 15–55)
Iron: 111 ug/dL (ref 27–139)
Total Iron Binding Capacity: 249 ug/dL — ABNORMAL LOW (ref 250–450)
UIBC: 138 ug/dL (ref 118–369)

## 2023-09-11 LAB — VITAMIN B12: Vitamin B-12: 531 pg/mL (ref 232–1245)

## 2023-09-11 LAB — ZINC: Zinc: 71 ug/dL (ref 44–115)

## 2023-09-11 LAB — VITAMIN B6: Vitamin B6: 31.2 ug/L (ref 3.4–65.2)

## 2023-09-11 NOTE — Telephone Encounter (Addendum)
 Clarification needed for [facility]-RX fot Tramadol /APAP 37.5-325MG , Dated 6/20. Active RX for Norco 5-325MG , Dated 6/22(8/D Dr. JONETTA Perry). Please Clarify if current TX is Tramadol /APAP 37.5-325MG  TAB or Norco 5-325MG .(DUR)   Document was placed in PCP mailbox

## 2023-09-15 ENCOUNTER — Ambulatory Visit: Payer: Self-pay | Admitting: Family Medicine

## 2023-09-22 ENCOUNTER — Encounter: Admitting: Family Medicine

## 2023-09-23 ENCOUNTER — Telehealth: Admitting: Family Medicine

## 2023-09-23 ENCOUNTER — Encounter: Payer: Self-pay | Admitting: Family Medicine

## 2023-09-23 DIAGNOSIS — J309 Allergic rhinitis, unspecified: Secondary | ICD-10-CM | POA: Diagnosis not present

## 2023-09-23 DIAGNOSIS — J411 Mucopurulent chronic bronchitis: Secondary | ICD-10-CM

## 2023-09-23 DIAGNOSIS — J329 Chronic sinusitis, unspecified: Secondary | ICD-10-CM

## 2023-09-23 DIAGNOSIS — J440 Chronic obstructive pulmonary disease with acute lower respiratory infection: Secondary | ICD-10-CM

## 2023-09-23 DIAGNOSIS — J441 Chronic obstructive pulmonary disease with (acute) exacerbation: Secondary | ICD-10-CM

## 2023-09-23 MED ORDER — AMOXICILLIN 875 MG PO TABS: 875.0000 mg | ORAL_TABLET | Freq: Two times a day (BID) | ORAL | 0 refills | Status: AC

## 2023-09-23 MED ORDER — PREDNISONE 20 MG PO TABS
40.0000 mg | ORAL_TABLET | Freq: Every day | ORAL | 0 refills | Status: AC
Start: 2023-09-23 — End: 2023-09-28

## 2023-09-23 NOTE — Progress Notes (Unsigned)
 MyChart Video Visit    Virtual Visit via Video Note   This format is felt to be most appropriate for this patient at this time. Physical exam was limited by quality of the video and audio technology used for the visit.   Patient location: Patient's home address   Provider location: [facility]  [address]  [place], [place] [address]   I discussed the limitations of evaluation and management by telemedicine and the availability of in person appointments. The patient expressed understanding and agreed to proceed.  Patient: Misty Bishop   DOB: [birth year]   68 y.o. Female  MRN: [id number] Visit Date: 09/23/2023  Today's healthcare provider: Rockie Agent, MD   No chief complaint on file.  Subjective    HPI   Discussed the use of AI scribe software for clinical note transcription with the patient, who gave verbal consent to proceed.  History of Present Illness Misty Bishop is a 68 year old female with chronic sinusitis and allergic rhinitis who presents with cough, head congestion, and left ear pain.  Her symptoms began almost a week ago after caring for a friend with a cold confirmed as rhinovirus. She developed a cold herself, which progressed to involve her bronchial passages, as she has a history of bronchitis. She describes a dry cough that is becoming wheezy, head congestion, and left ear pain. She has not been tested for COVID-19 but notes that the two sick individuals she is caring for tested negative.  She manages her chronic sinusitis and allergic rhinitis with Patanase in each nostril once daily as prescribed by her ENT, although she does not use it regularly until she feels unwell. She describes her sinus cavities as inverted, making it easy to hold an infection, and reports a sinus headache, nasal discharge, sneezing, and a feeling of heaviness in her chest. She also experiences a cough at night.  She has a  history of Meniere's disease, type two diabetes, chronic pain, and bipolar disorder. She mentions that she is able to take amoxicillin , which she previously thought she was allergic to, and has been treated with steroids and antibiotics in the past for similar symptoms. She cannot take Levaquin and has had adverse reactions to Cefalexin. She has been over-treated with erythromycin and Z-Pak in the past, which she feels may have reduced her effectiveness.  No recent exposure to COVID-19 outside of her home and she has received all her COVID-19 vaccinations. No symptoms consistent with the new COVID variant, such as a sore throat.      Past Medical History:  Diagnosis Date   Bipolar disorder (HCC)    Chronic gastritis with bleeding    Depression    Diabetes mellitus without complication (HCC)    Headache(784.0)    Hypertension    Palpitations    Holter monitor in 2008 showed PVCs   Peptic ulcer disease    PTSD (post-traumatic stress disorder)     Medications: Outpatient Medications Prior to Visit  Medication Sig   Accu-Chek FastClix Lancets MISC CHECK BLOOD SUGAR EVERY DAY FOR TYPE 2 DIABETES E11.9   Accu-Chek Softclix Lancets lancets Use to check blood sugar daily for type 2 diabetes. E11.9   albuterol  (VENTOLIN  HFA) 108 (90 Base) MCG/ACT inhaler Inhale 2 puffs into the lungs every 6 (six) hours as needed.   amLODipine  (NORVASC ) 2.5 MG tablet TAKE 1 TABLET EVERY DAY   aspirin 81 MG EC tablet Take 81 mg by mouth daily.  b complex vitamins capsule Take 1 capsule by mouth Once PRN.   Blood Glucose Monitoring Suppl (ACCU-CHEK GUIDE ME) w/Device KIT USE AS DIRECTED   clonazePAM  (KLONOPIN ) 1 MG tablet Take 1-2 tablets daily and 2 tablets at bedtime.   diclofenac  (VOLTAREN ) 75 MG EC tablet TAKE 1 TABLET(75 MG) BY MOUTH TWICE DAILY AS NEEDED   diclofenac  Sodium (VOLTAREN ) 1 % GEL APPLY 1 APPLICATION TOPICALLY 4 (FOUR) TIMES DAILY AS NEEDED.   escitalopram  (LEXAPRO ) 20 MG tablet TAKE 1  TABLET EVERY DAY   estradiol  (ESTRACE ) 0.5 MG tablet TAKE 1 TABLET EVERY DAY   fexofenadine  (ALLEGRA ) 180 MG tablet Take 1 tablet (180 mg total) by mouth daily.   fluconazole  (DIFLUCAN ) 150 MG tablet TAKE 1 TABLET (150 MG TOTAL) BY MOUTH EVERY 3 (THREE) DAYS FOR THREE DOSES   fluticasone  (FLONASE ) 50 MCG/ACT nasal spray PLACE 2 SPRAYS INTO BOTH NOSTRILS DAILY.   glucose blood (ACCU-CHEK AVIVA PLUS) test strip Use to check blood sugar once a day for type 2 diabetes e11.9   HYDROcodone -acetaminophen  (NORCO/VICODIN) 5-325 MG tablet Take 1-2 tablets by mouth every 6 (six) hours as needed for moderate pain (pain score 4-6). PATIENT NEEDS OFFICE VISIT FOR FOLLOW UP   losartan -hydrochlorothiazide (HYZAAR) 100-25 MG tablet TAKE 1 TABLET EVERY DAY. PATIENT NEEDS TO SCHEDULE OFFICE VISIT FOR FOLLOW UP.   medroxyPROGESTERone  (PROVERA ) 2.5 MG tablet TAKE 1 TABLET EVERY DAY   metFORMIN  (GLUCOPHAGE -XR) 500 MG 24 hr tablet Take 2 tablets (1,000 mg total) by mouth daily with breakfast.   montelukast  (SINGULAIR ) 10 MG tablet Take 1 tablet (10 mg total) by mouth daily.   nitroGLYCERIN  (NITROSTAT ) 0.4 MG SL tablet Place 1 tablet (0.4 mg total) under the tongue every 5 (five) minutes as needed for chest pain (Up to 3 tablets).   Omega 3-6-9 Fatty Acids (OMEGA 3-6-9 COMPLEX) CAPS Take 1 capsule by mouth daily.   Probiotic Product (PROBIOTIC PO) Take 3 tablets by mouth daily.   ranitidine  (ZANTAC ) 150 MG capsule Take 1 capsule (150 mg total) by mouth 2 (two) times daily.   rosuvastatin  (CRESTOR ) 10 MG tablet TAKE 1 TABLET EVERY DAY   sucralfate  (CARAFATE ) 1 g tablet Take 1 tablet (1 g total) by mouth 2 (two) times daily.   traMADol -acetaminophen  (ULTRACET ) 37.5-325 MG tablet Take 1 tablet by mouth every 6 (six) hours as needed.   valACYclovir  (VALTREX ) 1000 MG tablet Take 1 tablet (1,000 mg total) by mouth daily.   vitamin C (ASCORBIC ACID) 250 MG tablet Take 4 tablets by mouth daily.   No facility-administered  medications prior to visit.    Review of Systems  Last metabolic panel Lab Results  Component Value Date   GLUCOSE 202 (H) 09/16/2022   NA 139 09/16/2022   K 3.8 09/16/2022   CL 100 09/16/2022   CO2 26 09/16/2022   BUN 16 09/16/2022   CREATININE 0.86 09/16/2022   EGFR 74 09/16/2022   CALCIUM  9.0 09/16/2022   PROT 6.8 09/16/2022   ALBUMIN 4.2 09/16/2022   LABGLOB 2.6 09/16/2022   AGRATIO 1.6 05/14/2021   BILITOT 0.3 09/16/2022   ALKPHOS 59 09/16/2022   AST 16 09/16/2022   ALT 17 09/16/2022   ANIONGAP 8 07/06/2017   Last lipids Lab Results  Component Value Date   CHOL 199 09/16/2022   HDL 36 (L) 09/16/2022   LDLCALC 115 (H) 09/16/2022   TRIG 274 (H) 09/16/2022   CHOLHDL 5.5 (H) 09/16/2022   Last hemoglobin A1c Lab Results  Component Value Date  HGBA1C 6.8 (H) 09/05/2023        Objective    There were no vitals taken for this visit.  BP Readings from Last 3 Encounters:  09/05/23 134/72  10/09/22 124/71  09/16/22 117/75   Wt Readings from Last 3 Encounters:  09/05/23 239 lb 3.2 oz (108.5 kg)  09/16/22 226 lb (102.5 kg)  08/07/22 238 lb (108 kg)        Physical Exam Constitutional:      Appearance: She is ill-appearing. She is not toxic-appearing.  HENT:     Head:     Comments: Speech consistent with congestion  Pulmonary:     Effort: Pulmonary effort is normal.     Comments: Normal work of breathing on RA, intermittent dry cough  Neurological:     Mental Status: She is alert.        Assessment & Plan     Problem List Items Addressed This Visit       Respiratory   COPD (chronic obstructive pulmonary disease) (HCC)   Relevant Medications   predniSONE  (DELTASONE ) 20 MG tablet   Other Visit Diagnoses       COPD exacerbation (HCC)    -  Primary   Relevant Medications   predniSONE  (DELTASONE ) 20 MG tablet        Assessment & Plan Acute Bronchitis Symptoms consistent with acute bronchitis, including dry cough, head congestion,  left ear pain, chest heaviness, and wheezing for nearly a week. Negative COVID-19 tests for her and household contacts. Susceptible to bronchitis with current symptoms aligning with typical episodes. Chronic sinusitis and allergic rhinitis may contribute. Prefers amoxicillin  over doxycycline due to past antibiotic experiences. - Prescribe amoxicillin  875 mg twice daily for 5 days - Prescribe prednisone  40 mg daily for 5 days - Send prescriptions to [facility] in [place] - Advise follow-up with Doctor Gasper if symptoms persist after 5 days  Chronic Sinusitis and Allergic Rhinitis Chronic sinusitis and allergic rhinitis may exacerbate respiratory symptoms. Reports nasal discharge, sneezing, and sinus headache, consistent with sinus issues. Irregular use of Patanase and Flonase  until symptoms arise.     Return if symptoms worsen or fail to improve.     I discussed the assessment and treatment plan with the patient. The patient was provided an opportunity to ask questions and all were answered. The patient agreed with the plan and demonstrated an understanding of the instructions.   The patient was advised to call back or seek an in-person evaluation if the symptoms worsen or if the condition fails to improve as anticipated.  I provided 25 minutes of non-face-to-face time during this encounter.   Rockie Agent, MD [facility] [phone] (phone) [phone] (fax)  [facility]

## 2023-10-01 ENCOUNTER — Other Ambulatory Visit: Payer: Self-pay | Admitting: Family Medicine

## 2023-10-01 DIAGNOSIS — M51379 Other intervertebral disc degeneration, lumbosacral region without mention of lumbar back pain or lower extremity pain: Secondary | ICD-10-CM

## 2023-10-01 NOTE — Telephone Encounter (Signed)
 Copied from CRM [id number]. Topic: Clinical - Medication Refill >> Oct 01, 2023 12:19 PM Winona R wrote: Medication: HYDROcodone -acetaminophen  (NORCO/VICODIN) 5-325 MG tablet [[id number]]  Has the patient contacted their pharmacy? No pharmacy tells pt to call pcp (Agent: If no, request that the patient contact the pharmacy for the refill. If patient does not wish to contact the pharmacy document the reason why and proceed with request.) (Agent: If yes, when and what did the pharmacy advise?)  This is the patient's preferred pharmacy:  [facility] - [place], [place] - [address] [place] [address] Phone: [phone] Fax: [phone]   Is this the correct pharmacy for this prescription? Yes If no, delete pharmacy and type the correct one.   Has the prescription been filled recently? Yes  Is the patient out of the medication? Yes  Has the patient been seen for an appointment in the last year OR does the patient have an upcoming appointment? Yes  Can we respond through MyChart? Yes  Agent: Please be advised that Rx refills may take up to 3 business days. We ask that you follow-up with your pharmacy.

## 2023-10-02 ENCOUNTER — Telehealth: Payer: Self-pay

## 2023-10-02 MED ORDER — HYDROCODONE-ACETAMINOPHEN 5-325 MG PO TABS: 1.0000 | ORAL_TABLET | Freq: Four times a day (QID) | ORAL | 0 refills | Status: AC | PRN

## 2023-10-02 NOTE — Telephone Encounter (Signed)
 Copied from CRM [id number]. Topic: Clinical - Medication Refill >> Oct 01, 2023 12:19 PM Winona R wrote: Medication: HYDROcodone -acetaminophen  (NORCO/VICODIN) 5-325 MG tablet [[id number]]  Has the patient contacted their pharmacy? No pharmacy tells pt to call pcp (Agent: If no, request that the patient contact the pharmacy for the refill. If patient does not wish to contact the pharmacy document the reason why and proceed with request.) (Agent: If yes, when and what did the pharmacy advise?)  This is the patient's preferred pharmacy:  [facility] - [place], [place] - [address] [place] [address] Phone: [phone] Fax: [phone]   Is this the correct pharmacy for this prescription? Yes If no, delete pharmacy and type the correct one.   Has the prescription been filled recently? Yes  Is the patient out of the medication? Yes  Has the patient been seen for an appointment in the last year OR does the patient have an upcoming appointment? Yes  Can we respond through MyChart? Yes  Agent: Please be advised that Rx refills may take up to 3 business days. We ask that you follow-up with your pharmacy. >> Oct 02, 2023 11:45 AM Gustabo D wrote: Patient calling to get the status of prescription

## 2023-10-02 NOTE — Addendum Note (Signed)
 Addended by: GASPER NANCYANN BRAVO on: 10/02/2023 01:52 PM   Modules accepted: Orders

## 2023-11-14 ENCOUNTER — Other Ambulatory Visit: Payer: Self-pay | Admitting: Family Medicine

## 2023-11-14 DIAGNOSIS — N951 Menopausal and female climacteric states: Secondary | ICD-10-CM

## 2023-12-17 ENCOUNTER — Telehealth: Payer: Self-pay

## 2023-12-17 NOTE — Telephone Encounter (Unsigned)
 Copied from CRM [id number]. Topic: Clinical - Lab/Test Results >> Dec 17, 2023  8:54 AM Willma SAUNDERS wrote: Reason for CRM: Alan from [employer] stating additional diagnosis code for labs done on 09/05/2023.  Alan can be reached at [phone]

## 2023-12-19 NOTE — Telephone Encounter (Signed)
 I didn't see her on 6/20, Dr. Donzella did. And which labs? They usually send form to the ordering provider stating which labs needs additional diagnoses codes. It needs to be sent to Dr. Donzella

## 2023-12-23 NOTE — Progress Notes (Signed)
 Misty Bishop [place]                                          MRN: [id number]   12/23/2023   The [facility] Quality Team Specialist reviewed this patient medical record for the purposes of chart review for care gap closure. The following were reviewed: chart review for care gap closure-kidney health evaluation for diabetes:eGFR  and uACR.    [facility] Quality Team

## 2024-01-14 ENCOUNTER — Other Ambulatory Visit: Payer: Self-pay | Admitting: Medical Genetics

## 2024-01-14 DIAGNOSIS — Z006 Encounter for examination for normal comparison and control in clinical research program: Secondary | ICD-10-CM

## 2024-01-19 ENCOUNTER — Other Ambulatory Visit: Payer: Self-pay | Admitting: Family Medicine

## 2024-01-19 DIAGNOSIS — E119 Type 2 diabetes mellitus without complications: Secondary | ICD-10-CM

## 2024-02-28 ENCOUNTER — Other Ambulatory Visit: Payer: Self-pay | Admitting: Family Medicine

## 2024-08-24 ENCOUNTER — Ambulatory Visit
